# Patient Record
Sex: Female | Born: 1935 | Race: White | Hispanic: No | Smoking: Never smoker
Health system: Southern US, Community
[De-identification: ages and names within clinical notes are randomized; demographics above are authoritative.]

## PROBLEM LIST (undated history)

## (undated) DIAGNOSIS — E785 Hyperlipidemia, unspecified: Secondary | ICD-10-CM

## (undated) DIAGNOSIS — F329 Major depressive disorder, single episode, unspecified: Secondary | ICD-10-CM

## (undated) DIAGNOSIS — I10 Essential (primary) hypertension: Secondary | ICD-10-CM

## (undated) DIAGNOSIS — R42 Dizziness and giddiness: Secondary | ICD-10-CM

## (undated) DIAGNOSIS — I2510 Atherosclerotic heart disease of native coronary artery without angina pectoris: Secondary | ICD-10-CM

## (undated) DIAGNOSIS — F3289 Other specified depressive episodes: Secondary | ICD-10-CM

## (undated) DIAGNOSIS — K5730 Diverticulosis of large intestine without perforation or abscess without bleeding: Secondary | ICD-10-CM

## (undated) DIAGNOSIS — M810 Age-related osteoporosis without current pathological fracture: Secondary | ICD-10-CM

## (undated) DIAGNOSIS — F0390 Unspecified dementia without behavioral disturbance: Secondary | ICD-10-CM

## (undated) DIAGNOSIS — B029 Zoster without complications: Secondary | ICD-10-CM

## (undated) DIAGNOSIS — Z9289 Personal history of other medical treatment: Secondary | ICD-10-CM

## (undated) DIAGNOSIS — F068 Other specified mental disorders due to known physiological condition: Secondary | ICD-10-CM

## (undated) DIAGNOSIS — F411 Generalized anxiety disorder: Secondary | ICD-10-CM

## (undated) DIAGNOSIS — I255 Ischemic cardiomyopathy: Secondary | ICD-10-CM

## (undated) HISTORY — DX: Hyperlipidemia, unspecified: E78.5

## (undated) HISTORY — DX: Dizziness and giddiness: R42

## (undated) HISTORY — DX: Generalized anxiety disorder: F41.1

## (undated) HISTORY — DX: Other specified depressive episodes: F32.89

## (undated) HISTORY — DX: Diverticulosis of large intestine without perforation or abscess without bleeding: K57.30

## (undated) HISTORY — DX: Major depressive disorder, single episode, unspecified: F32.9

## (undated) HISTORY — DX: Age-related osteoporosis without current pathological fracture: M81.0

## (undated) HISTORY — DX: Personal history of other medical treatment: Z92.89

## (undated) HISTORY — DX: Essential (primary) hypertension: I10

## (undated) HISTORY — DX: Zoster without complications: B02.9

## (undated) HISTORY — DX: Other specified mental disorders due to known physiological condition: F06.8

## (undated) HISTORY — DX: Atherosclerotic heart disease of native coronary artery without angina pectoris: I25.10

---
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
## 1999-01-06 ENCOUNTER — Other Ambulatory Visit: Admission: RE | Admit: 1999-01-06 | Discharge: 1999-01-06 | Payer: Self-pay | Admitting: Gynecology

## 1999-04-02 ENCOUNTER — Other Ambulatory Visit: Admission: RE | Admit: 1999-04-02 | Discharge: 1999-04-02 | Payer: Self-pay | Admitting: Gynecology

## 1999-05-12 ENCOUNTER — Other Ambulatory Visit: Admission: RE | Admit: 1999-05-12 | Discharge: 1999-05-12 | Payer: Self-pay | Admitting: Gynecology

## 1999-09-09 ENCOUNTER — Other Ambulatory Visit: Admission: RE | Admit: 1999-09-09 | Discharge: 1999-09-09 | Payer: Self-pay | Admitting: Gynecology

## 2000-01-30 ENCOUNTER — Encounter: Payer: Self-pay | Admitting: Gynecology

## 2000-01-30 ENCOUNTER — Encounter: Admission: RE | Admit: 2000-01-30 | Discharge: 2000-01-30 | Payer: Self-pay | Admitting: Gynecology

## 2000-02-05 ENCOUNTER — Other Ambulatory Visit: Admission: RE | Admit: 2000-02-05 | Discharge: 2000-02-05 | Payer: Self-pay | Admitting: Gynecology

## 2000-02-25 ENCOUNTER — Other Ambulatory Visit: Admission: RE | Admit: 2000-02-25 | Discharge: 2000-02-25 | Payer: Self-pay | Admitting: Gynecology

## 2000-02-25 ENCOUNTER — Encounter (INDEPENDENT_AMBULATORY_CARE_PROVIDER_SITE_OTHER): Payer: Self-pay

## 2000-06-21 ENCOUNTER — Other Ambulatory Visit: Admission: RE | Admit: 2000-06-21 | Discharge: 2000-06-21 | Payer: Self-pay | Admitting: Gynecology

## 2000-12-09 ENCOUNTER — Inpatient Hospital Stay (HOSPITAL_COMMUNITY): Admission: EM | Admit: 2000-12-09 | Discharge: 2000-12-15 | Payer: Self-pay | Admitting: Emergency Medicine

## 2000-12-09 ENCOUNTER — Encounter: Payer: Self-pay | Admitting: Internal Medicine

## 2000-12-09 HISTORY — PX: PTCA: SHX146

## 2000-12-10 ENCOUNTER — Encounter: Payer: Self-pay | Admitting: Internal Medicine

## 2000-12-11 ENCOUNTER — Encounter: Payer: Self-pay | Admitting: Internal Medicine

## 2000-12-12 ENCOUNTER — Encounter: Payer: Self-pay | Admitting: Internal Medicine

## 2000-12-21 ENCOUNTER — Other Ambulatory Visit: Admission: RE | Admit: 2000-12-21 | Discharge: 2000-12-21 | Payer: Self-pay | Admitting: Gynecology

## 2001-01-18 ENCOUNTER — Encounter (HOSPITAL_COMMUNITY): Admission: RE | Admit: 2001-01-18 | Discharge: 2001-04-18 | Payer: Self-pay | Admitting: Internal Medicine

## 2001-02-15 ENCOUNTER — Encounter: Admission: RE | Admit: 2001-02-15 | Discharge: 2001-02-15 | Payer: Self-pay | Admitting: Gynecology

## 2001-02-15 ENCOUNTER — Encounter: Payer: Self-pay | Admitting: Gynecology

## 2001-02-18 ENCOUNTER — Encounter: Admission: RE | Admit: 2001-02-18 | Discharge: 2001-02-18 | Payer: Self-pay | Admitting: Gynecology

## 2001-02-18 ENCOUNTER — Encounter: Payer: Self-pay | Admitting: Gynecology

## 2001-06-23 ENCOUNTER — Other Ambulatory Visit: Admission: RE | Admit: 2001-06-23 | Discharge: 2001-06-23 | Payer: Self-pay | Admitting: Gynecology

## 2002-02-21 ENCOUNTER — Encounter: Payer: Self-pay | Admitting: Gynecology

## 2002-02-21 ENCOUNTER — Encounter: Admission: RE | Admit: 2002-02-21 | Discharge: 2002-02-21 | Payer: Self-pay | Admitting: Gynecology

## 2002-06-28 ENCOUNTER — Other Ambulatory Visit: Admission: RE | Admit: 2002-06-28 | Discharge: 2002-06-28 | Payer: Self-pay | Admitting: Gynecology

## 2002-06-29 ENCOUNTER — Emergency Department (HOSPITAL_COMMUNITY): Admission: EM | Admit: 2002-06-29 | Discharge: 2002-06-29 | Payer: Self-pay | Admitting: *Deleted

## 2003-02-27 ENCOUNTER — Encounter: Admission: RE | Admit: 2003-02-27 | Discharge: 2003-02-27 | Payer: Self-pay | Admitting: Gynecology

## 2003-02-27 ENCOUNTER — Encounter: Payer: Self-pay | Admitting: Gynecology

## 2003-07-03 ENCOUNTER — Other Ambulatory Visit: Admission: RE | Admit: 2003-07-03 | Discharge: 2003-07-03 | Payer: Self-pay | Admitting: Gynecology

## 2004-03-25 ENCOUNTER — Encounter: Admission: RE | Admit: 2004-03-25 | Discharge: 2004-03-25 | Payer: Self-pay | Admitting: Gynecology

## 2004-07-07 ENCOUNTER — Other Ambulatory Visit: Admission: RE | Admit: 2004-07-07 | Discharge: 2004-07-07 | Payer: Self-pay | Admitting: Gynecology

## 2004-11-11 ENCOUNTER — Ambulatory Visit: Payer: Self-pay | Admitting: Cardiology

## 2005-03-05 ENCOUNTER — Ambulatory Visit: Payer: Self-pay | Admitting: Internal Medicine

## 2005-03-19 ENCOUNTER — Ambulatory Visit: Payer: Self-pay

## 2005-04-01 ENCOUNTER — Encounter: Admission: RE | Admit: 2005-04-01 | Discharge: 2005-04-01 | Payer: Self-pay | Admitting: Gynecology

## 2005-06-19 ENCOUNTER — Ambulatory Visit: Payer: Self-pay | Admitting: Internal Medicine

## 2005-07-13 ENCOUNTER — Other Ambulatory Visit: Admission: RE | Admit: 2005-07-13 | Discharge: 2005-07-13 | Payer: Self-pay | Admitting: Gynecology

## 2005-09-10 ENCOUNTER — Ambulatory Visit: Payer: Self-pay | Admitting: Internal Medicine

## 2006-04-02 ENCOUNTER — Encounter: Admission: RE | Admit: 2006-04-02 | Discharge: 2006-04-02 | Payer: Self-pay | Admitting: Gynecology

## 2006-04-09 ENCOUNTER — Ambulatory Visit: Payer: Self-pay | Admitting: Internal Medicine

## 2006-04-21 ENCOUNTER — Ambulatory Visit: Payer: Self-pay | Admitting: Internal Medicine

## 2006-04-22 ENCOUNTER — Ambulatory Visit: Payer: Self-pay | Admitting: Internal Medicine

## 2006-05-07 ENCOUNTER — Ambulatory Visit: Payer: Self-pay | Admitting: Cardiology

## 2006-07-21 ENCOUNTER — Other Ambulatory Visit: Admission: RE | Admit: 2006-07-21 | Discharge: 2006-07-21 | Payer: Self-pay | Admitting: Gynecology

## 2006-09-16 ENCOUNTER — Ambulatory Visit: Payer: Self-pay | Admitting: Internal Medicine

## 2007-04-06 ENCOUNTER — Encounter: Admission: RE | Admit: 2007-04-06 | Discharge: 2007-04-06 | Payer: Self-pay | Admitting: Gynecology

## 2007-04-26 ENCOUNTER — Ambulatory Visit: Payer: Self-pay | Admitting: Internal Medicine

## 2007-04-26 LAB — CONVERTED CEMR LAB
ALT: 21 units/L (ref 0–40)
Albumin: 4 g/dL (ref 3.5–5.2)
Alkaline Phosphatase: 45 units/L (ref 39–117)
BUN: 15 mg/dL (ref 6–23)
Basophils Absolute: 0 10*3/uL (ref 0.0–0.1)
Bilirubin, Direct: 0.1 mg/dL (ref 0.0–0.3)
Eosinophils Absolute: 0.1 10*3/uL (ref 0.0–0.6)
GFR calc Af Amer: 89 mL/min
Glucose, Bld: 101 mg/dL — ABNORMAL HIGH (ref 70–99)
HCT: 43.5 % (ref 36.0–46.0)
LDL Cholesterol: 55 mg/dL (ref 0–99)
Lymphocytes Relative: 24.2 % (ref 12.0–46.0)
MCHC: 33.7 g/dL (ref 30.0–36.0)
Platelets: 236 10*3/uL (ref 150–400)
RDW: 13.6 % (ref 11.5–14.6)
Sodium: 142 meq/L (ref 135–145)
TSH: 1.43 microintl units/mL (ref 0.35–5.50)
Total Bilirubin: 0.8 mg/dL (ref 0.3–1.2)
Total CHOL/HDL Ratio: 2.2
Triglycerides: 86 mg/dL (ref 0–149)
Vit D, 1,25-Dihydroxy: 44 (ref 20–57)
WBC: 7 10*3/uL (ref 4.5–10.5)

## 2007-05-11 ENCOUNTER — Ambulatory Visit: Payer: Self-pay

## 2007-05-23 ENCOUNTER — Ambulatory Visit: Payer: Self-pay | Admitting: Cardiovascular Disease

## 2007-05-30 ENCOUNTER — Ambulatory Visit: Payer: Self-pay | Admitting: Cardiology

## 2007-06-06 ENCOUNTER — Ambulatory Visit: Payer: Self-pay | Admitting: Cardiovascular Disease

## 2007-06-23 ENCOUNTER — Ambulatory Visit: Payer: Self-pay | Admitting: Cardiovascular Disease

## 2007-06-23 LAB — CONVERTED CEMR LAB
BUN: 15 mg/dL (ref 6–23)
Chloride: 105 meq/L (ref 96–112)
Creatinine, Ser: 0.8 mg/dL (ref 0.4–1.2)
GFR calc non Af Amer: 74 mL/min
Glucose, Bld: 110 mg/dL — ABNORMAL HIGH (ref 70–99)
Sodium: 141 meq/L (ref 135–145)

## 2007-07-28 ENCOUNTER — Other Ambulatory Visit: Admission: RE | Admit: 2007-07-28 | Discharge: 2007-07-28 | Payer: Self-pay | Admitting: Gynecology

## 2007-09-24 ENCOUNTER — Ambulatory Visit: Payer: Self-pay | Admitting: Internal Medicine

## 2007-09-26 ENCOUNTER — Encounter: Payer: Self-pay | Admitting: *Deleted

## 2007-09-26 DIAGNOSIS — M810 Age-related osteoporosis without current pathological fracture: Secondary | ICD-10-CM | POA: Insufficient documentation

## 2007-09-26 DIAGNOSIS — I2510 Atherosclerotic heart disease of native coronary artery without angina pectoris: Secondary | ICD-10-CM | POA: Insufficient documentation

## 2007-10-25 ENCOUNTER — Ambulatory Visit: Payer: Self-pay | Admitting: Internal Medicine

## 2007-10-25 DIAGNOSIS — K5730 Diverticulosis of large intestine without perforation or abscess without bleeding: Secondary | ICD-10-CM | POA: Insufficient documentation

## 2007-10-25 DIAGNOSIS — E785 Hyperlipidemia, unspecified: Secondary | ICD-10-CM | POA: Insufficient documentation

## 2007-10-25 DIAGNOSIS — I10 Essential (primary) hypertension: Secondary | ICD-10-CM | POA: Insufficient documentation

## 2008-04-10 ENCOUNTER — Encounter: Admission: RE | Admit: 2008-04-10 | Discharge: 2008-04-10 | Payer: Self-pay | Admitting: Gynecology

## 2008-04-16 ENCOUNTER — Ambulatory Visit: Payer: Self-pay | Admitting: Internal Medicine

## 2008-04-16 DIAGNOSIS — R413 Other amnesia: Secondary | ICD-10-CM

## 2008-04-16 DIAGNOSIS — R42 Dizziness and giddiness: Secondary | ICD-10-CM | POA: Insufficient documentation

## 2008-05-29 ENCOUNTER — Ambulatory Visit: Payer: Self-pay | Admitting: Internal Medicine

## 2008-05-29 LAB — CONVERTED CEMR LAB
ALT: 14 units/L (ref 0–35)
Albumin: 3.6 g/dL (ref 3.5–5.2)
Alkaline Phosphatase: 51 units/L (ref 39–117)
Basophils Absolute: 0 10*3/uL (ref 0.0–0.1)
Basophils Relative: 0.6 % (ref 0.0–1.0)
Calcium: 9.4 mg/dL (ref 8.4–10.5)
Eosinophils Absolute: 0.1 10*3/uL (ref 0.0–0.7)
Eosinophils Relative: 2.1 % (ref 0.0–5.0)
Glucose, Bld: 99 mg/dL (ref 70–99)
HCT: 39.6 % (ref 36.0–46.0)
Hemoglobin: 13.9 g/dL (ref 12.0–15.0)
Ketones, ur: NEGATIVE mg/dL
Lymphocytes Relative: 27.9 % (ref 12.0–46.0)
MCHC: 35 g/dL (ref 30.0–36.0)
Monocytes Absolute: 0.7 10*3/uL (ref 0.1–1.0)
Neutro Abs: 3.2 10*3/uL (ref 1.4–7.7)
Neutrophils Relative %: 57.2 % (ref 43.0–77.0)
Specific Gravity, Urine: 1.02 (ref 1.000–1.03)
TSH: 2.39 microintl units/mL (ref 0.35–5.50)
Total Protein: 6.7 g/dL (ref 6.0–8.3)
Triglycerides: 138 mg/dL (ref 0–149)
Urobilinogen, UA: 0.2 (ref 0.0–1.0)
VLDL: 28 mg/dL (ref 0–40)
pH: 6 (ref 5.0–8.0)

## 2008-06-04 ENCOUNTER — Ambulatory Visit: Payer: Self-pay | Admitting: Internal Medicine

## 2008-06-07 ENCOUNTER — Ambulatory Visit: Payer: Self-pay | Admitting: Cardiology

## 2008-07-09 ENCOUNTER — Ambulatory Visit: Payer: Self-pay | Admitting: Cardiology

## 2008-07-09 LAB — CONVERTED CEMR LAB
AST: 22 units/L (ref 0–37)
Alkaline Phosphatase: 47 units/L (ref 39–117)
BUN: 17 mg/dL (ref 6–23)
Bilirubin, Direct: 0.1 mg/dL (ref 0.0–0.3)
CO2: 29 meq/L (ref 19–32)
Calcium: 8.9 mg/dL (ref 8.4–10.5)
Chloride: 110 meq/L (ref 96–112)
Cholesterol: 133 mg/dL (ref 0–200)
GFR calc Af Amer: 78 mL/min
GFR calc non Af Amer: 64 mL/min
HDL: 49 mg/dL (ref >39.0)
LDL Cholesterol: 66 mg/dL (ref 0–99)
Sodium: 143 meq/L (ref 135–145)
Total Bilirubin: 0.8 mg/dL (ref 0.3–1.2)

## 2008-08-01 ENCOUNTER — Encounter: Payer: Self-pay | Admitting: Internal Medicine

## 2008-08-27 ENCOUNTER — Ambulatory Visit: Payer: Self-pay | Admitting: Internal Medicine

## 2008-09-03 ENCOUNTER — Ambulatory Visit: Payer: Self-pay | Admitting: Cardiology

## 2008-12-03 ENCOUNTER — Ambulatory Visit: Payer: Self-pay | Admitting: Internal Medicine

## 2008-12-03 LAB — CONVERTED CEMR LAB
BUN: 14 mg/dL (ref 6–23)
Bilirubin, Direct: 0.1 mg/dL (ref 0.0–0.3)
CO2: 30 meq/L (ref 19–32)
Chloride: 106 meq/L (ref 96–112)
Cholesterol: 137 mg/dL (ref 0–200)
Creatinine, Ser: 0.7 mg/dL (ref 0.4–1.2)
Potassium: 4.8 meq/L (ref 3.5–5.1)
Triglycerides: 101 mg/dL (ref 0–149)

## 2008-12-05 ENCOUNTER — Ambulatory Visit: Payer: Self-pay | Admitting: Internal Medicine

## 2008-12-05 DIAGNOSIS — F4321 Adjustment disorder with depressed mood: Secondary | ICD-10-CM

## 2009-04-18 ENCOUNTER — Encounter: Admission: RE | Admit: 2009-04-18 | Discharge: 2009-04-18 | Payer: Self-pay | Admitting: Gynecology

## 2009-05-23 ENCOUNTER — Ambulatory Visit: Payer: Self-pay | Admitting: Internal Medicine

## 2009-05-23 DIAGNOSIS — B029 Zoster without complications: Secondary | ICD-10-CM | POA: Insufficient documentation

## 2009-06-18 ENCOUNTER — Telehealth (INDEPENDENT_AMBULATORY_CARE_PROVIDER_SITE_OTHER): Payer: Self-pay | Admitting: *Deleted

## 2009-07-11 ENCOUNTER — Encounter (INDEPENDENT_AMBULATORY_CARE_PROVIDER_SITE_OTHER): Payer: Self-pay | Admitting: *Deleted

## 2009-08-07 ENCOUNTER — Encounter: Payer: Self-pay | Admitting: Internal Medicine

## 2009-08-19 ENCOUNTER — Telehealth: Payer: Self-pay | Admitting: Internal Medicine

## 2009-08-19 ENCOUNTER — Ambulatory Visit: Payer: Self-pay | Admitting: Internal Medicine

## 2009-08-20 LAB — CONVERTED CEMR LAB
ALT: 17 units/L (ref 0–35)
AST: 17 units/L (ref 0–37)
Albumin: 3.8 g/dL (ref 3.5–5.2)
Alkaline Phosphatase: 50 units/L (ref 39–117)
BUN: 16 mg/dL (ref 6–23)
Basophils Absolute: 0 10*3/uL (ref 0.0–0.1)
Basophils Relative: 0.9 % (ref 0.0–3.0)
Bilirubin Urine: NEGATIVE
Bilirubin, Direct: 0.1 mg/dL (ref 0.0–0.3)
CO2: 30 meq/L (ref 19–32)
Calcium: 9.5 mg/dL (ref 8.4–10.5)
Chloride: 105 meq/L (ref 96–112)
Cholesterol: 129 mg/dL (ref 0–200)
Creatinine, Ser: 0.7 mg/dL (ref 0.4–1.2)
Eosinophils Absolute: 0.1 10*3/uL (ref 0.0–0.7)
Eosinophils Relative: 1.8 % (ref 0.0–5.0)
GFR calc non Af Amer: 85.57 mL/min (ref >60)
Glucose, Bld: 92 mg/dL (ref 70–99)
HCT: 41.5 % (ref 36.0–46.0)
HDL: 52.4 mg/dL (ref >39.00)
Hemoglobin: 14.2 g/dL (ref 12.0–15.0)
Ketones, ur: NEGATIVE mg/dL
LDL Cholesterol: 57 mg/dL (ref 0–99)
Lymphocytes Relative: 23.2 % (ref 12.0–46.0)
Lymphs Abs: 1.3 10*3/uL (ref 0.7–4.0)
MCHC: 34.3 g/dL (ref 30.0–36.0)
MCV: 89.8 fL (ref 78.0–100.0)
Monocytes Absolute: 0.7 10*3/uL (ref 0.1–1.0)
Monocytes Relative: 12.9 % — ABNORMAL HIGH (ref 3.0–12.0)
Neutro Abs: 3.4 10*3/uL (ref 1.4–7.7)
Neutrophils Relative %: 61.2 % (ref 43.0–77.0)
Nitrite: NEGATIVE
Platelets: 174 10*3/uL (ref 150.0–400.0)
Potassium: 4.9 meq/L (ref 3.5–5.1)
RBC: 4.63 M/uL (ref 3.87–5.11)
RDW: 13.9 % (ref 11.5–14.6)
Sodium: 141 meq/L (ref 135–145)
Specific Gravity, Urine: 1.02 (ref 1.000–1.030)
TSH: 1.86 microintl units/mL (ref 0.35–5.50)
Total Bilirubin: 0.8 mg/dL (ref 0.3–1.2)
Total CHOL/HDL Ratio: 2
Total Protein, Urine: NEGATIVE mg/dL
Total Protein: 7.2 g/dL (ref 6.0–8.3)
Triglycerides: 96 mg/dL (ref 0.0–149.0)
Urine Glucose: NEGATIVE mg/dL
Urobilinogen, UA: 0.2 (ref 0.0–1.0)
VLDL: 19.2 mg/dL (ref 0.0–40.0)
Vitamin B-12: 783 pg/mL (ref 211–911)
WBC: 5.5 10*3/uL (ref 4.5–10.5)
pH: 6 (ref 5.0–8.0)

## 2009-08-22 ENCOUNTER — Ambulatory Visit: Payer: Self-pay | Admitting: Internal Medicine

## 2009-08-22 DIAGNOSIS — N3090 Cystitis, unspecified without hematuria: Secondary | ICD-10-CM

## 2009-10-11 ENCOUNTER — Ambulatory Visit: Payer: Self-pay | Admitting: Cardiology

## 2010-01-21 ENCOUNTER — Telehealth: Payer: Self-pay | Admitting: Cardiology

## 2010-01-21 ENCOUNTER — Ambulatory Visit: Payer: Self-pay | Admitting: Internal Medicine

## 2010-01-27 ENCOUNTER — Ambulatory Visit: Payer: Self-pay | Admitting: Cardiology

## 2010-02-11 ENCOUNTER — Ambulatory Visit: Payer: Self-pay | Admitting: Internal Medicine

## 2010-02-11 LAB — CONVERTED CEMR LAB
BUN: 16 mg/dL (ref 6–23)
Bilirubin Urine: NEGATIVE
CO2: 32 meq/L (ref 19–32)
Calcium: 9.5 mg/dL (ref 8.4–10.5)
Glucose, Bld: 92 mg/dL (ref 70–99)
Ketones, ur: NEGATIVE mg/dL
Sodium: 141 meq/L (ref 135–145)
Specific Gravity, Urine: 1.025 (ref 1.000–1.030)
Total Protein, Urine: NEGATIVE mg/dL
Urobilinogen, UA: 0.2 (ref 0.0–1.0)
pH: 6 (ref 5.0–8.0)

## 2010-02-18 ENCOUNTER — Ambulatory Visit: Payer: Self-pay | Admitting: Internal Medicine

## 2010-04-18 ENCOUNTER — Telehealth: Payer: Self-pay | Admitting: Internal Medicine

## 2010-04-21 ENCOUNTER — Encounter: Admission: RE | Admit: 2010-04-21 | Discharge: 2010-04-21 | Payer: Self-pay | Admitting: Gynecology

## 2010-04-22 ENCOUNTER — Telehealth: Payer: Self-pay | Admitting: Internal Medicine

## 2010-07-11 ENCOUNTER — Telehealth: Payer: Self-pay | Admitting: Internal Medicine

## 2010-08-08 ENCOUNTER — Telehealth: Payer: Self-pay | Admitting: Internal Medicine

## 2010-08-14 ENCOUNTER — Ambulatory Visit: Payer: Self-pay | Admitting: Internal Medicine

## 2010-08-16 LAB — CONVERTED CEMR LAB: Vitamin B-12: 1234 pg/mL — ABNORMAL HIGH (ref 211–911)

## 2010-08-19 ENCOUNTER — Ambulatory Visit: Payer: Self-pay | Admitting: Internal Medicine

## 2010-09-22 ENCOUNTER — Encounter: Payer: Self-pay | Admitting: Internal Medicine

## 2010-10-02 ENCOUNTER — Ambulatory Visit: Payer: Self-pay | Admitting: Cardiology

## 2010-10-02 ENCOUNTER — Encounter: Payer: Self-pay | Admitting: Cardiology

## 2010-10-14 ENCOUNTER — Ambulatory Visit: Payer: Self-pay | Admitting: Internal Medicine

## 2010-10-28 ENCOUNTER — Ambulatory Visit: Payer: Self-pay | Admitting: Internal Medicine

## 2010-10-28 DIAGNOSIS — F411 Generalized anxiety disorder: Secondary | ICD-10-CM

## 2010-10-28 DIAGNOSIS — L570 Actinic keratosis: Secondary | ICD-10-CM

## 2010-10-30 LAB — CONVERTED CEMR LAB
BUN: 18 mg/dL (ref 6–23)
Bilirubin Urine: NEGATIVE
CO2: 30 meq/L (ref 19–32)
Calcium: 9.6 mg/dL (ref 8.4–10.5)
Creatinine, Ser: 0.8 mg/dL (ref 0.4–1.2)
Eosinophils Absolute: 0.1 10*3/uL (ref 0.0–0.7)
Glucose, Bld: 96 mg/dL (ref 70–99)
Lymphocytes Relative: 32.3 % (ref 12.0–46.0)
Lymphs Abs: 1.8 10*3/uL (ref 0.7–4.0)
MCHC: 33.7 g/dL (ref 30.0–36.0)
MCV: 91.2 fL (ref 78.0–100.0)
Monocytes Absolute: 0.7 10*3/uL (ref 0.1–1.0)
Neutro Abs: 3 10*3/uL (ref 1.4–7.7)
Nitrite: NEGATIVE
Potassium: 4.1 meq/L (ref 3.5–5.1)
RDW: 14.8 % — ABNORMAL HIGH (ref 11.5–14.6)
Specific Gravity, Urine: <=1.005 (ref 1.000–1.030)
TSH: 2.22 microintl units/mL (ref 0.35–5.50)
pH: 7 (ref 5.0–8.0)

## 2010-11-01 DIAGNOSIS — F0280 Dementia in other diseases classified elsewhere without behavioral disturbance: Secondary | ICD-10-CM

## 2010-11-01 DIAGNOSIS — G309 Alzheimer's disease, unspecified: Secondary | ICD-10-CM

## 2010-12-17 ENCOUNTER — Ambulatory Visit
Admission: RE | Admit: 2010-12-17 | Discharge: 2010-12-17 | Payer: Self-pay | Source: Home / Self Care | Attending: Internal Medicine | Admitting: Internal Medicine

## 2010-12-30 NOTE — Progress Notes (Signed)
Summary: Bystolic samples  Phone Note Call from Patient Call back at Home Phone [phone]   Summary of Call: Patient left message on triage that she was given samples of Bystolic and is now out. Patient would like to know if more samples could be picked up.  Initial call taken by: Lucious Groves CMA,  July 11, 2010 9:22 AM  Follow-up for Phone Call        Patient notified samples up front for pick up Follow-up by: Lucious Groves CMA,  July 11, 2010 9:26 AM

## 2010-12-30 NOTE — Progress Notes (Signed)
Summary: BYSTOLIC  Phone Note Call from Patient Call back at [facility] Phone [phone]   Summary of Call: Pt was given samples of Bystolic 10mg , oK to update EMR and send in rx?  Initial call taken by: Lamar Sprinkles, CMA,  Apr 18, 2010 11:44 AM  Follow-up for Phone Call        ok thx Follow-up by: Tresa Garter MD,  Apr 18, 2010 1:05 PM  Additional Follow-up for Phone Call Additional follow up Details #1::        Pt informed  Additional Follow-up by: Lamar Sprinkles, CMA,  Apr 18, 2010 3:36 PM    New/Updated Medications: BYSTOLIC 10 MG TABS (NEBIVOLOL HCL) 1 once daily Prescriptions: BYSTOLIC 10 MG TABS (NEBIVOLOL HCL) 1 once daily  #90 x 1   Entered by:   Lamar Sprinkles, CMA   Authorized by:   Tresa Garter MD   Signed by:   Lamar Sprinkles, CMA on 04/18/2010   Method used:   Electronically to        [facility]  [phone]* (retail)       [address]       [place], [place]  [address]       Ph: [phone] or [phone]       Fax: [phone]   RxID:   [id number]

## 2010-12-30 NOTE — Assessment & Plan Note (Signed)
Summary: elevated bp, fatigue / SD   Vital Signs:  Patient profile:   77 year old female Height:      63 inches (160.02 cm) Weight:      123 pounds (55.91 kg) O2 Sat:      97 % on Room air Temp:     97.9 degrees F (36.61 degrees C) oral Pulse rate:   65 / minute BP sitting:   160 / 78  (left arm) Cuff size:   regular  Vitals Entered By: Orlan Leavens (January 21, 2010 4:16 PM)  O2 Flow:  Room air CC: elevated BP/ Fatigue Is Patient Diabetic? No Pain Assessment Patient in pain? no        Primary Care Provider:  Dr. Posey Rea  CC:  elevated BP/ Fatigue.  History of Present Illness: here today with complaint of elevated blood pressure. onset of symptoms was ?3-4 days ago (just noted for certain today when check home BP cuff). course has been gradual onset and now occurs in waxing/waning pattern. symptom characterized as overwhelming fatigue - denies any cold symptoms , fever, HA or ST -  str says spouse thought pt was "staggering few days ago" but not noted since -- none today denies weakness, trouble speaking -  no dizzy or SOB - no CP or edema - no change in medications symptoms improved by nothing. symptoms worsened with activity. no prior hx of same symptoms.   also dtr worried about progressive memory loss - in past 5-6 mos (no recent change in confusion)  Preventive Screening-Counseling & Management  Alcohol-Tobacco     Smoking Status: never  Clinical Review Panels:  Lipid Management   Cholesterol:  129 (08/19/2009)   LDL (bad choesterol):  57 (08/19/2009)   HDL (good cholesterol):  52.40 (08/19/2009)  CBC   WBC:  5.5 (08/19/2009)   RBC:  4.63 (08/19/2009)   Hgb:  14.2 (08/19/2009)   Hct:  41.5 (08/19/2009)   Platelets:  174.0 (08/19/2009)   MCV  89.8 (08/19/2009)   MCHC  34.3 (08/19/2009)   RDW  13.9 (08/19/2009)   PMN:  61.2 (08/19/2009)   Lymphs:  23.2 (08/19/2009)   Monos:  12.9 (08/19/2009)   Eosinophils:  1.8 (08/19/2009)   Basophil:  0.9  (08/19/2009)  Complete Metabolic Panel   Glucose:  92 (08/19/2009)   Sodium:  141 (08/19/2009)   Potassium:  4.9 (08/19/2009)   Chloride:  105 (08/19/2009)   CO2:  30 (08/19/2009)   BUN:  16 (08/19/2009)   Creatinine:  0.7 (08/19/2009)   Albumin:  3.8 (08/19/2009)   Total Protein:  7.2 (08/19/2009)   Calcium:  9.5 (08/19/2009)   Total Bili:  0.8 (08/19/2009)   Alk Phos:  50 (08/19/2009)   SGPT (ALT):  17 (08/19/2009)   SGOT (AST):  17 (08/19/2009)   Current Medications (verified): 1)  Metoprolol Succinate 25 Mg Xr24h-Tab (Metoprolol Succinate) .[name].. 1 Tab Once Daily 2)  Lisinopril 10 Mg Tabs (Lisinopril) .[name].. 1 Tab Once Daily 3)  Aspirin 81 Mg Tbec (Aspirin) .... Take One Tablet By Mouth Daily 4)  Fluoxetine Hcl 10 Mg  Caps (Fluoxetine Hcl) .... Take 1 Capsule By Mouth Daily 5)  Simvastatin 20 Mg Tabs (Simvastatin) .[name].. 1 By Mouth At Bedtime 6)  Triamcinolone Acetonide 0.5 % Crea (Triamcinolone Acetonide) .... Apply Bid To Affected Area 7)  B Complex  Tabs (B Complex Vitamins) .[name].. 1 Tab Once Daily 8)  Vitamin D3 1000 Unit Caps (Cholecalciferol) .... 2 Caps Once Daily 9)  Multivitamins  Tabs (Multiple Vitamin) .[name].. 1 Tab Once Daily  Allergies (verified): 1)  Simvastatin (Simvastatin)  Past History:  Past medical, surgical, family and social histories (including risk factors) reviewed, and no changes noted (except as noted below).  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 Successful percutaneous transluminal coronary angioplasty of the proximal left anterior descending with reduction of 99% narrowing to 30%  with improvement of TIMI grade 1 to TIMI grade 3 flow..12/09/00 T&A.[name]1943  Family History: Reviewed history from 10/04/2009 and no changes required. Family History Hypertension Family History of Coronary Artery Disease:   Social History: Reviewed history from 10/04/2009 and no changes required. Retired Married Never Smoked Regular exercise-yes Alcohol Use - no Drug Use - no  Review of Systems  The patient denies anorexia, fever, weight loss, vision loss, decreased hearing, hoarseness, chest pain, syncope, dyspnea on exertion, peripheral edema, headaches, hemoptysis, abdominal pain, and depression.         also see HPI above. I have reviewed all other systems and they were negative.   Physical Exam  General:  alert, well-developed, well-nourished, and cooperative to examination.   dtr at side Eyes:  vision grossly intact; pupils equal, round and reactive to light.  conjunctiva and lids normal.   wears glasses Ears:  normal pinnae bilaterally, without erythema, swelling, or tenderness to palpation. TMs clear, without effusion, or cerumen impaction. Hearing grossly normal bilaterally  Mouth:  teeth and gums in good repair; mucous membranes moist, without lesions or ulcers. oropharynx clear without exudate, no erythema.  Lungs:  normal respiratory effort, no intercostal retractions or use of accessory muscles; normal breath sounds bilaterally - no crackles and no wheezes.    Heart:  normal rate, regular rhythm, no murmur, and no rub. BLE without edema.  Msk:  No deformity or scoliosis noted of thoracic or lumbar spine.   Neurologic:  alert & oriented X3 and cranial nerves II-XII symetrically intact.  strength normal in all extremities, sensation intact to light touch, and gait normal. speech fluent without dysarthria or aphasia; follows commands with good comprehension.  Skin:  no rashes, vesicles, ulcers, or erythema. No nodules or irregularity to palpation.  Psych:  Oriented X3, memory  intact for recent and remote, normally interactive, good eye contact, not anxious appearing, not depressed appearing, and not agitated.      Impression & Recommendations:  Problem # 1:  FATIGUE (ICD-780.79) nonsp but ++elev BP (new since last OV) - labs reveiwed from last fall - normal - no localizing findings on exam - EKG due to hx MI/CAD - no change from 09/2009 EKG - tx BP (see next) and check head ct look for ?chroinc ischemic change, old CVA, atropy, etc - to cont f/u and survellince with PCP - d/w dtr/pt who agrees -  Orders: EKG w/ Interpretation (93000) Radiology Referral (Radiology)  Problem # 2:  HYPERTENSION (ICD-401.9) accelerated - will start new med - advised on poss SE v. benefits also advised ER or 911 if recurrent "staggering" or other deficits as may be TIA or CVA -  pt/dtr express understanding of same Her updated medication list for this problem includes:    Metoprolol Succinate 25 Mg Xr24h-tab (Metoprolol succinate) .[name]... 1 tab once daily    Lisinopril 10 Mg Tabs (Lisinopril) .Marland KitchenMarland KitchenMarland KitchenMarland Kitchen  1 tab once daily    Amlodipine Besylate 5 Mg Tabs (Amlodipine besylate) .[name]... 1 by mouth once daily  Orders: EKG w/ Interpretation (93000) Prescription Created Electronically [phone]) Radiology Referral (Radiology)  BP today: 160/78 Prior BP: 120/70 (10/11/2009)  Labs Reviewed: K+: 4.9 (08/19/2009) Creat: : 0.7 (08/19/2009)   Chol: 129 (08/19/2009)   HDL: 52.40 (08/19/2009)   LDL: 57 (08/19/2009)   TG: 96.0 (08/19/2009)  Problem # 3:  CORONARY ARTERY DISEASE (ICD-414.00)  Her updated medication list for this problem includes:    Metoprolol Succinate 25 Mg Xr24h-tab (Metoprolol succinate) .[name]... 1 tab once daily    Lisinopril 10 Mg Tabs (Lisinopril) .[name]... 1 tab once daily    Aspirin 81 Mg Tbec (Aspirin) .[name]... Take one tablet by mouth daily    Amlodipine Besylate 5 Mg Tabs (Amlodipine besylate) .[name]... 1 by mouth once daily  Orders: EKG w/ Interpretation (93000) Radiology  Referral (Radiology)  Problem # 4:  MEMORY LOSS (ICD-780.93) Assessment: Unchanged rec f/u on same with PCP -  head ct as above  Complete Medication List: 1)  Metoprolol Succinate 25 Mg Xr24h-tab (Metoprolol succinate) .[name].. 1 tab once daily 2)  Lisinopril 10 Mg Tabs (Lisinopril) .[name].. 1 tab once daily 3)  Aspirin 81 Mg Tbec (Aspirin) .... Take one tablet by mouth daily 4)  Fluoxetine Hcl 10 Mg Caps (Fluoxetine hcl) .... Take 1 capsule by mouth daily 5)  Simvastatin 20 Mg Tabs (Simvastatin) .[name].. 1 by mouth at bedtime 6)  Triamcinolone Acetonide 0.5 % Crea (Triamcinolone acetonide) .... Apply bid to affected area 7)  B Complex Tabs (B complex vitamins) .[name].. 1 tab once daily 8)  Vitamin D3 1000 Unit Caps (Cholecalciferol) .... 2 caps once daily 9)  Multivitamins Tabs (Multiple vitamin) .[name].. 1 tab once daily 10)  Amlodipine Besylate 5 Mg Tabs (Amlodipine besylate) .[name].. 1 by mouth once daily  Patient Instructions: 1)  it was good to see you today. 2)  will start new medication for high blood pressure - amlodipine - your prescription has been electronically submitted to your pharmacy. Please take as directed. Contact our office if you believe you're having problems with the medication(s).  3)  we'll make referral for head ct scan. Our office will contact you regarding this appointment once made.  4)  Please schedule a follow-up appointment with dr. Posey Rea in next 2-3 weeks to recheck blood pressure and symptoms, sooner if problems.  5)  if your symptoms continue to worsen (any head pain or chest pain, fever, etc), or if you are unable take anything by mouth (pills, fluids, etc), you should call 911 or go to the emergency room for further evaluation and treatment.  Prescriptions: AMLODIPINE BESYLATE 5 MG TABS (AMLODIPINE BESYLATE) 1 by mouth once daily  #30 x 3   Entered and Authorized by:   Newt Lukes MD   Signed by:   Newt Lukes MD on 01/21/2010   Method used:    Electronically to        [facility]  [phone]* (retail)       [address]       [place], [place]  [address]       Ph: [phone] or [phone]       Fax: [phone]   RxID:   [id number]

## 2010-12-30 NOTE — Letter (Signed)
Summary: Gretta Cool MD  Gretta Cool MD   Imported By: Lester Milroy 10/01/2010 09:08:34  _____________________________________________________________________  External Attachment:    Type:   Image     Comment:   External Document

## 2010-12-30 NOTE — Assessment & Plan Note (Signed)
Summary: YEARLY./CY    Visit Type:  Follow-up Primary [name]:  Dr. Posey Rea  CC:  no complaints.  History of Present Illness: [name] comes in today for followup of her coronary disease and previous infarct.  He's unremarkably well except for some recent memory loss. She's been on Aricept but has not noted a tremendous improvement.  She denies any angina or ischemic symptoms. She's had no symptoms of TIAs or mini strokes.  She is very compliant with her medications. Her daughter comes with her today. She is followed by our primary care.  Clinical Reports Reviewed:  Cardiac Cath:  12/09/2000: Cardiac Cath Findings:  LEFT VENTRICULOGRAM:  Mildly dilated end-systolic and end-diastolic dimensions.  Overall left ventricular function is moderately impaired. Ejection fraction approximately 30%.  There is akinesis of the distal anterior and apical walls.  Mitral regurgitation 1+ is noted. LV pressure is 113/20, aortic is 113/72, LVEDP equals 31. FINAL RESULTS:  Successful percutaneous transluminal coronary angioplasty of the proximal left anterior descending with reduction of 99% narrowing to 30% with improvement of TIMI grade 1 to TIMI grade 3 flow.  ASSESSMENT AND PLAN:  [name] is a 77 year old white female, with advanced three-vessel coronary artery disease and left ventricular dysfunction.  The patient has undergone emergent percutaneous revascularization of the left anterior descending.  She will be stabilized medically and further assessment made for possible surgical revascularization.  Nuclear Study:  05/11/2007:  Excerise capacity: Poor exercise capacity  Blood Pressure response: Normal blood pressure response  Clinical symptoms: No chest pain or dyspnea  ECG impression: Baseline ECG with flat ST segments. 1mm ST segment depression in lateral leads.  Overall impression: Small anteroapical scar with mild peri-infarct ischemia.  [name]. Eden Emms,  MD   04/19/2001:  Impression: Stress Cardiolite clinically negative, electrically negative for ischemia. Cardiolite scan with anteroseptal and apical scar with minimal peri-infarct ischemia. Note, cannot exclude some soft tissue attenuation (breast) in this region as well. LVF on gating was calculated at 66%.  Pricilla Riffle, MD, [facility]   Current Medications (verified): 1)  Lisinopril 10 Mg Tabs (Lisinopril) .[name].. 1 Tab Once Daily 2)  Aspirin 81 Mg Tbec (Aspirin) .... Take One Tablet By Mouth Daily 3)  Simvastatin 20 Mg Tabs (Simvastatin) .[name].. 1 By Mouth At Bedtime 4)  Triamcinolone Acetonide 0.5 % Crea (Triamcinolone Acetonide) .... Apply Bid To Affected Area 5)  B Complex  Tabs (B Complex Vitamins) .[name].. 1 Tab Once Daily 6)  Vitamin D3 1000 Unit Caps (Cholecalciferol) .... 2 Caps Once Daily 7)  Amlodipine Besylate 5 Mg Tabs (Amlodipine Besylate) .[name].. 1 By Mouth Once Daily 8)  Bystolic 10 Mg Tabs (Nebivolol Hcl) .[name].. 1 Once Daily 9)  Aricept 10 Mg Tabs (Donepezil Hcl) .[name].. 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) CORONARY ARTERY DISEASE (ICD-414.00) HYPERTENSION (ICD-401.9) HYPERLIPIDEMIA (ICD-272.4) DEPRESSION (ICD-311) MEMORY LOSS (ICD-780.93) HERPES ZOSTER (ICD-053.9) VERTIGO (ICD-780.4) DIVERTICULOSIS, COLON (ICD-562.10) OSTEOPOROSIS (ICD-733.00)    Past Surgical History: Last updated: 10/04/2009 Successful percutaneous transluminal coronary angioplasty of the proximal left anterior descending with reduction of 99% narrowing to 30% with improvement of TIMI grade 1 to TIMI grade 3 flow..12/09/00 T&A.[name]1943  Family History: Last updated: 10/04/2009 Family History Hypertension Family History of Coronary Artery Disease:   Social History: Last updated: 10/04/2009 Retired Married Never Smoked Regular  exercise-yes Alcohol Use - no Drug Use - no  Risk  Factors: Exercise: yes (10/25/2007)  Risk Factors: Smoking Status: never (08/19/2010)  Review of Systems       negative other than history of present illness  Vital Signs:  Patient profile:   77 year old female Height:      63 inches Weight:      124 pounds Pulse rate:   60 / minute Pulse rhythm:   regular BP sitting:   102 / 62  (right arm)  Vitals Entered By: Jacquelin Hawking, CMA (October 02, 2010 11:32 AM)  Physical Exam  General:  elderly, in no acute distress Head:  normocephalic and atraumatic Eyes:  PERRLA/EOM intact; conjunctiva and lids normal. Neck:  Neck supple, no JVD. No masses, thyromegaly or abnormal cervical nodes. Chest Wall:  no deformities or breast masses noted Lungs:  Clear bilaterally to auscultation and percussion. Heart:  PMI nondisplaced, regular rate and rhythm, no obvious murmur or bruit Msk:  decreased ROM.   Pulses:  pulses normal in all 4 extremities Extremities:  No clubbing or cyanosis. Neurologic:  Alert and oriented x 3. Skin:  Intact without lesions or rashes. Psych:  Normal affect.   Impression & Recommendations:  Problem # 1:  CORONARY ARTERY DISEASE (ICD-414.00) Assessment Unchanged continue medical therapy Her updated medication list for this problem includes:    Lisinopril 10 Mg Tabs (Lisinopril) .[name]... 1 tab once daily    Aspirin 81 Mg Tbec (Aspirin) .[name]... Take one tablet by mouth daily    Amlodipine Besylate 5 Mg Tabs (Amlodipine besylate) .[name]... 1 by mouth once daily    Bystolic 10 Mg Tabs (Nebivolol hcl) .[name]... 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 1 tab once daily    Aspirin 81 Mg Tbec (Aspirin) .[name]... Take one tablet by mouth daily    Amlodipine Besylate 5 Mg  Tabs (Amlodipine besylate) .[name]... 1 by mouth once daily    Bystolic 10 Mg Tabs (Nebivolol hcl) .[name]... 1 once daily  Patient Instructions: 1)  Your physician recommends that you schedule a follow-up appointment in: 1 year with Dr. Daleen Squibb 2)  Your physician recommends that you continue on your current medications as directed. Please refer to the Current Medication list given to you today.

## 2010-12-30 NOTE — Assessment & Plan Note (Signed)
Summary: CPX /  SECURE HORIZIONS/NWS  #   Vital Signs:  Patient profile:   77 year old female Height:      63 inches Weight:      124 pounds BMI:     22.05 Temp:     98.2 degrees F oral Pulse rate:   68 / minute Pulse rhythm:   regular Resp:     16 per minute BP sitting:   100 / 40  (left arm) Cuff size:   regular  Vitals Entered By: Lanier Prude, CMA(AAMA) (October 28, 2010 10:42 AM) CC: MWV Comments pt c/o dizziness, fatigue X 2-3 wks and 1 skin lesion on forehead   Primary Care Provider:  Dr. Posey Rea  CC:  [facility].  History of Present Illness: The patient presents for a preventive health examination  Patient past medical history, social history, and family history reviewed in detail no significant changes.  Patient is physically active. Depression is negative and mood is good. Hearing is normal, and able to perform activities of daily living. Risk of falling is negligible and home safety has been reviewed and is appropriate. Patient has normal height, weight, and visual acuity. Patient has been counseled on age-appropriate routine health concerns for screening and prevention. Education, counseling done.  C/o memory loss, anxiety, depressed mood The patient presents for a follow up of hypertension, CAD, hyperlipidemia   Preventive Screening-Counseling & Management  Alcohol-Tobacco     Alcohol drinks/day: 0     Smoking Status: never  Caffeine-Diet-Exercise     Caffeine use/day: 1     Does Patient Exercise: no     Depression Counseling: further diagnostic testing and/or other treatment is indicated  Hep-HIV-STD-Contraception     Hepatitis Risk: no risk noted     Dental Visit-last 6 months no     SBE monthly: no     Sun Exposure-Excessive: no  Safety-Violence-Falls     Seat Belt Use: yes     Helmet Use: n/a     Firearms in the Home: firearms in the home     Smoke Detectors: yes     Violence in the Home: no risk noted     Sexual Abuse: no     Fall Risk:  no  Current Medications (verified): 1)  Lisinopril 10 Mg Tabs (Lisinopril) .[name].. 1 Tab Once Daily 2)  Aspirin 81 Mg Tbec (Aspirin) .... Take One Tablet By Mouth Daily 3)  Simvastatin 20 Mg Tabs (Simvastatin) .[name].. 1 By Mouth At Bedtime 4)  Triamcinolone Acetonide 0.5 % Crea (Triamcinolone Acetonide) .... Apply Bid To Affected Area 5)  B Complex  Tabs (B Complex Vitamins) .[name].. 1 Tab Once Daily 6)  Vitamin D3 1000 Unit Caps (Cholecalciferol) .... 2 Caps Once Daily 7)  Amlodipine Besylate 5 Mg Tabs (Amlodipine Besylate) .[name].. 1 By Mouth Once Daily 8)  Bystolic 10 Mg Tabs (Nebivolol Hcl) .[name].. 1 Once Daily 9)  Aricept 10 Mg Tabs (Donepezil Hcl) .[name].. 1 By Mouth Once Daily For Memory  Allergies (verified): 1)  Simvastatin (Simvastatin)  Past History:  Past Surgical History: Last updated: 10/04/2009 Successful percutaneous transluminal coronary angioplasty of the proximal left anterior descending with reduction of 99% narrowing to 30% with improvement of TIMI grade 1 to TIMI grade 3 flow..12/09/00 T&A.[name]1943  Family History: Last updated: 10/04/2009 Family History Hypertension Family History of Coronary Artery Disease:   Social History: Last updated: 10/04/2009 Retired Married Never Smoked Regular exercise-yes Alcohol Use - no Drug Use - no  Past Medical History: MYOCARDIAL  INFARCTION, 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  The patient denies anorexia, fever,  weight loss, weight gain, vision loss, decreased hearing, hoarseness, chest pain, syncope, dyspnea on exertion, peripheral edema, prolonged cough, headaches, hemoptysis, abdominal pain, melena, hematochezia, severe indigestion/heartburn, hematuria, incontinence, genital sores, muscle weakness, suspicious skin lesions, transient blindness, difficulty walking, unusual weight change, abnormal bleeding, enlarged lymph nodes, angioedema, and breast masses.    Physical Exam  General:  alert, well-developed, well-nourished, and cooperative to examination.   dtr at side Head:  Normocephalic and atraumatic without obvious abnormalities. No apparent alopecia or balding. Eyes:  vision grossly intact; pupils equal, round and reactive to light.  conjunctiva and lids normal.   wears glasses Ears:  normal pinnae bilaterally, without erythema, swelling, or tenderness to palpation. TMs clear, without effusion, or cerumen impaction. Hearing grossly normal bilaterally  Nose:  External nasal examination shows no deformity or inflammation. Nasal mucosa are pink and moist without lesions or exudates. Mouth:  teeth and gums in good repair; mucous membranes moist, without lesions or ulcers. oropharynx clear without exudate, no erythema.  Neck:  No deformities, masses, or tenderness noted. Lungs:  normal respiratory effort, no intercostal retractions or use of accessory muscles; normal breath sounds bilaterally - no crackles and no wheezes.    Heart:  normal rate, regular rhythm, no murmur, and no rub. BLE without edema.  Abdomen:  Bowel sounds positive,abdomen soft and non-tender without masses, organomegaly or hernias noted. Msk:  No deformity or scoliosis noted of thoracic or lumbar spine.   Neurologic:  alert & oriented X3 and cranial nerves II-XII symetrically intact.  strength normal in all extremities, sensation intact to light touch, and gait normal. speech fluent without dysarthria or aphasia; follows commands with  good comprehension.  Skin:  AK on forehead Cervical Nodes:  No lymphadenopathy noted Inguinal Nodes:  No significant adenopathy Psych:  Oriented X3, memory intact for recent and remote, normally interactive, good eye contact, not anxious appearing, not depressed appearing, and not agitated.      Impression & Recommendations:  Problem # 1:  HEALTH MAINTENANCE EXAM (ICD-V70.0) Assessment New Overall doing well, age appropriate education and counseling updated and referral for appropriate preventive services done unless declined, immunizations up to date or declined, diet counseling done if overweight, urged to quit smoking if smokes, most recent labs reviewed and current ordered if appropriate, ecg reviewed or declined (interpretation per ECG scanned in the EMR if done); information regarding Medicare Preventation requirements given if appropriate.  I have personally reviewed the Medicare Annual Wellness questionnaire and have noted 1.   The patient's medical and social history 2.   Their use of alcohol, tobacco or illicit drugs 3.   Their current medications and supplements 4.   The patient's functional ability including ADL's, fall risks, home safety risks and hearing or visual             impairment. 5.   Diet and physical activities 6.   Evidence for depression or mood disorders The patients weight, height, BMI and visual  acuity have been recorded in the chart I have made referrals, counseling and provided education to the patient based review of the above and I have provided the pt with a written personalized care plan for preventive services.   Orders: TLB-BMP (Basic Metabolic Panel-BMET) (80048-METABOL) TLB-CBC Platelet - w/Differential (85025-CBCD) TLB-TSH (Thyroid Stimulating Hormone) (84443-TSH) TLB-Udip ONLY (81003-UDIP) Medicare -1st Annual Wellness Visit [phone])  Problem # 2:  CORONARY ARTERY DISEASE (ICD-414.00) Assessment: Unchanged  The following medications were removed  from the medication list:    Lisinopril 10 Mg Tabs (Lisinopril) .[name]... 1 tab once daily Her updated medication list for this problem includes:    Aspirin 81 Mg Tbec (Aspirin) .[name]... Take one tablet by mouth daily    Amlodipine Besylate 5 Mg Tabs (Amlodipine besylate) .[name]... 1 by mouth once daily    Bystolic 10 Mg Tabs (Nebivolol hcl) .[name]... 1 once daily    Lisinopril 5 Mg Tabs (Lisinopril) .[name]... 1 by mouth qd  Problem # 3:  HYPERLIPIDEMIA (ICD-272.4) Assessment: Unchanged  Her updated medication list for this problem includes:    Simvastatin 20 Mg Tabs (Simvastatin) .[name]... 1 by mouth at bedtime  Problem # 4:  HYPERTENSION (ICD-401.9) Assessment: Unchanged  The following medications were removed from the medication list:    Lisinopril 10 Mg Tabs (Lisinopril) .[name]... 1 tab once daily Her updated medication list for this problem includes:    Amlodipine Besylate 5 Mg Tabs (Amlodipine besylate) .[name]... 1 by mouth once daily    Bystolic 10 Mg Tabs (Nebivolol hcl) .[name]... 1 once daily    Lisinopril 5 Mg Tabs (Lisinopril) .[name]... 1 by mouth qd  Problem # 5:  OSTEOPOROSIS (ICD-733.00) Assessment: Unchanged  Problem # 6:  ACTINIC KERATOSIS (ICD-702.0) forehead Assessment: New Procedure: cryo Indication: AK(s) Risks incl. scar(s), incomplete removal, ect.  and benefits discussed    1  lesion(s) on midforehead was/were treated with liqid N2 in usual fasion.  Tolerated well. Compl. none. Wound care instructions given.  Problem # 7:  ANXIETY (ICD-300.00) Assessment: Deteriorated Namenda added. Consider SSRI in the near future  Problem # 8:  DEPRESSION (ICD-311) Assessment: Deteriorated  Problem # 9:  MEMORY LOSS (ICD-780.93) Assessment: Deteriorated On the regimen of medicine(s) reflected in the chart    Complete Medication List: 1)  Aspirin 81 Mg Tbec (Aspirin) .... Take one tablet by mouth daily 2)  Simvastatin 20 Mg Tabs (Simvastatin) .[name].. 1 by mouth at bedtime 3)  Triamcinolone Acetonide  0.5 % Crea (Triamcinolone acetonide) .... Apply bid to affected area 4)  B Complex Tabs (B complex vitamins) .[name].. 1 tab once daily 5)  Vitamin D3 1000 Unit Caps (Cholecalciferol) .... 2 caps once daily 6)  Amlodipine Besylate 5 Mg Tabs (Amlodipine besylate) .[name].. 1 by mouth once daily 7)  Bystolic 10 Mg Tabs (Nebivolol hcl) .[name].. 1 once daily 8)  Aricept 10 Mg Tabs (Donepezil hcl) .[name].. 1 by mouth once daily for memory 9)  Lisinopril 5 Mg Tabs (Lisinopril) .[name].. 1 by mouth qd 10)  Namenda 10 Mg Tabs (Memantine hcl) .[name].. 1 by mouth bid 11)  Namenda 5 Mg Tabs (Memantine hcl) .[name].. 1 by mouth at bedtime x 1 week and then 1 by mouth bid  Other Orders: Cryotherapy/Destruction benign or premalignant lesion (1st lesion)  (17000)  Patient Instructions: 1)  Please schedule a follow-up appointment in 6 weeks. Prescriptions: NAMENDA 5 MG TABS (MEMANTINE HCL) 1 by mouth at bedtime x 1 week and then 1 by mouth bid  #60 x 0  Entered and Authorized by:   Tresa Garter MD   Signed by:   Tresa Garter MD on 10/28/2010   Method used:   Print then Give to Patient   RxID:   [id number] NAMENDA 10 MG TABS (MEMANTINE HCL) 1 by mouth bid  #60 x 11   Entered and Authorized by:   Tresa Garter MD   Signed by:   Tresa Garter MD on 10/28/2010   Method used:   Print then Give to Patient   RxID:   [id number] LISINOPRIL 5 MG TABS (LISINOPRIL) 1 by mouth qd  #30 x 11   Entered and Authorized by:   Tresa Garter MD   Signed by:   Tresa Garter MD on 10/28/2010   Method used:   Print then Give to Patient   RxID:   [id number]    Orders Added: 1)  TLB-BMP (Basic Metabolic Panel-BMET) [80048-METABOL] 2)  TLB-CBC Platelet - w/Differential [85025-CBCD] 3)  TLB-TSH (Thyroid Stimulating Hormone) [84443-TSH] 4)  TLB-Udip ONLY [81003-UDIP] 5)  Medicare -1st Annual Wellness Visit [G0438] 6)  Est. Patient Level IV [01027] 7)  Cryotherapy/Destruction benign or  premalignant lesion (1st lesion)  [17000]   Immunization History:  Influenza Immunization History:    Influenza:  historical (08/13/2010)  Pneumovax Immunization History:    Pneumovax:  historical (05/13/2005)   Immunization History:  Influenza Immunization History:    Influenza:  Historical (08/13/2010)  Pneumovax Immunization History:    Pneumovax:  Historical (05/13/2005)

## 2010-12-30 NOTE — Miscellaneous (Signed)
Summary: [facility]   Imported By: Lester [place] 02/27/2010 11:04:56  _____________________________________________________________________  External Attachment:    Type:   Image     Comment:   External Document

## 2010-12-30 NOTE — Assessment & Plan Note (Signed)
Summary: 6 MTH FU  #  STC   Vital Signs:  Patient profile:   77 year old female Weight:      123 pounds Temp:     98.1 degrees F oral Pulse rate:   71 / minute BP sitting:   122 / 62  (left arm)  Vitals Entered By: Tora Perches (February 18, 2010 9:02 AM) CC: f/u Is Patient Diabetic? No   Primary Care Provider:  Dr. Posey Rea  CC:  f/u.  History of Present Illness: C/o memory issues, fatigue; can't focus well... The patient presents for a follow up of hypertension, hyperlipidemia   Preventive Screening-Counseling & Management  Alcohol-Tobacco     Smoking Status: never  Current Medications (verified): 1)  Metoprolol Succinate 25 Mg Xr24h-Tab (Metoprolol Succinate) .[name].. 1 Tab Once Daily 2)  Lisinopril 10 Mg Tabs (Lisinopril) .[name].. 1 Tab Once Daily 3)  Aspirin 81 Mg Tbec (Aspirin) .... Take One Tablet By Mouth Daily 4)  Fluoxetine Hcl 10 Mg  Caps (Fluoxetine Hcl) .... Take 1 Capsule By Mouth Daily 5)  Simvastatin 20 Mg Tabs (Simvastatin) .[name].. 1 By Mouth At Bedtime 6)  Triamcinolone Acetonide 0.5 % Crea (Triamcinolone Acetonide) .... Apply Bid To Affected Area 7)  B Complex  Tabs (B Complex Vitamins) .[name].. 1 Tab Once Daily 8)  Vitamin D3 1000 Unit Caps (Cholecalciferol) .... 2 Caps Once Daily 9)  Multivitamins   Tabs (Multiple Vitamin) .[name].. 1 Tab Once Daily 10)  Amlodipine Besylate 5 Mg Tabs (Amlodipine Besylate) .[name].. 1 By Mouth Once Daily  Allergies: 1)  Simvastatin (Simvastatin)  Past History:  Past Medical History: Last updated: 01/21/2010 MYOCARDIAL INFARCTION, ANTERIOR WALL/ 11/2000 (ICD-410.10) CORONARY ARTERY DISEASE (ICD-414.00) HYPERTENSION (ICD-401.9) HYPERLIPIDEMIA (ICD-272.4) DEPRESSION (ICD-311) MEMORY LOSS (ICD-780.93) HERPES ZOSTER (ICD-053.9) VERTIGO (ICD-780.4) DIVERTICULOSIS, COLON (ICD-562.10) OSTEOPOROSIS (ICD-733.00)    Past Surgical History: Last updated: 10/04/2009 Successful percutaneous transluminal coronary angioplasty of the proximal  left anterior descending with reduction of 99% narrowing to 30% with improvement of TIMI grade 1 to TIMI grade 3 flow..12/09/00 T&A.[name]1943  Family History: Last updated: 10/04/2009 Family History Hypertension Family History of Coronary Artery Disease:   Social History: Last updated: 10/04/2009 Retired Married Never Smoked Regular exercise-yes Alcohol Use - no Drug Use - no  Review of Systems       The patient complains of decreased hearing.  The patient denies anorexia, fever, weight loss, weight gain, vision loss, hoarseness, chest pain, syncope, dyspnea on exertion, peripheral edema, prolonged cough, headaches, hemoptysis, abdominal pain, melena, hematochezia, severe indigestion/heartburn, hematuria, incontinence, genital sores, muscle weakness, suspicious skin lesions, transient blindness, difficulty walking, depression, unusual weight change, abnormal bleeding, enlarged lymph nodes, angioedema, and breast masses.    Physical Exam  General:  alert, well-developed, well-nourished, and cooperative to examination.   dtr at side Eyes:  vision grossly intact; pupils equal, round and reactive to light.  conjunctiva and lids normal.   wears glasses Ears:  normal pinnae bilaterally, without erythema, swelling, or tenderness to palpation. TMs clear, without effusion, or cerumen impaction. Hearing grossly normal bilaterally  Nose:  External nasal examination shows no deformity or inflammation. Nasal mucosa are pink and moist without lesions or exudates. Mouth:  teeth and gums in good repair; mucous membranes moist, without lesions or ulcers. oropharynx clear without exudate, no erythema.  Lungs:  normal respiratory effort, no intercostal retractions or use of accessory muscles; normal breath sounds bilaterally - no crackles and no wheezes.    Heart:  normal rate, regular rhythm, no murmur,  and no rub. BLE without edema.  Abdomen:  Bowel sounds positive,abdomen soft and non-tender without masses,  organomegaly or hernias noted. Msk:  No deformity or scoliosis noted of thoracic or lumbar spine.   Neurologic:  alert & oriented X3 and cranial nerves II-XII symetrically intact.  strength normal in all extremities, sensation intact to light touch, and gait normal. speech fluent without dysarthria or aphasia; follows commands with good comprehension.  Skin:  no rashes, vesicles, ulcers, or erythema. No nodules or irregularity to palpation.  Psych:  Oriented X3, memory intact for recent and remote, normally interactive, good eye contact, not anxious appearing, not depressed appearing, and not agitated.      Impression & Recommendations:  Problem # 1:  FATIGUE (ICD-780.79) poss related to Rx Assessment Unchanged See "Patient Instructions".   Problem # 2:  MEMORY LOSS (ICD-780.93) SLUMS score 24 (MNCD). The office visit took longer than 45 min with patient councelling for more than 50% of the 45 min due to SLUMS test administration. Results discussed w/pt and her dtr. Hold Toprol - try Bystolic Head CT was nl  Problem # 3:  HYPERTENSION (ICD-401.9) Assessment: Unchanged  Her updated medication list for this problem includes:    Metoprolol Succinate 25 Mg Xr24h-tab (Metoprolol succinate) .[name]... 1 tab once daily    Lisinopril 10 Mg Tabs (Lisinopril) .[name]... 1 tab once daily    Amlodipine Besylate 5 Mg Tabs (Amlodipine besylate) .[name]... 1 by mouth once daily  Problem # 4:  HYPERLIPIDEMIA (ICD-272.4) Assessment: Unchanged  Her updated medication list for this problem includes:    Simvastatin 20 Mg Tabs (Simvastatin) .[name]... 1 by mouth at bedtime  Complete Medication List: 1)  Metoprolol Succinate 25 Mg Xr24h-tab (Metoprolol succinate) .[name].. 1 tab once daily 2)  Lisinopril 10 Mg Tabs (Lisinopril) .[name].. 1 tab once daily 3)  Aspirin 81 Mg Tbec (Aspirin) .... Take one tablet by mouth daily 4)  Fluoxetine Hcl 10 Mg Caps (Fluoxetine hcl) .... Take 1 capsule by mouth daily 5)  Simvastatin 20 Mg Tabs  (Simvastatin) .[name].. 1 by mouth at bedtime 6)  Triamcinolone Acetonide 0.5 % Crea (Triamcinolone acetonide) .... Apply bid to affected area 7)  B Complex Tabs (B complex vitamins) .[name].. 1 tab once daily 8)  Vitamin D3 1000 Unit Caps (Cholecalciferol) .... 2 caps once daily 9)  Amlodipine Besylate 5 Mg Tabs (Amlodipine besylate) .[name].. 1 by mouth once daily  Patient Instructions: 1)  Try Bystolic  10 mg once daily instead of Toprol 2)  Please schedule a follow-up appointment in 6 months. 3)  Vit B12 4)  ESR 782.0 995.20

## 2010-12-30 NOTE — Progress Notes (Signed)
Summary: SLUMS examination   SLUMS examination   Imported By: Sherian Rein 02/21/2010 09:57:53  _____________________________________________________________________  External Attachment:    Type:   Image     Comment:   External Document

## 2010-12-30 NOTE — Progress Notes (Signed)
Summary: PT HAVBE NO ENERGY AND B/P RUNNING HIGH   Phone Note Call from Patient Call back at Home Phone [phone]   Caller: Daughter/[name] Summary of Call: PT HAS NO ENERGY AND B/P IS RUNNING HIGH 172/90 TOOK AROUND 1:30 TODAY Initial call taken by: Judie Grieve,  January 21, 2010 1:40 PM  Follow-up for Phone Call        SPOKE WITH PT B/P  RUNNING HIGH TODAY RANGING FROM   [id number] B/P OKAY YESTERDAY  WORRY RE  ELEVATED B/P  " DOESN'T  WANT TO HAVE STROKE". ALSO C/O FATIGUE  FOR 3-4 DAYS NO OTHER C/O AT THIS TIME.  PER PT HAS APPT WITH DR PLOTNIKOV 02/18/10 AT 9:00. PLEASE ADVISE. Follow-up by: Scherrie Bateman, LPN,  January 21, 2010 2:24 PM  Additional Follow-up for Phone Call Additional follow up Details #1::        i saw at [facility] today - see note - f/u with [facility] as planned - thanks Additional Follow-up by: Newt Lukes MD,  January 21, 2010 5:00 PM    Additional Follow-up for Phone Call Additional follow up Details #2::    Thanks Valerie Follow-up by: Gaylord Shih, MD, [facility],  January 22, 2010 9:05 AM

## 2010-12-30 NOTE — Progress Notes (Signed)
  Phone Note Refill Request Message from:  Fax from Pharmacy on Apr 22, 2010 1:32 PM  Refills Requested: Medication #1:  BYSTOLIC 10 MG TABS 1 once daily. Initial call taken by: Ami Bullins CMA,  Apr 22, 2010 1:32 PM    Prescriptions: BYSTOLIC 10 MG TABS (NEBIVOLOL HCL) 1 once daily  #2 month supp x 0   Entered by:   Ami Bullins CMA   Authorized by:   Jacques Navy MD   Signed by:   Bill Salinas CMA on 04/22/2010   Method used:   Samples Given   RxID:   [id number]

## 2010-12-30 NOTE — Progress Notes (Signed)
Summary: Samples  Phone Note Call from Patient   Summary of Call: Patient is requesting samples of bystolic.  Initial call taken by: Lamar Sprinkles, CMA,  August 08, 2010 10:48 AM    Prescriptions: BYSTOLIC 10 MG TABS (NEBIVOLOL HCL) 1 once daily  #2 month supp x 0   Entered by:   Lamar Sprinkles, CMA   Authorized by:   Tresa Garter MD   Signed by:   Lamar Sprinkles, CMA on 08/08/2010   Method used:   Samples Given   RxID:   [id number]

## 2010-12-30 NOTE — Assessment & Plan Note (Signed)
Summary: 6 mth fu/flu shot--stc   Vital Signs:  Patient profile:   77 year old female Height:      63 inches Weight:      124 pounds BMI:     22.05 Temp:     97.3 degrees F oral Pulse rate:   76 / minute Pulse rhythm:   regular Resp:     16 per minute BP sitting:   104 / 72  (left arm) Cuff size:   regular  Vitals Entered By: Lanier Prude, CMA(AAMA) (August 19, 2010 10:07 AM) CC: 6 mo f/u  c/o memeory loss Is Patient Diabetic? No   Primary Care Provider:  Dr. Posey Rea  CC:  6 mo f/u  c/o memeory loss.  History of Present Illness: The patient presents for a follow up of memory loss, anxiety, CAD  Preventive Screening-Counseling & Management  Alcohol-Tobacco     Smoking Status: never  Current Medications (verified): 1)  Metoprolol Succinate 25 Mg Xr24h-Tab (Metoprolol Succinate) .[name].. 1 Tab Once Daily 2)  Lisinopril 10 Mg Tabs (Lisinopril) .[name].. 1 Tab Once Daily 3)  Aspirin 81 Mg Tbec (Aspirin) .... Take One Tablet By Mouth Daily 4)  Fluoxetine Hcl 10 Mg  Caps (Fluoxetine Hcl) .... Take 1 Capsule By Mouth Daily 5)  Simvastatin 20 Mg Tabs (Simvastatin) .[name].. 1 By Mouth At Bedtime 6)  Triamcinolone Acetonide 0.5 % Crea (Triamcinolone Acetonide) .... Apply Bid To Affected Area 7)  B Complex  Tabs (B Complex Vitamins) .[name].. 1 Tab Once Daily 8)  Vitamin D3 1000 Unit Caps (Cholecalciferol) .... 2 Caps Once Daily 9)  Amlodipine Besylate 5 Mg Tabs (Amlodipine Besylate) .[name].. 1 By Mouth Once Daily 10)  Bystolic 10 Mg Tabs (Nebivolol Hcl) .[name].. 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) CORONARY ARTERY DISEASE (ICD-414.00) HYPERTENSION (ICD-401.9) HYPERLIPIDEMIA (ICD-272.4) DEPRESSION (ICD-311) MEMORY LOSS (ICD-780.93) HERPES ZOSTER (ICD-053.9) VERTIGO (ICD-780.4) DIVERTICULOSIS, COLON (ICD-562.10) OSTEOPOROSIS (ICD-733.00)    Past Surgical History: Last updated: 10/04/2009 Successful percutaneous transluminal coronary angioplasty of the proximal left anterior descending with reduction of 99% narrowing to 30% with improvement of TIMI grade 1 to TIMI grade 3 flow..12/09/00 T&A.[name]1943  Family History: Last updated: 10/04/2009 Family History Hypertension Family History of Coronary Artery Disease:   Social History: Last updated: 10/04/2009 Retired Married Never Smoked Regular exercise-yes Alcohol Use - no Drug Use - no  Review of Systems       The patient complains of dyspnea on exertion, abdominal pain, muscle weakness, difficulty walking, and depression.  The patient denies fever and chest pain.    Physical Exam  General:  alert, well-developed, well-nourished, and cooperative to examination.   dtr at side Head:  Normocephalic and atraumatic without obvious abnormalities. No apparent alopecia or balding. Ears:  normal pinnae bilaterally, without erythema, swelling, or tenderness to palpation. TMs clear, without effusion, or cerumen impaction. Hearing grossly normal bilaterally  Nose:  External nasal examination shows no deformity or inflammation. Nasal mucosa are pink and moist without lesions or exudates. Mouth:  teeth and gums in good repair; mucous membranes moist, without lesions or ulcers. oropharynx clear without exudate, no erythema.  Lungs:  normal respiratory effort, no intercostal retractions or use of accessory muscles; normal breath sounds bilaterally - no crackles and no wheezes.    Heart:  normal rate, regular rhythm, no murmur, and no rub. BLE without edema.  Abdomen:  Bowel sounds positive,abdomen soft  and non-tender without masses, organomegaly or hernias noted. Msk:  No deformity or scoliosis noted of thoracic or lumbar spine.   Neurologic:  alert & oriented X3 and cranial nerves II-XII symetrically intact.  strength normal in all extremities, sensation intact to light touch, and gait  normal. speech fluent without dysarthria or aphasia; follows commands with good comprehension.  Skin:  no rashes, vesicles, ulcers, or erythema. No nodules or irregularity to palpation.  Psych:  Oriented X3, memory intact for recent and remote, normally interactive, good eye contact, not anxious appearing, not depressed appearing, and not agitated.      Impression & Recommendations:  Problem # 1:  MEMORY LOSS (ICD-780.93) Assessment Unchanged Start Aricept  Problem # 2:  MYOCARDIAL INFARCTION, ANTERIOR WALL/ 11/2000 (ICD-410.10) Assessment: Comment Only  The following medications were removed from the medication list:    Metoprolol Succinate 25 Mg Xr24h-tab (Metoprolol succinate) .[name]... 1 tab once daily Her updated medication list for this problem includes:    Lisinopril 10 Mg Tabs (Lisinopril) .[name]... 1 tab once daily    Aspirin 81 Mg Tbec (Aspirin) .[name]... Take one tablet by mouth daily    Amlodipine Besylate 5 Mg Tabs (Amlodipine besylate) .[name]... 1 by mouth once daily    Bystolic 10 Mg Tabs (Nebivolol hcl) .[name]... 1 once daily  Problem # 3:  DEPRESSION (ICD-311) Assessment: Unchanged  The following medications were removed from the medication list:    Fluoxetine Hcl 10 Mg Caps (Fluoxetine hcl) .[name]... Take 1 capsule by mouth daily  Problem # 4:  CORONARY ARTERY DISEASE (ICD-414.00) Assessment: Unchanged  The following medications were removed from the medication list:    Metoprolol Succinate 25 Mg Xr24h-tab (Metoprolol succinate) .[name]... 1 tab once daily Her updated medication list for this problem includes:    Lisinopril 10 Mg Tabs (Lisinopril) .[name]... 1 tab once daily    Aspirin 81 Mg Tbec (Aspirin) .[name]... Take one tablet by mouth daily    Amlodipine Besylate 5 Mg Tabs (Amlodipine besylate) .[name]... 1 by mouth once daily    Bystolic 10 Mg Tabs (Nebivolol hcl) .[name]... 1 once daily  Complete Medication List: 1)  Lisinopril 10 Mg Tabs (Lisinopril) .[name].. 1 tab once daily 2)  Aspirin 81 Mg Tbec  (Aspirin) .... Take one tablet by mouth daily 3)  Simvastatin 20 Mg Tabs (Simvastatin) .[name].. 1 by mouth at bedtime 4)  Triamcinolone Acetonide 0.5 % Crea (Triamcinolone acetonide) .... Apply bid to affected area 5)  B Complex Tabs (B complex vitamins) .[name].. 1 tab once daily 6)  Vitamin D3 1000 Unit Caps (Cholecalciferol) .... 2 caps once daily 7)  Amlodipine Besylate 5 Mg Tabs (Amlodipine besylate) .[name].. 1 by mouth once daily 8)  Bystolic 10 Mg Tabs (Nebivolol hcl) .[name].. 1 once daily 9)  Aricept 10 Mg Tabs (Donepezil hcl) .[name].. 1 by mouth once daily for memory  Patient Instructions: 1)  Please schedule a follow-up appointment in 2 months well w/labs. Prescriptions: BYSTOLIC 10 MG TABS (NEBIVOLOL HCL) 1 once daily  #60 x 3   Entered and Authorized by:   Tresa Garter MD   Signed by:   Tresa Garter MD on 08/19/2010   Method used:   Print then Give to Patient   RxID:   [id number] ARICEPT 10 MG TABS (DONEPEZIL HCL) 1 by mouth once daily for memory  #30 x 12   Entered and Authorized by:   Tresa Garter MD   Signed by:   Tresa Garter MD on 08/19/2010  Method used:   Print then Give to Patient   RxID:   [id number]

## 2011-01-01 NOTE — Assessment & Plan Note (Signed)
Summary: 6 week follow up-lb   Vital Signs:  Patient profile:   77 year old female Height:      63 inches Weight:      125 pounds BMI:     22.22 Temp:     97.9 degrees F oral Pulse rate:   76 / minute Pulse rhythm:   regular Resp:     16 per minute BP sitting:   130 / 60  (left arm) Cuff size:   regular  Vitals Entered By: Lanier Prude, CMA(AAMA) (December 17, 2010 1:35 PM) CC: 6 wk f/u c/o runny nose Is Patient Diabetic? No   Primary Care Provider:  Dr. Posey Rea  CC:  6 wk f/u c/o runny nose.  History of Present Illness: The patient presents for a follow up of hypertension, diabetes, hyperlipidemia, dementia. She has apathy. Memory is not better. She chose to take Nmenda once daily.   Current Medications (verified): 1)  Aspirin 81 Mg Tbec (Aspirin) .... Take One Tablet By Mouth Daily 2)  Simvastatin 20 Mg Tabs (Simvastatin) .[name].. 1 By Mouth At Bedtime 3)  Triamcinolone Acetonide 0.5 % Crea (Triamcinolone Acetonide) .... Apply Bid To Affected Area 4)  B Complex  Tabs (B Complex Vitamins) .[name].. 1 Tab Once Daily 5)  Vitamin D3 1000 Unit Caps (Cholecalciferol) .... 2 Caps Once Daily 6)  Amlodipine Besylate 5 Mg Tabs (Amlodipine Besylate) .[name].. 1 By Mouth Once Daily 7)  Bystolic 10 Mg Tabs (Nebivolol Hcl) .[name].. 1 Once Daily 8)  Aricept 10 Mg Tabs (Donepezil Hcl) .[name].. 1 By Mouth Once Daily For Memory 9)  Lisinopril 5 Mg Tabs (Lisinopril) .[name].. 1 By Mouth Qd 10)  Namenda 10 Mg Tabs (Memantine Hcl) .[name].. 1 By Mouth Bid 11)  Namenda 5 Mg Tabs (Memantine Hcl) .[name].. 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         Sad  Physical Exam  General:  alert, well-developed, well-nourished, and cooperative to examination.   dtr at side Nose:  External nasal examination shows no deformity or inflammation. Nasal mucosa are pink and moist without lesions or exudates. Mouth:  teeth and gums in good repair; mucous membranes moist, without lesions or ulcers. oropharynx clear without exudate, no erythema.  Lungs:  normal respiratory effort, no intercostal retractions or use of accessory muscles; normal breath sounds bilaterally - no crackles and no wheezes.    Heart:  normal rate, regular rhythm, no murmur, and no rub. BLE without edema.  Abdomen:  Bowel sounds positive,abdomen soft and non-tender without masses, organomegaly or hernias noted. Msk:  No deformity or scoliosis noted of thoracic or lumbar spine.   Neurologic:  alert & oriented X3 and cranial nerves II-XII symetrically intact.  strength normal in all extremities, sensation intact to light touch, and gait normal. speech fluent without dysarthria or aphasia; follows commands with good comprehension.  Skin:  AK on forehead Psych:  Oriented X3, memory intact for recent  and remote, normally interactive, good eye contact, not anxious appearing, not depressed appearing, and not agitated.   Sad   Impression & Recommendations:  Problem # 1:  DEMENTIA (ICD-294.8) Assessment Unchanged Increase Namenda to bid  Problem # 2:  MEMORY LOSS (ICD-780.93) Assessment: Unchanged as above treat #4  Problem # 3:  CORONARY ARTERY DISEASE (ICD-414.00) Assessment: Unchanged  Her updated medication list  for this problem includes:    Aspirin 81 Mg Tbec (Aspirin) .[name]... Take one tablet by mouth daily    Amlodipine Besylate 5 Mg Tabs (Amlodipine besylate) .[name]... 1 by mouth once daily    Bystolic 10 Mg Tabs (Nebivolol hcl) .[name]... 1 once daily    Lisinopril 5 Mg Tabs (Lisinopril) .[name]... 1 by mouth qd  Problem # 4:  DEPRESSION (ICD-311) Assessment: New  Her updated medication list for this problem includes:    Wellbutrin Sr 150 Mg Xr12h-tab (Bupropion hcl) .[name]... 1 by mouth q am  Problem # 5:  FATIGUE (ICD-780.79) Assessment: Unchanged  Complete Medication List: 1)  Aspirin 81 Mg Tbec (Aspirin) .... Take one tablet by mouth daily 2)  Simvastatin 20 Mg Tabs (Simvastatin) .[name].. 1 by mouth at bedtime 3)  Triamcinolone Acetonide 0.5 % Crea (Triamcinolone acetonide) .... Apply bid to affected area 4)  B Complex Tabs (B complex vitamins) .[name].. 1 tab once daily 5)  Vitamin D3 1000 Unit Caps (Cholecalciferol) .... 2 caps once daily 6)  Amlodipine Besylate 5 Mg Tabs (Amlodipine besylate) .[name].. 1 by mouth once daily 7)  Bystolic 10 Mg Tabs (Nebivolol hcl) .[name].. 1 once daily 8)  Aricept 10 Mg Tabs (Donepezil hcl) .[name].. 1 by mouth once daily for memory 9)  Lisinopril 5 Mg Tabs (Lisinopril) .[name].. 1 by mouth qd 10)  Namenda 10 Mg Tabs (Memantine hcl) .[name].. 1 by mouth bid 11)  Wellbutrin Sr 150 Mg Xr12h-tab (Bupropion hcl) .[name].. 1 by mouth q am  Patient Instructions: 1)  Please schedule a follow-up appointment in 3 months. Prescriptions: WELLBUTRIN SR 150 MG XR12H-TAB (BUPROPION HCL) 1 by mouth q am  #90 x 1   Entered and Authorized by:   Tresa Garter MD   Signed by:   Tresa Garter MD on 12/17/2010   Method used:   Print then Give to Patient   RxID:   [id number] WELLBUTRIN SR 150 MG XR12H-TAB (BUPROPION HCL) 1 by mouth q am  #30 x 6   Entered and Authorized by:   Tresa Garter MD   Signed by:   Tresa Garter MD on 12/17/2010   Method used:   Print then Give to Patient   RxID:    [id number]    Orders Added: 1)  Est. Patient Level IV [66063]

## 2011-02-16 ENCOUNTER — Encounter: Payer: Self-pay | Admitting: Internal Medicine

## 2011-02-16 ENCOUNTER — Ambulatory Visit (INDEPENDENT_AMBULATORY_CARE_PROVIDER_SITE_OTHER): Payer: Self-pay | Admitting: Internal Medicine

## 2011-02-16 DIAGNOSIS — J309 Allergic rhinitis, unspecified: Secondary | ICD-10-CM

## 2011-02-26 NOTE — Assessment & Plan Note (Signed)
Summary: SINUS / COUGH / NO ENERGY /AVP PT / AVP IS FULL /NWS   Vital Signs:  Patient profile:   77 year old female Weight:      122.4 pounds (55.64 kg) O2 Sat:      98 % on Room air Temp:     97.8 degrees F (36.56 degrees C) oral Pulse rate:   63 / minute BP sitting:   110 / 72  (left arm) Cuff size:   regular  Vitals Entered By: Orlan Leavens RMA (February 16, 2011 10:40 AM)  O2 Flow:  Room air CC: Sinus drainage/ cough, URI symptoms Is Patient Diabetic? No Pain Assessment Patient in pain? no      Comments Also pt states she has no energy & feel lightheaded   Primary Care Provider:  Dr. Posey Rea  CC:  Sinus drainage/ cough and URI symptoms.  History of Present Illness:  URI Symptoms      This is an 77 year old woman who presents with URI symptoms.  The symptoms began 2 weeks ago.  The severity is described as moderate.  uses otc allg med x 4 days and feels "wornout and tired" each AM - uncertain what med being used.  The patient reports nasal congestion, clear nasal discharge, and dry cough, but denies purulent nasal discharge, sore throat, productive cough, earache, and sick contacts.  The patient denies fever, stiff neck, dyspnea, wheezing, rash, vomiting, diarrhea, and use of an antipyretic.  The patient also reports itchy watery eyes, itchy throat, sneezing, and severe fatigue.  The patient denies response to antihistamine, headache, and muscle aches.  The patient denies the following risk factors for Strep sinusitis: unilateral facial pain, unilateral nasal discharge, tooth pain, and tender adenopathy.    Clinical Review Panels:  CBC   WBC:  5.7 (10/28/2010)   RBC:  4.44 (10/28/2010)   Hgb:  13.6 (10/28/2010)   Hct:  40.5 (10/28/2010)   Platelets:  202.0 (10/28/2010)   MCV  91.2 (10/28/2010)   MCHC  33.7 (10/28/2010)   RDW  14.8 (10/28/2010)   PMN:  52.4 (10/28/2010)   Lymphs:  32.3 (10/28/2010)   Monos:  13.0 (10/28/2010)   Eosinophils:  1.7 (10/28/2010)  Basophil:  0.6 (10/28/2010)  Complete Metabolic Panel   Glucose:  96 (10/28/2010)   Sodium:  139 (10/28/2010)   Potassium:  4.1 (10/28/2010)   Chloride:  102 (10/28/2010)   CO2:  30 (10/28/2010)   BUN:  18 (10/28/2010)   Creatinine:  0.8 (10/28/2010)   Albumin:  3.8 (08/19/2009)   Total Protein:  7.2 (08/19/2009)   Calcium:  9.6 (10/28/2010)   Total Bili:  0.8 (08/19/2009)   Alk Phos:  50 (08/19/2009)   SGPT (ALT):  17 (08/19/2009)   SGOT (AST):  17 (08/19/2009)   Current Medications (verified): 1)  Aspirin 81 Mg Tbec (Aspirin) .... Take One Tablet By Mouth Daily 2)  Simvastatin 20 Mg Tabs (Simvastatin) .[name].. 1 By Mouth At Bedtime 3)  Triamcinolone Acetonide 0.5 % Crea (Triamcinolone Acetonide) .... Apply Bid To Affected Area 4)  B Complex  Tabs (B Complex Vitamins) .[name].. 1 Tab Once Daily 5)  Vitamin D3 1000 Unit Caps (Cholecalciferol) .... 2 Caps Once Daily 6)  Amlodipine Besylate 5 Mg Tabs (Amlodipine Besylate) .[name].. 1 By Mouth Once Daily 7)  Bystolic 10 Mg Tabs (Nebivolol Hcl) .[name].. 1 Once Daily 8)  Aricept 10 Mg Tabs (Donepezil Hcl) .[name].. 1 By Mouth Once Daily For Memory 9)  Lisinopril 5 Mg Tabs (  Lisinopril) .[name].. 1 By Mouth Qd 10)  Namenda 10 Mg Tabs (Memantine Hcl) .[name].. 1 By Mouth Bid 11)  Wellbutrin Sr 150 Mg Xr12h-Tab (Bupropion Hcl) .[name].. 1 By Mouth Q Am  Allergies (verified): 1)  Simvastatin (Simvastatin)  Past History:  Past Medical History: MYOCARDIAL INFARCTION, ANTERIOR WALL/ 11/2000 CORONARY ARTERY DISEASE HYPERTENSION (ICD-401.9) HYPERLIPIDEMIA (ICD-272.4) DEPRESSION HERPES ZOSTER (ICD-053.9) VERTIGO  DIVERTICULOSIS, COLON  OSTEOPOROSIS  Anxiety Depression Dementia  Review of Systems  The patient denies hoarseness, syncope, headaches, and hemoptysis.    Physical Exam  General:  alert, well-developed, well-nourished, and cooperative to examination.   dtr at side Eyes:  vision grossly intact; pupils equal, round and reactive to light.  conjunctiva and  lids normal.   wears glasses Ears:  normal pinnae bilaterally, without erythema, swelling, or tenderness to palpation. TMs clear, without effusion, or cerumen impaction. Hearing grossly normal bilaterally  Mouth:  teeth and gums in good repair; mucous membranes moist, without lesions or ulcers. oropharynx clear without exudate, no erythema.  Lungs:  normal respiratory effort, no intercostal retractions or use of accessory muscles; normal breath sounds bilaterally - no crackles and no wheezes.    Heart:  normal rate, regular rhythm, no murmur, and no rub. BLE without edema.    Impression & Recommendations:  Problem # 1:  ALLERGIC RHINITIS (ICD-477.9)  clear drainage and sinus pressure w/o fever or discolored dc - tx antihist and nasal steroid - call if symptoms unimproved, sooner if worse Her updated medication list for this problem includes:    Flonase 50 Mcg/act Susp (Fluticasone propionate) .[name]... 2 sprays each nostril once daily    Loratadine 10 Mg Tabs (Loratadine) .[name]... 1 by mouth once daily  Discussed use of allergy medications and environmental measures.   Orders: Prescription Created Electronically [phone])  Complete Medication List: 1)  Aspirin 81 Mg Tbec (Aspirin) .... Take one tablet by mouth daily 2)  Simvastatin 20 Mg Tabs (Simvastatin) .[name].. 1 by mouth at bedtime 3)  Triamcinolone Acetonide 0.5 % Crea (Triamcinolone acetonide) .... Apply bid to affected area 4)  B Complex Tabs (B complex vitamins) .[name].. 1 tab once daily 5)  Vitamin D3 1000 Unit Caps (Cholecalciferol) .... 2 caps once daily 6)  Amlodipine Besylate 5 Mg Tabs (Amlodipine besylate) .[name].. 1 by mouth once daily 7)  Bystolic 10 Mg Tabs (Nebivolol hcl) .[name].. 1 once daily 8)  Aricept 10 Mg Tabs (Donepezil hcl) .[name].. 1 by mouth once daily for memory 9)  Lisinopril 5 Mg Tabs (Lisinopril) .[name].. 1 by mouth qd 10)  Namenda 10 Mg Tabs (Memantine hcl) .[name].. 1 by mouth bid 11)  Wellbutrin Sr 150 Mg Xr12h-tab (Bupropion hcl) .[name]..  1 by mouth q am 12)  Flonase 50 Mcg/act Susp (Fluticasone propionate) .... 2 sprays each nostril once daily 13)  Loratadine 10 Mg Tabs (Loratadine) .[name].. 1 by mouth once daily  Patient Instructions: 1)  it was good to see you today. 2)  use medications as listed below for runny nose and sinus symptoms - your nose spray prescription has been electronically submitted to your pharmacy. Please take as directed. Contact our office if you believe you're having problems with the medication(s).  3)  if you develop worsening symptoms or fever, call us and we can reconsider antibiotics but it does not appear necessary to use any anitbiotic at this time  4)  Please schedule a follow-up appointment as needed and keep scheduled appt with dr. Posey Rea as planned. Prescriptions: FLONASE 50 MCG/ACT SUSP (FLUTICASONE  PROPIONATE) 2 sprays each nostril once daily  #1 x 1   Entered and Authorized by:   Newt Lukes MD   Signed by:   Newt Lukes MD on 02/16/2011   Method used:   Electronically to        [facility]  [phone]* (retail)       [address]       [place], [place]  [address]       Ph: [phone] or [phone]       Fax: [phone]   RxID:   [id number]    Orders Added: 1)  Est. Patient Level IV [36644] 2)  Prescription Created Electronically [phone] [phone]

## 2011-03-10 ENCOUNTER — Other Ambulatory Visit: Payer: Self-pay | Admitting: Internal Medicine

## 2011-03-19 ENCOUNTER — Ambulatory Visit: Payer: Self-pay | Admitting: Internal Medicine

## 2011-03-24 ENCOUNTER — Other Ambulatory Visit: Payer: Self-pay | Admitting: Internal Medicine

## 2011-03-24 DIAGNOSIS — Z1231 Encounter for screening mammogram for malignant neoplasm of breast: Secondary | ICD-10-CM

## 2011-04-14 NOTE — Assessment & Plan Note (Signed)
[facility]                            CARDIOLOGY OFFICE NOTE   [name], [name]                        MRN:          [id number]  DATE:05/23/2007                            DOB:          1926/12/25    PRIMARY CARE PHYSICIAN:  Dr. Posey Rea.   CARDIOLOGIST:  She had previously been followed by Dr. Ladona Ridgel.   HISTORY OF PRESENT ILLNESS:  [name] is a 77 year old female patient  with a history of anterior wall myocardial infarction in 2002 treated  with PTCA of the proximal LAD.  At that time she also had distal  plaquing of 20% in the left main, proximal AV circumflex with 70%  stenosis, RCA with sequential 70% narrowings in the mid section of the  vessel.  Her EF at that time was 30%.  Her EF has since recovered and  her Cardiolite study just 5 months after her infarct revealed an EF of  66%.  She had been essentially released by Dr. Ladona Ridgel a little over a  year ago.  He noted that the patient could follow up on a p.r.n. basis  and continue risk factor modification with her primary care physician.  She recently saw Dr. Posey Rea for an annual physical.  He set up for a  stress Myoview study.  The patient exercised for just 3 minutes and  achieved 4.6 METS.  She stopped secondary to fatigue.  Her nuclear  images revealed an EF of 75%, small anterior apical scar with mild  periinfarct ischemia.  She is referred for further evaluation.   The patient denies any recent history of chest pain or shortness of  breath.  She denies any syncope or near syncope.  Denies any orthopnea,  paroxysmal nocturnal dyspnea or pedal edema.  She walks about 5 days a  week without any symptoms whatsoever.   CURRENT MEDICATIONS:  1. Aspirin 325 mg daily.  2. Toprol-XL 50 mg daily.  3. Vitamin B.  4. Actonel.  5. Calcium.  6. Zocor 20 mg q.h.s.  7. Multivitamin.  8. Vitamin D.   ALLERGIES:  No known drug allergies.   SOCIAL HISTORY:  She denies any tobacco  abuse.   PHYSICAL EXAM:  She is a well-nourished, well-developed female in no  distress.  Blood pressure is 144/70, pulse 60, weight 125 pounds.  HEENT:  Normal.  NECK:  Without JVD.  CARDIAC EXAM:  Normal S1, S2.  Regular rate and rhythm without murmurs.  LUNGS:  Clear to auscultation bilaterally without wheeze, rhonchi or  rales.  ABDOMEN:  Soft, nontender with normoactive bowel sounds, no  organomegaly.  EXTREMITIES:  Without edema.  Calves soft, nontender.  Skin is warm and  dry.  NEUROLOGIC:  She is alert and oriented x3.  Cranial nerves II-XII  grossly intact.   IMPRESSION:  1. Coronary artery disease.      a.     Status post anterior wall myocardial infarction January,       2002, treated with percutaneous transluminal coronary angioplasty       to the left anterior descending.  b.     Residual coronary artery disease as noted above.      c.     Recent Myoview scan with small anterior apical scar with       mild periinfarct ischemia.  2. Preserved left ventricular function.  3. Treated dyslipidemia.  4. Treated hypertension.   PLAN:  The patient presents to the office today for followup on an  abnormal Cardiolite study.  As noted above, she is completely  asymptomatic.  When her recent nuclear study is compared to her  Cardiolite study of 2002, there has essentially been no change.  I  discussed this with Dr. Eden Emms.  Since she is asymptomatic, we recommend  no further workup.  Her blood pressure is a little bit elevated today  and she is somewhat concerned by this.  I have explained to her that her  prior blood pressure readings have been completely normal.  We will set  her up for routine blood pressure checks over the next several weeks.  If she remains elevated above 140/90 then she will require an adjustment  in her therapy.  She can either get this done with [place] or with Dr.  Posey Rea.  She can follow up with Dr. Eden Emms in 1 year's time.      Eileen Newcomer,  PA-C  Electronically Signed      [name]. Eden Emms, MD, [facility]  Electronically Signed   SW/MedQ  DD: 05/23/2007  DT: 05/24/2007  Job #: [id number]

## 2011-04-14 NOTE — Assessment & Plan Note (Signed)
[facility]                           PRIMARY CARE OFFICE NOTE   TAMANTHA, SALINE                        MRN:          [id number]  DATE:04/26/2007                            DOB:          June 11, 1927    The patient is a 77 year old female who presents for wellness  examination.   PAST MEDICAL HISTORY, FAMILY HISTORY AND SOCIAL HISTORY:  As per Apr 21, 2006 note.   CURRENT MEDICATIONS:  Reviewed.   ALLERGIES:  None.   REVIEW OF SYSTEMS:  Complains of left thumb pain at base.  Thinks she  might have some memory problems.  The rest of the 10-point Review of  Systems is negative.   PHYSICAL EXAMINATION:  VITAL SIGNS:  Blood pressure 126/89, pulse 64,  temp 97.3, weight 125 pounds.  GENERAL:  She looks well.  She is in no acute distress.  HEENT:  With moist mucosa.  NECK:  Supple.  No thyromegaly.  Bruit over the right carotid.  LUNGS:  Clear to auscultation, percussion.  No wheezing or rales.  HEART:  S1, S2, no murmur, no gallop.  ABDOMEN:  Soft, nontender, no organomegaly, no mass felt.  EXTREMITIES:  Lower extremities without edema.  NEUROLOGICAL:  She is alert, oriented, cooperative.  Denies being  depressed.  Memory exam grossly normal.  clock drawing is excellent.  SKIN:  Clear.   LABORATORY DATA:  EKG with normal sinus rhythm.   ASSESSMENT:  1. Normal wellness examination.  Age/health-related issues discussed.      Healthy lifestyle discussed.  Her EKG today looks good.  Obtain      chest x-ray next year.  Obtain vitamin D level.  Obtain lab work      appropriate for age and her medical condition.  Regular GYN care      with Dr. Nicholas Lose.  Mammogram yearly.  Colonoscopy is up-to-date.  2. Memory problems very mild.  She will let me know if there is a      concern.  3. Coronary artery disease.  Continue current therapy.  Obtain      Cardiolite stress test.  4. Dyslipidemia on therapy.  Monitor lipids.  5. Right carotid bruit.  Obtain  Carotid Doppler ultrasound.  Follow up      with me in six months.     Georgina Quint. Plotnikov, MD  Electronically Signed    AVP/MedQ  DD: 05/02/2007  DT: 05/02/2007  Job #: [id number]   cc:   Gretta Cool, M.D.

## 2011-04-14 NOTE — Assessment & Plan Note (Signed)
[facility]                            CARDIOLOGY OFFICE NOTE   MARALEE, HIGUCHI                        MRN:          [id number]  DATE:09/03/2008                            DOB:          Jul 14, 1927    Ms. Zuckerman returns today for followup.  When I saw her initially on June 07, 2008, she had an elevated blood pressure of 146/81.  She also is off  of her statin and her LDL increased to 125.   We reinitiated her statin.  Her followup lipid panel on July 09, 2008,  showed a total cholesterol of 133, triglycerides 88, HDL 49, and LDL 66.  Her comprehensive metabolic panel was normal on lisinopril 10 mg a day.  We had increased that from 5 mg to 10 mg a day.   PHYSICAL EXAMINATION:  VITAL SIGNS:  Her blood pressure today is 110/66,  her pulse is 70 and regular, and weight is 120.  RESPIRATORY:  Unchanged.  EXTREMITIES:  No edema.  Pulses are intact.   I have reviewed her lab work and her blood pressure findings and goals.  I have made no changes in her program.  We will plan on seeing her back  in a year.     Thomas C. Daleen Squibb, MD, [facility]  Electronically Signed    TCW/MedQ  DD: 09/03/2008  DT: 09/03/2008  Job #: [id number]

## 2011-04-14 NOTE — Assessment & Plan Note (Signed)
[name]                            CARDIOLOGY OFFICE NOTE   [name], [name]                        MRN:          [id number]  DATE:06/07/2008                            DOB:          [birth year]    SUMMARY:  [name] comes today to establish me as her cardiologist.  She has been a former patient of [name].  She has also  followed [name] for primary care.   Her problem list as well outlined by the [name], when he saw her  on May 23, 2007.  1. Briefly, she had a large anterior wall infarct in January 2002,      treated with PTCA of the LAD.  She had residual coronary disease.      A recent stress Myoview in 2008 showed EF of 75% with a small      anterior apical scar with mild peri-infarct ischemia.  She had been      treated medically.  She is having no angina or ischemic symptoms.      She seems to be quite active.  2. Hyperlipidemia.  She is now for what ever reason off of her      simvastatin.  She had excellent numbers on this medication, but      recent numbers by [name], showed total cholesterol of 200,      triglycerides of 138, HDL 47.3, and LDL 125.  Her CBC,      comprehensive metabolic panel, and fasting TSH were normal.  3. Hypertension.  Her blood pressures have been routinely in the 140s      here in the office per my perusal.   She denies orthopnea, PND, presyncope, syncope, or tachy palpitations.   Her current meds are aspirin 325 mg a day, calcium 1200 mg a day,  simvastatin 20 mg at bedtime, multivitamin daily, Toprol-XL 25 mg a day,  lisinopril 5 mg a day, and vitamin D.   Her blood pressure today is 146/81, her pulse is 68 and regular.  Her  EKG shows poor R-wave progression across the anterior precordium with  nonspecific ST-segment changes.  This is stable.   VITALS:  Her weight is 121.  HEENT:  Normocephalic, atraumatic.  PERRLA.  Extraocular movements  intact.  Sclerae are  clear.  Face symmetry is normal.  Carotid upstrokes  are equal bilaterally without bruits, no JVD.  Thyroid is not enlarged.  Trachea is midline.  LUNGS:  Clear.  HEART:  Regular rate and rhythm.  No gallop.  ABDOMEN:  Soft, good bowel sounds.  No midline bruit.  No hepatomegaly.  EXTREMITIES:  No cyanosis, clubbing, or edema.  Pulses are brisk.  NEURO:  Intact.  SKIN:  Shows few ecchymoses, otherwise, unremarkable.  MUSCULOSKELETAL:  Chronic arthritic changes.   ASSESSMENT AND PLAN:  [name] needs to be back on her simvastatin 20  mg p.o. at bedtime.  Goal LDL is less than 70.  She also needs to be  increased on lisinopril, which I have  done to 10 mg a day.  We will follow up with her with blood work in  about 6 weeks and then an office visit in about 6-8 weeks.     Eileen C. Daleen Squibb, MD, [name]  Electronically Signed    TCW/MedQ  DD: 06/07/2008  DT: 06/08/2008  Job #: [id number]   cc:   [name], M.D.

## 2011-04-17 ENCOUNTER — Encounter: Payer: Self-pay | Admitting: Internal Medicine

## 2011-04-17 NOTE — Cardiovascular Report (Signed)
[place]  Patient:    [name], [name]                        MRN: [id number] Proc. Date: 12/09/00 Adm. Date:  [id number] Attending:  Lewayne Bunting CC:         Doylene Canning. Ladona Ridgel, M.D. [facility]   Cardiac Catheterization  PROCEDURES PERFORMED: 1. Left heart catheterization. 2. Left ventriculogram. 3. Selective coronary angiography. 4. Percutaneous transluminal coronary angioplasty of the proximal    left anterior descending.  DIAGNOSES: 1. Three-vessel coronary artery disease. 2. Moderate left ventricular systolic dysfunction. 3. Acute anterior wall myocardial infarction. 4. Mitral regurgitation 1+.  INDICATIONS:  [name] is a 77 year old, white female, who presents with the acute onset of substernal chest discomfort approximately three hours prior to presentation.  The patient was found to have ST elevation in the anterior leads on presentation in the emergency room and is brought to the catheterization lab for emergent cardiac catheterization.  TECHNIQUE:  After informed consent was obtained, the patient was brought to the cardiac catheterization lab where both groins were sterilely prepped and draped.  A 7  French sheath placed using the modified Seldinger technique. The 6 [place] and JR4 catheters were then used to engage the left and right coronary arteries, and selective angiography performed using manual injections of contrast.  A 6 French pigtail catheter was then advanced into the left ventricle and the left ventriculogram performed using power injections of contrast.  FINDINGS:  Initial findings are as follows: 1. Left main trunk:  The left main trunk is a medium caliber vessel, with    a distal eccentric plaque of 20%. 2. Left anterior descending:  This is a large caliber vessel that provides two    major diagonal branches in the mid section.  The LAD has a long segment of    severe disease in the proximal segment encompassing the  first septal    perforator and extending up to the first diagonal branch with a focal    stenosis of 99%.  TIMI-1 flow is noted in the distal LAD. 3. Left circumflex artery:  The left circumflex artery is a large caliber    vessel consisting of the bifurcating distal marginal branch.  The proximal    AV circumflex has a tubular narrowing of 70%. 4. Right coronary artery:  The right coronary artery is dominant.  This is a    large caliber vessel that provides the posterior descending artery and    three posterior ventricular branches in its terminal segment.  There are    sequential narrowings of 70% in the mid section of the vessel.  LEFT VENTRICULOGRAM:  Mildly dilated end-systolic and end-diastolic dimensions.  Overall left ventricular function is moderately impaired. Ejection fraction approximately 30%.  There is akinesis of the distal anterior and apical walls.  Mitral regurgitation 1+ is noted. LV pressure is 113/20, aortic is 113/72, LVEDP equals 31.  DESCRIPTION OF PROCEDURE:  With these findings, we elected to proceed with percutaneous intervention of the LAD.  The patient had received aspirin and heparin in the emergency room and she was given Plavix 300 mg orally as well as ReoPro on a weight-adjusted basis.  ACT was greater than 250 seconds.  A 7 French 3.5 Voda left guide catheter was then used to engage the left coronary artery and a 0.014 inch Hi-Torque Floppy wire advanced in the distal LAD.  A 2.5 x 20  mm CrossSail balloon was then used to predilate the lesion at 8 atmospheres for 30 seconds.  Repeat angiography showed recanalization of the vessel and improvement of flow to TIMI grade 3.  There was still significant residual disease in the proximal LAD.  A 3.0 x 20 mm CrossSail balloon was introduced.  Two inflations performed at 8 atmospheres for 60 seconds within the proximal LAD.  Repeat angiography showed significant improvement in vessel lumen with only mild residual  narrowing of 30%.  However, continued angiography showed significant vessel recoil of 60-70% at the ostium of the LAD.  We then considered stenting of the vessel and a 3.0 x 15 mm CrossSail balloon introduced and used to further dilated the ostium of the LAD at 8 atmospheres for 180 seconds.  Repeat angiography showed improvement of vessel lumen with only mild residual narrowing of 30%.  Due to the location of the lesion at the ostium and concern about compromise of the left circumflex artery, we elected not to proceed with stent placement.  Angiography was then performed using intracoronary nitroglycerin showing no evidence of vessel damage and TIMI-3 flow throughout the left coronary system.  We allowed the artery to stabilize for several minutes and performed repeat angiography, which now showed no vessel recoil.  The guide catheter was then removed and the sheath secured into position.  The patient tolerated the procedure well with resolution of pain at the time of reperfusion.  She was transferred to CCU in stable condition.  FINAL RESULTS:  Successful percutaneous transluminal coronary angioplasty of the proximal left anterior descending with reduction of 99% narrowing to 30% with improvement of TIMI grade 1 to TIMI grade 3 flow.  ASSESSMENT AND PLAN:  [name] is a 77 year old white female, with advanced three-vessel coronary artery disease and left ventricular dysfunction.  The patient has undergone emergent percutaneous revascularization of the left anterior descending.  She will be stabilized medically and further assessment made for possible surgical revascularization. DD:  12/09/00 TD:  12/09/00 Job: [id number]

## 2011-04-17 NOTE — Discharge Summary (Signed)
[place]. [facility]  Patient:    MIRAY, MANCINO                        MRN: [id number] Adm. Date:  [id number] Disc. Date: 12/15/00 Attending:  Lewayne Bunting Dictator:   Chinita Pester, CRNP                           Discharge Summary  PRIMARY DIAGNOSIS:  Anterior wall myocardial infarction.  SECONDARY DIAGNOSIS:  Coronary artery disease.  HISTORY OF PRESENT ILLNESS:  This is a 77 year old female with no significant past medical history admitted with anterior wall MI.  She denies chest pain until December 08, 2000 at 2100 when she had sudden onset of substernal chest pain, nausea, vomiting, diaphoresis, and shortness of breath, 10/10, it was persistent for three hours before presenting for evaluation.  Emergency room EKGs revealed normal sinus rhythm with anterior ST elevation.  She was treated with aspirin, heparin, nitroglycerin, and beta blockers.  ALLERGIES:  States no known drug allergies.  PAST MEDICAL HISTORY:  None.  SOCIAL HISTORY:  She is married.  Denies tobacco or alcohol.  HOSPITAL COURSE:  Patient was admitted and placed on aspirin, heparin, beta blockers, and nitroglycerin drip.  She underwent a cardiac catheterization which showed left main distal 20%, LAD 99% proximal with a TIMI-I flow to diagonal branches, left circumflex proximal 70%, large distal marginal, RCA-dominant PDA, and three PV branches sequential 70% mid, ejection fraction was noted to be 30%.  Patient underwent a PTCA of her left anterior descending with a reduction 99% narrowing to 30% with a TIMI grade I to a TIMI grade III flow.  She was to be treated with medical therapy and further assessment for surgical revascularization if intolerant to medical therapy.  A 2-D echo showed overall left ventricular systolic function moderately decreased with an estimated ejection fraction of 35%.  There was akinesis at apical-periapical wall with akinesis at the mid distal anteroseptal  wall, mild aortic valvular regurgitation, mild mitral valvular regurgitation, estimated peak pulmonary artery systolic pressures were moderately increased and trivial pericardial effusion posterior to heart.  Patient was started on ACE inhibitors as well as beta blockers which she was intolerant to at times because of her low blood pressure.  Patient was eventually discharged to home on the following medications.  DISCHARGE MEDICATIONS: 1. Altace 2.5 q.12. 2. Enteric-coated aspirin 325 daily. 3. Toprol XL 25 daily. 4. Plavix 75 daily for four weeks.  FOLLOWUP:  She is to follow up with Dr. Sharrell Ku on December 29, 2000 at 10:45 _p.m.  ACTIVITY:  As tolerated with no strenuous activity.  DIET:  Low-fat, low-cholesterol, no-added-salt diet.  DISCHARGE INSTRUCTIONS:  She was to call the office if she noticed any drainage or if she developed a lump at her right groin at the cath site.  A lipid profile was also to be performed within six weeks. DD:  12/15/00 TD:  12/15/00 Job: [id number]

## 2011-04-21 ENCOUNTER — Other Ambulatory Visit (INDEPENDENT_AMBULATORY_CARE_PROVIDER_SITE_OTHER): Payer: Medicare Other

## 2011-04-21 ENCOUNTER — Ambulatory Visit (INDEPENDENT_AMBULATORY_CARE_PROVIDER_SITE_OTHER): Payer: Medicare Other | Admitting: Internal Medicine

## 2011-04-21 ENCOUNTER — Other Ambulatory Visit (INDEPENDENT_AMBULATORY_CARE_PROVIDER_SITE_OTHER): Payer: Medicare Other | Admitting: Internal Medicine

## 2011-04-21 ENCOUNTER — Encounter: Payer: Self-pay | Admitting: Internal Medicine

## 2011-04-21 DIAGNOSIS — R202 Paresthesia of skin: Secondary | ICD-10-CM

## 2011-04-21 DIAGNOSIS — R42 Dizziness and giddiness: Secondary | ICD-10-CM

## 2011-04-21 DIAGNOSIS — J329 Chronic sinusitis, unspecified: Secondary | ICD-10-CM

## 2011-04-21 DIAGNOSIS — R209 Unspecified disturbances of skin sensation: Secondary | ICD-10-CM

## 2011-04-21 DIAGNOSIS — R5383 Other fatigue: Secondary | ICD-10-CM

## 2011-04-21 DIAGNOSIS — R5381 Other malaise: Secondary | ICD-10-CM

## 2011-04-21 DIAGNOSIS — Z0000 Encounter for general adult medical examination without abnormal findings: Secondary | ICD-10-CM

## 2011-04-21 LAB — CBC WITH DIFFERENTIAL/PLATELET
Basophils Absolute: 0 10*3/uL (ref 0.0–0.1)
Eosinophils Absolute: 0.1 10*3/uL (ref 0.0–0.7)
Lymphocytes Relative: 18.2 % (ref 12.0–46.0)
MCHC: 33.7 g/dL (ref 30.0–36.0)
Monocytes Relative: 11.8 % (ref 3.0–12.0)
Neutrophils Relative %: 67.6 % (ref 43.0–77.0)
Platelets: 198 10*3/uL (ref 150.0–400.0)
RDW: 14.9 % — ABNORMAL HIGH (ref 11.5–14.6)

## 2011-04-21 LAB — COMPREHENSIVE METABOLIC PANEL
ALT: 16 U/L (ref 0–35)
AST: 19 U/L (ref 0–37)
CO2: 29 mEq/L (ref 19–32)
Calcium: 9.2 mg/dL (ref 8.4–10.5)
Chloride: 106 mEq/L (ref 96–112)
GFR: 51.66 mL/min — ABNORMAL LOW (ref >60.00)
Sodium: 141 mEq/L (ref 135–145)
Total Protein: 7 g/dL (ref 6.0–8.3)

## 2011-04-21 LAB — URINALYSIS, ROUTINE W REFLEX MICROSCOPIC
Specific Gravity, Urine: >=1.03 (ref 1.000–1.030)
Total Protein, Urine: NEGATIVE
Urine Glucose: NEGATIVE
Urobilinogen, UA: 1 (ref 0.0–1.0)

## 2011-04-21 LAB — SEDIMENTATION RATE: Sed Rate: 13 mm/hr (ref 0–22)

## 2011-04-21 LAB — VITAMIN B12: Vitamin B-12: 905 pg/mL (ref 211–911)

## 2011-04-21 LAB — MAGNESIUM: Magnesium: 2.4 mg/dL (ref 1.5–2.5)

## 2011-04-21 LAB — CK: Total CK: 57 U/L (ref 7–177)

## 2011-04-21 MED ORDER — AMOXICILLIN 500 MG PO CAPS: 1000.0000 mg | ORAL_CAPSULE | Freq: Two times a day (BID) | ORAL | Status: AC

## 2011-04-21 NOTE — Assessment & Plan Note (Signed)
H-P is pos on L Ortho BP drop

## 2011-04-21 NOTE — Progress Notes (Signed)
  Subjective:    Patient ID: [name], female    DOB: 05-Sep-1927, 77 y.o.   MRN: [id number]  HPI  C/o fatigue and dizzy spells - swimmy-headed C/o nose burning  Review of Systems  Constitutional: Positive for fatigue. Negative for chills, appetite change and unexpected weight change.  HENT: Negative for congestion.   Respiratory: Negative for choking and shortness of breath.   Cardiovascular: Negative for leg swelling.  Gastrointestinal: Negative for blood in stool.  Genitourinary: Negative for dysuria.  Musculoskeletal: Negative for back pain, arthralgias and gait problem.  Skin: Negative for pallor and rash.  Neurological: Positive for weakness. Negative for syncope.  Psychiatric/Behavioral: Positive for confusion and decreased concentration. Negative for suicidal ideas, behavioral problems, sleep disturbance and agitation. The patient is nervous/anxious.        Objective:   Physical Exam  Constitutional: She appears well-developed and well-nourished. No distress.  HENT:  Head: Normocephalic.  Right Ear: External ear normal.  Left Ear: External ear normal.  Nose: Nose normal.  Mouth/Throat: Oropharynx is clear and moist.  Eyes: Conjunctivae are normal. Pupils are equal, round, and reactive to light. Right eye exhibits no discharge. Left eye exhibits no discharge.  Neck: Normal range of motion. Neck supple. No JVD present. No tracheal deviation present. No thyromegaly present.  Cardiovascular: Normal rate, regular rhythm and normal heart sounds.   Pulmonary/Chest: No stridor. No respiratory distress. She has no wheezes.  Abdominal: Soft. Bowel sounds are normal. She exhibits no distension and no mass. There is no tenderness. There is no rebound and no guarding.  Musculoskeletal: She exhibits no edema and no tenderness.  Lymphadenopathy:    She has no cervical adenopathy.  Neurological: She displays normal reflexes. No cranial nerve deficit. She exhibits normal muscle tone.  Coordination normal.  Skin: No rash noted. No erythema.  Psychiatric: She has a normal mood and affect. Her behavior is normal. Judgment normal.        Lab Results  Component Value Date   WBC 6.6 04/21/2011   HGB 13.4 04/21/2011   HCT 39.9 04/21/2011   PLT 198.0 04/21/2011   CHOL 129 08/19/2009   TRIG 96.0 08/19/2009   HDL 52.40 08/19/2009   ALT 16 04/21/2011   AST 19 04/21/2011   NA 141 04/21/2011   K 4.0 04/21/2011   CL 106 04/21/2011   CREATININE 1.1 04/21/2011   BUN 22 04/21/2011   CO2 29 04/21/2011   TSH 2.28 04/21/2011     Assessment & Plan:  Sinusitis, chronic Empiric po abx  Dizzy spells H-P is pos on L Ortho BP drop    Confusion - chronic  Dementia  - on Rx  Fatigue - unclear etiology

## 2011-04-21 NOTE — Patient Instructions (Signed)
Take Lisinopril 1/2 tab and Amlodipine 1/2 tab Take Amoxicillin

## 2011-04-21 NOTE — Assessment & Plan Note (Signed)
Empiric po abx

## 2011-04-22 ENCOUNTER — Telehealth: Payer: Self-pay | Admitting: Internal Medicine

## 2011-04-22 NOTE — Telephone Encounter (Signed)
Stacey, please, inform patient that all labs are ok Thx 

## 2011-04-23 NOTE — Telephone Encounter (Signed)
Pt informed

## 2011-04-24 ENCOUNTER — Telehealth: Payer: Self-pay

## 2011-04-24 NOTE — Telephone Encounter (Signed)
I doubt any relation, however it is ok to d/c Thx

## 2011-04-24 NOTE — Telephone Encounter (Signed)
Patient daughter lmovm stating that she was seen on 04/21/11 and given amoxicillin. She has taken 10 pills and c/o not being able to sleep at night. Per pt she did not have this problem prior to starting medication and declines to take anymore. She is requesting advisment from MD

## 2011-04-24 NOTE — Telephone Encounter (Signed)
Patient informed. 

## 2011-04-27 ENCOUNTER — Encounter: Payer: Self-pay | Admitting: Internal Medicine

## 2011-04-28 ENCOUNTER — Ambulatory Visit
Admission: RE | Admit: 2011-04-28 | Discharge: 2011-04-28 | Disposition: A | Discharge status: Home / Self Care | Payer: Medicare Other | Source: Ambulatory Visit | Attending: Internal Medicine | Admitting: Internal Medicine

## 2011-04-28 DIAGNOSIS — Z1231 Encounter for screening mammogram for malignant neoplasm of breast: Secondary | ICD-10-CM

## 2011-04-29 ENCOUNTER — Other Ambulatory Visit: Payer: Self-pay | Admitting: Cardiology

## 2011-04-30 ENCOUNTER — Other Ambulatory Visit: Payer: Self-pay | Admitting: *Deleted

## 2011-04-30 MED ORDER — SIMVASTATIN 20 MG PO TABS: 20.0000 mg | ORAL_TABLET | Freq: Every day | ORAL | Status: DC

## 2011-05-04 ENCOUNTER — Encounter: Payer: Self-pay | Admitting: Internal Medicine

## 2011-05-05 ENCOUNTER — Ambulatory Visit (INDEPENDENT_AMBULATORY_CARE_PROVIDER_SITE_OTHER): Payer: Medicare Other | Admitting: Internal Medicine

## 2011-05-05 ENCOUNTER — Encounter: Payer: Self-pay | Admitting: Internal Medicine

## 2011-05-05 VITALS — BP 122/70 | HR 76 | Temp 98.4°F | Resp 16 | Wt 121.0 lb

## 2011-05-05 DIAGNOSIS — F068 Other specified mental disorders due to known physiological condition: Secondary | ICD-10-CM

## 2011-05-05 DIAGNOSIS — R5383 Other fatigue: Secondary | ICD-10-CM

## 2011-05-05 DIAGNOSIS — L570 Actinic keratosis: Secondary | ICD-10-CM

## 2011-05-05 DIAGNOSIS — R42 Dizziness and giddiness: Secondary | ICD-10-CM

## 2011-05-05 DIAGNOSIS — R5381 Other malaise: Secondary | ICD-10-CM

## 2011-05-05 DIAGNOSIS — I10 Essential (primary) hypertension: Secondary | ICD-10-CM

## 2011-05-05 MED ORDER — IPRATROPIUM BROMIDE 0.06 % NA SOLN: 2.0000 | Freq: Four times a day (QID) | NASAL | Status: DC

## 2011-05-05 NOTE — Progress Notes (Signed)
Subjective:    Patient ID: [name], female    DOB: 06-08-1927, 77 y.o.   MRN: [id number]  HPI    Review of Systems     Objective:   Physical Exam       AK - R cheek Assessment & Plan:   Procedure Note :     Procedure : Cryosurgery  Subjective:    Patient ID: [name], female    DOB: 1927-03-24, 77 y.o.   MRN: [id number]  HPI F/u on dementia C/o fatigue and dizzy spells - swimmy-headed C/o nose burning - better  Review of Systems  Constitutional: Positive for fatigue. Negative for chills, appetite change and unexpected weight change.  HENT: Negative for congestion.   Respiratory: Negative for choking and shortness of breath.   Cardiovascular: Negative for leg swelling.  Gastrointestinal: Negative for blood in stool.  Genitourinary: Negative for dysuria.  Musculoskeletal: Negative for back pain, arthralgias and gait problem.  Skin: Negative for pallor and rash.  Neurological: Positive for weakness. Negative for syncope.  Psychiatric/Behavioral: Positive for confusion and decreased concentration. Negative for suicidal ideas, behavioral problems, sleep disturbance and agitation. The patient is nervous/anxious.        Objective:   Physical Exam  Constitutional: She appears well-developed and well-nourished. No distress.  HENT:  Head: Normocephalic.  Right Ear: External ear normal.  Left Ear: External ear normal.  Nose: Nose normal.  Mouth/Throat: Oropharynx is clear and moist.  Eyes: Conjunctivae are normal. Pupils are equal, round, and reactive to light. Right eye exhibits no discharge. Left eye exhibits no discharge.  Neck: Normal range of motion. Neck supple. No JVD present. No tracheal deviation present. No thyromegaly present.  Cardiovascular: Normal rate, regular rhythm and normal heart sounds.   Pulmonary/Chest: No stridor. No respiratory distress. She has no wheezes.  Abdominal: Soft. Bowel sounds are normal. She exhibits no distension and no  mass. There is no tenderness. There is no rebound and no guarding.  Musculoskeletal: She exhibits no edema and no tenderness.  Lymphadenopathy:    She has no cervical adenopathy.  Neurological: She displays normal reflexes. No cranial nerve deficit. She exhibits normal muscle tone. Coordination normal.  Skin: No rash noted. No erythema.  Psychiatric: She has a normal mood and affect. Her behavior is normal. Judgment normal.   AK on R cheek     Lab Results  Component Value Date   WBC 6.6 04/21/2011   HGB 13.4 04/21/2011   HCT 39.9 04/21/2011   PLT 198.0 04/21/2011   CHOL 129 08/19/2009   TRIG 96.0 08/19/2009   HDL 52.40 08/19/2009   ALT 16 04/21/2011   AST 19 04/21/2011   NA 141 04/21/2011   K 4.0 04/21/2011   CL 106 04/21/2011   CREATININE 1.1 04/21/2011   BUN 22 04/21/2011   CO2 29 04/21/2011   TSH 2.28 04/21/2011     Assessment & Plan:  Sinusitis, chronic Empiric po abx  Dizzy spells  Some better - multifactorial. Cut back on BP meds if needed H-P is pos on L Ortho BP drop    Confusion - chronic  Dementia  - on Rx  Fatigue - unclear etiology. As above - see dizziness  AK on R cheek  Procedure note:   Indication:  Actinic keratosis(es)   Risks including unsuccessful procedure , bleeding, infection, bruising, scar, a need for a repeat  procedure and others were explained to the patient in detail as well as the benefits. Informed consent was  obtained verbally.   1  lesion(s)  on R cheek   was/were treated with liquid nitrogen on a Q-tip in a usual fasion . Band-Aid was applied and antibiotic ointment was given for a later use.   Tolerated well. Complications none.   Postprocedure instructions :     Keep the wounds clean. You can wash them with liquid soap and water. Pat dry with gauze or a Kleenex tissue  Before applying antibiotic ointment and a Band-Aid.   You need to report immediately  if  any signs of infection develop.

## 2011-05-05 NOTE — Patient Instructions (Signed)
Use Ipratropium nasal spray for runny nose Stop Fluticasone nasal spray Use saline spray as needed

## 2011-05-10 ENCOUNTER — Encounter: Payer: Self-pay | Admitting: Internal Medicine

## 2011-07-11 ENCOUNTER — Emergency Department (HOSPITAL_COMMUNITY)
Admission: EM | Admit: 2011-07-11 | Discharge: 2011-07-11 | Disposition: A | Discharge status: Home / Self Care | Payer: Medicare Other | Attending: Emergency Medicine | Admitting: Emergency Medicine

## 2011-07-11 DIAGNOSIS — I252 Old myocardial infarction: Secondary | ICD-10-CM | POA: Insufficient documentation

## 2011-07-11 DIAGNOSIS — Z79899 Other long term (current) drug therapy: Secondary | ICD-10-CM | POA: Insufficient documentation

## 2011-07-11 DIAGNOSIS — R110 Nausea: Secondary | ICD-10-CM | POA: Insufficient documentation

## 2011-07-11 DIAGNOSIS — I10 Essential (primary) hypertension: Secondary | ICD-10-CM | POA: Insufficient documentation

## 2011-07-11 DIAGNOSIS — R5383 Other fatigue: Secondary | ICD-10-CM | POA: Insufficient documentation

## 2011-07-11 DIAGNOSIS — R42 Dizziness and giddiness: Secondary | ICD-10-CM | POA: Insufficient documentation

## 2011-07-11 DIAGNOSIS — E860 Dehydration: Secondary | ICD-10-CM | POA: Insufficient documentation

## 2011-07-11 DIAGNOSIS — R5381 Other malaise: Secondary | ICD-10-CM | POA: Insufficient documentation

## 2011-07-11 DIAGNOSIS — R112 Nausea with vomiting, unspecified: Secondary | ICD-10-CM | POA: Insufficient documentation

## 2011-07-11 LAB — URINALYSIS, ROUTINE W REFLEX MICROSCOPIC
Glucose, UA: NEGATIVE mg/dL
Ketones, ur: NEGATIVE mg/dL
Leukocytes, UA: NEGATIVE
Nitrite: NEGATIVE
Specific Gravity, Urine: 1.014 (ref 1.005–1.030)
pH: 8 (ref 5.0–8.0)

## 2011-07-11 LAB — DIFFERENTIAL
Basophils Absolute: 0 10*3/uL (ref 0.0–0.1)
Basophils Relative: 0 % (ref 0–1)
Eosinophils Absolute: 0 10*3/uL (ref 0.0–0.7)
Eosinophils Relative: 0 % (ref 0–5)
Lymphs Abs: 0.9 10*3/uL (ref 0.7–4.0)
Neutrophils Relative %: 75 % (ref 43–77)

## 2011-07-11 LAB — CBC
MCV: 87.7 fL (ref 78.0–100.0)
Platelets: 208 10*3/uL (ref 150–400)
RBC: 4.65 MIL/uL (ref 3.87–5.11)
RDW: 13.2 % (ref 11.5–15.5)
WBC: 6.4 10*3/uL (ref 4.0–10.5)

## 2011-07-11 LAB — BASIC METABOLIC PANEL
CO2: 24 mEq/L (ref 19–32)
Chloride: 103 mEq/L (ref 96–112)
Creatinine, Ser: 0.67 mg/dL (ref 0.50–1.10)
GFR calc Af Amer: >60 mL/min (ref >60)
Potassium: 3.4 mEq/L — ABNORMAL LOW (ref 3.5–5.1)
Sodium: 138 mEq/L (ref 135–145)

## 2011-07-13 ENCOUNTER — Telehealth: Payer: Self-pay | Admitting: *Deleted

## 2011-07-13 NOTE — Telephone Encounter (Signed)
Noted. OV w/any MD sooner if needed Thx

## 2011-07-13 NOTE — Telephone Encounter (Signed)
Pt went to the ER this sat am for "shaking all over", nausea & vomiting. She was dx fatigue but does not feel better - c/o "swimmy head" and "does not feel any better". I scheduled pt for 30 min OV on Wednesday for ER f/u. She will call back with any change in symptoms.

## 2011-07-15 ENCOUNTER — Ambulatory Visit (INDEPENDENT_AMBULATORY_CARE_PROVIDER_SITE_OTHER): Payer: Medicare Other | Admitting: Internal Medicine

## 2011-07-15 ENCOUNTER — Encounter: Payer: Self-pay | Admitting: Internal Medicine

## 2011-07-15 DIAGNOSIS — R5383 Other fatigue: Secondary | ICD-10-CM

## 2011-07-15 DIAGNOSIS — R42 Dizziness and giddiness: Secondary | ICD-10-CM

## 2011-07-15 DIAGNOSIS — F4321 Adjustment disorder with depressed mood: Secondary | ICD-10-CM | POA: Insufficient documentation

## 2011-07-15 DIAGNOSIS — R5381 Other malaise: Secondary | ICD-10-CM

## 2011-07-15 DIAGNOSIS — F4320 Adjustment disorder, unspecified: Secondary | ICD-10-CM | POA: Insufficient documentation

## 2011-07-15 DIAGNOSIS — R413 Other amnesia: Secondary | ICD-10-CM

## 2011-07-15 DIAGNOSIS — I10 Essential (primary) hypertension: Secondary | ICD-10-CM

## 2011-07-15 NOTE — Assessment & Plan Note (Signed)
Discussed.

## 2011-07-15 NOTE — Assessment & Plan Note (Signed)
1/2 dose each of her BP meds

## 2011-07-15 NOTE — Assessment & Plan Note (Signed)
Reduce BP Rx dose

## 2011-07-15 NOTE — Progress Notes (Signed)
  Subjective:    Patient ID: [name], female    DOB: 1927-07-14, 77 y.o.   MRN: [id number]  HPI  The patient presents for a follow-up of  chronic hypertension, CAD, dementia, chronic dyslipidemia. She stopped all her meds x 4-6 wks while her husband was sick and dying with ALL as I understand. C/o weakness and dizziness, no appetite, nauseated.  Wt Readings from Last 3 Encounters:  07/15/11 120 lb (54.432 kg)  05/05/11 121 lb (54.885 kg)  04/21/11 124 lb (56.246 kg)       Review of Systems  Constitutional: Positive for fatigue. Negative for chills, activity change, appetite change and unexpected weight change.  HENT: Negative for congestion, mouth sores and sinus pressure.   Eyes: Negative for visual disturbance.  Respiratory: Negative for cough and chest tightness.   Gastrointestinal: Negative for nausea and abdominal pain.  Genitourinary: Negative for frequency, difficulty urinating and vaginal pain.  Musculoskeletal: Negative for back pain and gait problem.  Skin: Negative for pallor and rash.  Neurological: Negative for dizziness, tremors, weakness, numbness and headaches.  Psychiatric/Behavioral: Positive for confusion, sleep disturbance and dysphoric mood. The patient is nervous/anxious.        Objective:   Physical Exam  Constitutional: She appears well-developed. No distress.  HENT:  Head: Normocephalic.  Right Ear: External ear normal.  Left Ear: External ear normal.  Nose: Nose normal.  Mouth/Throat: Oropharynx is clear and moist.  Eyes: Conjunctivae are normal. Pupils are equal, round, and reactive to light. Right eye exhibits no discharge. Left eye exhibits no discharge.  Neck: Normal range of motion. Neck supple. No JVD present. No tracheal deviation present. No thyromegaly present.  Cardiovascular: Normal rate, regular rhythm and normal heart sounds.   Pulmonary/Chest: No stridor. No respiratory distress. She has no wheezes.  Abdominal: Soft. Bowel sounds  are normal. She exhibits no distension and no mass. There is no tenderness. There is no rebound and no guarding.  Musculoskeletal: She exhibits no edema and no tenderness.  Lymphadenopathy:    She has no cervical adenopathy.  Neurological: She displays normal reflexes. No cranial nerve deficit. She exhibits normal muscle tone. Coordination normal.  Skin: No rash noted. No erythema.  Psychiatric: She has a normal mood and affect. Her behavior is normal. Judgment and thought content normal.       sad     Chart, labs, tests were reviewed     Assessment & Plan:    A complex case. We had a long discussion w/pt and her dtr re: prognosis and disposition options. Grieving stages were discussed. We may need to ref to Home Care agency of their choice.

## 2011-07-15 NOTE — Patient Instructions (Signed)
Take Lisinopril at hs Take 1/2 tab of each of your blood pressed med if you can Hold Simvastatin

## 2011-07-15 NOTE — Assessment & Plan Note (Signed)
Restarted her Rx.

## 2011-08-11 ENCOUNTER — Ambulatory Visit (INDEPENDENT_AMBULATORY_CARE_PROVIDER_SITE_OTHER): Payer: Medicare Other | Admitting: Internal Medicine

## 2011-08-11 ENCOUNTER — Encounter: Payer: Self-pay | Admitting: Internal Medicine

## 2011-08-11 DIAGNOSIS — R42 Dizziness and giddiness: Secondary | ICD-10-CM

## 2011-08-11 DIAGNOSIS — R5383 Other fatigue: Secondary | ICD-10-CM

## 2011-08-11 DIAGNOSIS — R413 Other amnesia: Secondary | ICD-10-CM

## 2011-08-11 DIAGNOSIS — F4321 Adjustment disorder with depressed mood: Secondary | ICD-10-CM

## 2011-08-11 NOTE — Assessment & Plan Note (Signed)
Better 9/12

## 2011-08-11 NOTE — Assessment & Plan Note (Signed)
Grieving less

## 2011-08-11 NOTE — Assessment & Plan Note (Signed)
Continue with current prescription therapy as reflected on the Med list. Back on meds short of Simvastatin

## 2011-08-11 NOTE — Assessment & Plan Note (Signed)
Continue with current prescription therapy as reflected on the Med list.  

## 2011-08-11 NOTE — Patient Instructions (Signed)
OK to restart Simvastatin if tolerated

## 2011-08-11 NOTE — Progress Notes (Signed)
  Subjective:    Patient ID: [name], female    DOB: March 11, 1927, 77 y.o.   MRN: [id number]  HPI    The patient is here to follow up on chronic dementia, grief, depression, anxiety, headaches and chronic moderate fibromyalgia symptoms controlled with medicines  Review of Systems  Constitutional: Positive for fatigue. Negative for chills, activity change, appetite change and unexpected weight change.  HENT: Negative for congestion, mouth sores and sinus pressure.   Eyes: Negative for visual disturbance.  Respiratory: Negative for cough and chest tightness.   Gastrointestinal: Negative for nausea and abdominal pain.  Genitourinary: Negative for frequency, difficulty urinating and vaginal pain.  Musculoskeletal: Positive for back pain and gait problem.  Skin: Negative for pallor and rash.  Neurological: Negative for dizziness, tremors, weakness, numbness and headaches.  Psychiatric/Behavioral: Negative for confusion and sleep disturbance.   Wt Readings from Last 3 Encounters:  08/11/11 118 lb (53.524 kg)  07/15/11 120 lb (54.432 kg)  05/05/11 121 lb (54.885 kg)       Objective:   Physical Exam  Constitutional: She appears well-developed and well-nourished. No distress.  HENT:  Head: Normocephalic.  Right Ear: External ear normal.  Left Ear: External ear normal.  Nose: Nose normal.  Mouth/Throat: Oropharynx is clear and moist.  Eyes: Conjunctivae are normal. Pupils are equal, round, and reactive to light. Right eye exhibits no discharge. Left eye exhibits no discharge.  Neck: Normal range of motion. Neck supple. No JVD present. No tracheal deviation present. No thyromegaly present.  Cardiovascular: Normal rate, regular rhythm and normal heart sounds.   Pulmonary/Chest: No stridor. No respiratory distress. She has no wheezes.  Abdominal: Soft. Bowel sounds are normal. She exhibits no distension and no mass. There is no tenderness. There is no rebound and no guarding.    Musculoskeletal: She exhibits no edema and no tenderness.  Lymphadenopathy:    She has no cervical adenopathy.  Neurological: She displays normal reflexes. No cranial nerve deficit. She exhibits normal muscle tone. Coordination normal.  Skin: No rash noted. No erythema.  Psychiatric: She has a normal mood and affect. Her behavior is normal. Thought content normal.          Assessment & Plan:

## 2011-08-28 ENCOUNTER — Ambulatory Visit (INDEPENDENT_AMBULATORY_CARE_PROVIDER_SITE_OTHER): Payer: Medicare Other | Admitting: Internal Medicine

## 2011-08-28 ENCOUNTER — Encounter: Payer: Self-pay | Admitting: Internal Medicine

## 2011-08-28 VITALS — BP 120/52 | HR 69 | Temp 98.1°F | Ht 62.0 in | Wt 118.8 lb

## 2011-08-28 DIAGNOSIS — M5416 Radiculopathy, lumbar region: Secondary | ICD-10-CM

## 2011-08-28 DIAGNOSIS — IMO0002 Reserved for concepts with insufficient information to code with codable children: Secondary | ICD-10-CM

## 2011-08-28 DIAGNOSIS — M79669 Pain in unspecified lower leg: Secondary | ICD-10-CM

## 2011-08-28 DIAGNOSIS — M79609 Pain in unspecified limb: Secondary | ICD-10-CM

## 2011-08-28 MED ORDER — PREDNISONE (PAK) 10 MG PO TABS: 10.0000 mg | ORAL_TABLET | ORAL | Status: DC

## 2011-08-28 MED ORDER — METHOCARBAMOL 500 MG PO TABS: 500.0000 mg | ORAL_TABLET | Freq: Every evening | ORAL | Status: AC | PRN

## 2011-08-28 NOTE — Patient Instructions (Signed)
It was good to see you today. Your leg pain is coming from arthritis flare in your back - will treat with prednisone taper and muscle relaxers as dsicussed Your prescription(s) have been submitted to your pharmacy. Please take as directed and contact our office if you believe you are having problem(s) with the medication(s). Heating pad ok -  Please call if symptoms worse or unimproved as discussed

## 2011-08-28 NOTE — Progress Notes (Signed)
  Subjective:    Patient ID: [name], female    DOB: 1927-11-02, 77 y.o.   MRN: [id number]  HPI  complains of LLE pain Onset >1 week ago Located over anterior, distal shin Not radiation into foot or up thigh, buttock Precipitated by nothing: denies overuse, trauma, fall Not associated with swelling; no brusing, rash or skin changes over affected skin Mild chronic low back pain - ?slightly worse than usual - uses heating pad prn for same  Past Medical History  Diagnosis Date  . CORONARY ARTERY DISEASE   . HYPERTENSION   . HYPERLIPIDEMIA   . DEPRESSION   . HERPES ZOSTER   . VERTIGO   . DIVERTICULOSIS, COLON   . OSTEOPOROSIS   . ANXIETY   . DEMENTIA   . Myocardial infarction, anterior wall     Review of Systems  Constitutional: Negative for fever and fatigue.  Respiratory: Negative for cough and shortness of breath.   Musculoskeletal: Negative for joint swelling and gait problem.  Neurological: Negative for light-headedness and headaches.  Hematological: Does not bruise/bleed easily.       Objective:   Physical Exam  BP 120/52  Pulse 69  Temp(Src) 98.1 F (36.7 C) (Oral)  Ht 5\' 2"  (1.575 m)  Wt 118 lb 12.8 oz (53.887 kg)  BMI 21.73 kg/m2  SpO2 97% Constitutional: She is well-developed and well-nourished. No distress. dtr at side -  Neck: Normal range of motion. Neck supple. No JVD present. No thyromegaly present.  Cardiovascular: Normal rate, regular rhythm and normal heart sounds.  No murmur heard. No BLE edema. Pulmonary/Chest: Effort normal and breath sounds normal. No respiratory distress. She has no wheezes.  Musculoskeletal: Back: full range of motion of thoracic and lumbar spine. Non tender to palpation over low back or L anterior shin. Negative straight leg raise. DTR's are symmetrically intact. Sensation intact in all dermatomes of the lower extremities. Full strength to manual muscle testing; patient is able to heel toe walk without difficulty and  ambulates with antalgic gait. Skin: Skin is warm and dry. No rash or bruising noted over area of pain on left shin. No erythema.  Psychiatric: She has a normal mood and affect. Her behavior is normal. Judgment and thought content normal.       Assessment & Plan:  L shin pain, intermittent - suspect radiculopathy given nature of symptoms and lack of other findings on leg/skin - pred pak and robaxin for muscle relaxer - to follow up if unimproved

## 2011-09-02 ENCOUNTER — Telehealth: Payer: Self-pay | Admitting: *Deleted

## 2011-09-02 DIAGNOSIS — M79605 Pain in left leg: Secondary | ICD-10-CM

## 2011-09-02 NOTE — Telephone Encounter (Signed)
Daughter called - Prednisone pack did not help pt's pain. She is req to know what the next step is?

## 2011-09-02 NOTE — Telephone Encounter (Signed)
LS spine Xray ROV w/Dr Felicity Coyer or me Thx

## 2011-09-03 ENCOUNTER — Ambulatory Visit (INDEPENDENT_AMBULATORY_CARE_PROVIDER_SITE_OTHER)
Admission: RE | Admit: 2011-09-03 | Discharge: 2011-09-03 | Disposition: A | Discharge status: Home / Self Care | Payer: Medicare Other | Source: Ambulatory Visit | Attending: Internal Medicine | Admitting: Internal Medicine

## 2011-09-03 DIAGNOSIS — M79609 Pain in unspecified limb: Secondary | ICD-10-CM

## 2011-09-03 NOTE — Telephone Encounter (Signed)
Ov scheduled for tomorrow. Daughter informed. Xray order entered.

## 2011-09-04 ENCOUNTER — Ambulatory Visit (INDEPENDENT_AMBULATORY_CARE_PROVIDER_SITE_OTHER): Payer: Medicare Other | Admitting: Internal Medicine

## 2011-09-04 ENCOUNTER — Ambulatory Visit (INDEPENDENT_AMBULATORY_CARE_PROVIDER_SITE_OTHER)
Admission: RE | Admit: 2011-09-04 | Discharge: 2011-09-04 | Disposition: A | Discharge status: Home / Self Care | Payer: Medicare Other | Source: Ambulatory Visit | Attending: Internal Medicine | Admitting: Internal Medicine

## 2011-09-04 ENCOUNTER — Telehealth: Payer: Self-pay | Admitting: Internal Medicine

## 2011-09-04 ENCOUNTER — Encounter: Payer: Self-pay | Admitting: Internal Medicine

## 2011-09-04 VITALS — BP 128/70 | HR 88 | Temp 98.3°F | Resp 16 | Wt 115.0 lb

## 2011-09-04 DIAGNOSIS — M79609 Pain in unspecified limb: Secondary | ICD-10-CM

## 2011-09-04 DIAGNOSIS — IMO0002 Reserved for concepts with insufficient information to code with codable children: Secondary | ICD-10-CM

## 2011-09-04 DIAGNOSIS — M5416 Radiculopathy, lumbar region: Secondary | ICD-10-CM

## 2011-09-04 DIAGNOSIS — M79606 Pain in leg, unspecified: Secondary | ICD-10-CM | POA: Insufficient documentation

## 2011-09-04 MED ORDER — TRAMADOL HCL 50 MG PO TABS: 50.0000 mg | ORAL_TABLET | Freq: Two times a day (BID) | ORAL | Status: AC | PRN

## 2011-09-04 MED ORDER — METHYLPREDNISOLONE ACETATE 80 MG/ML IJ SUSP
120.0000 mg | Freq: Once | INTRAMUSCULAR | Status: AC
  Administered 2011-09-04: 120 mg via INTRAMUSCULAR

## 2011-09-04 MED ORDER — ACYCLOVIR 800 MG PO TABS: 800.0000 mg | ORAL_TABLET | Freq: Every day | ORAL | Status: AC

## 2011-09-04 MED ORDER — PREDNISONE (PAK) 10 MG PO TABS: 10.0000 mg | ORAL_TABLET | ORAL | Status: AC

## 2011-09-04 NOTE — Assessment & Plan Note (Signed)
L anter shin pain - severe in L4 distribution Prednisone did not help 09/03/11 LUMBAR SPINE - 2-3 VIEW  Comparison: None.  Findings: There are five lumbar type vertebral bodies. There is  mild convex right scoliosis. The lateral alignment is near  anatomic. There is moderate disc space loss at L5-S1. The  additional disc spaces appear relatively preserved for age. There  are mild facet degenerative changes inferiorly. No fracture or  pars defect is identified.  IMPRESSION:  Lower lumbar spondylosis as described, most advanced at L5-S1. No  acute findings or significant malalignment.  Original Report Authenticated By: Gerrianne Scale, M.D. R/o shingles - empiric Acyclovir Depo 120 mg im X-ray L tibia

## 2011-09-04 NOTE — Progress Notes (Signed)
  Subjective:    Patient ID: [name], female    DOB: 17-Jan-1927, 77 y.o.   MRN: [id number]  HPI C/o severe L shin pain worse  Lately 6-8/10. C/o insomnia due to pain. It has been burning or throbbing   Review of Systems  Constitutional: Positive for activity change and fatigue. Negative for chills, appetite change and unexpected weight change.  HENT: Negative for congestion, mouth sores and sinus pressure.   Eyes: Negative for visual disturbance.  Respiratory: Negative for cough and chest tightness.   Gastrointestinal: Negative for nausea and abdominal pain.  Genitourinary: Negative for frequency, difficulty urinating and vaginal pain.  Musculoskeletal: Negative for back pain and gait problem.  Skin: Negative for pallor and rash.  Neurological: Negative for dizziness, tremors, weakness, numbness and headaches.  Psychiatric/Behavioral: Positive for dysphoric mood (depressed) and decreased concentration. Negative for suicidal ideas, confusion and sleep disturbance. The patient is nervous/anxious.        Objective:   Physical Exam  Constitutional: She appears well-developed and well-nourished. No distress.  HENT:  Head: Normocephalic.  Right Ear: External ear normal.  Left Ear: External ear normal.  Nose: Nose normal.  Mouth/Throat: Oropharynx is clear and moist.  Eyes: Conjunctivae are normal. Pupils are equal, round, and reactive to light. Right eye exhibits no discharge. Left eye exhibits no discharge.  Neck: Normal range of motion. Neck supple. No JVD present. No tracheal deviation present. No thyromegaly present.  Cardiovascular: Normal rate, regular rhythm and normal heart sounds.   Pulmonary/Chest: No stridor. No respiratory distress. She has no wheezes.  Abdominal: Soft. Bowel sounds are normal. She exhibits no distension and no mass. There is no tenderness. There is no rebound and no guarding.  Musculoskeletal: She exhibits no edema and no tenderness.       LS NT  B Strait leg elev neg B Thighs, knees,calves, shins NT No edema No rash  Lymphadenopathy:    She has no cervical adenopathy.  Neurological: She displays normal reflexes. No cranial nerve deficit. She exhibits normal muscle tone. Coordination normal.  Skin: No rash noted. No erythema.  Psychiatric: She has a normal mood and affect. Her behavior is normal. Judgment and thought content normal.     Wt Readings from Last 3 Encounters:  09/04/11 115 lb (52.164 kg)  08/28/11 118 lb 12.8 oz (53.887 kg)  08/11/11 118 lb (53.524 kg)        Assessment & Plan:

## 2011-09-04 NOTE — Telephone Encounter (Signed)
[name], please, inform patient's dtr that the pt's shin xray was ok Thx

## 2011-09-04 NOTE — Patient Instructions (Signed)
Hold Simvastatin 

## 2011-09-08 NOTE — Telephone Encounter (Signed)
Left detailed mess informing pt of below.  

## 2011-09-11 ENCOUNTER — Other Ambulatory Visit: Payer: Self-pay | Admitting: Internal Medicine

## 2011-09-21 ENCOUNTER — Ambulatory Visit (INDEPENDENT_AMBULATORY_CARE_PROVIDER_SITE_OTHER): Payer: Medicare Other | Admitting: Internal Medicine

## 2011-09-21 ENCOUNTER — Encounter: Payer: Self-pay | Admitting: Internal Medicine

## 2011-09-21 DIAGNOSIS — I2510 Atherosclerotic heart disease of native coronary artery without angina pectoris: Secondary | ICD-10-CM

## 2011-09-21 DIAGNOSIS — F329 Major depressive disorder, single episode, unspecified: Secondary | ICD-10-CM

## 2011-09-21 DIAGNOSIS — R413 Other amnesia: Secondary | ICD-10-CM

## 2011-09-21 MED ORDER — LISINOPRIL 5 MG PO TABS: 2.5000 mg | ORAL_TABLET | Freq: Every day | ORAL | Status: DC

## 2011-09-21 MED ORDER — MIRTAZAPINE 15 MG PO TBDP: 15.0000 mg | ORAL_TABLET | Freq: Every day | ORAL | Status: DC

## 2011-09-21 MED ORDER — AMLODIPINE BESYLATE 5 MG PO TABS: 2.5000 mg | ORAL_TABLET | Freq: Every day | ORAL | Status: DC

## 2011-09-21 NOTE — Patient Instructions (Signed)
No driving °

## 2011-09-21 NOTE — Assessment & Plan Note (Signed)
Continue with current prescription therapy as reflected on the Med list.  
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
## 2011-10-13 ENCOUNTER — Ambulatory Visit: Payer: Medicare Other | Admitting: Cardiology

## 2011-10-16 ENCOUNTER — Other Ambulatory Visit: Payer: Self-pay | Admitting: Internal Medicine

## 2011-10-27 ENCOUNTER — Ambulatory Visit: Payer: Medicare Other | Admitting: Cardiology

## 2011-11-18 ENCOUNTER — Encounter: Payer: Self-pay | Admitting: Internal Medicine

## 2011-11-18 ENCOUNTER — Ambulatory Visit (INDEPENDENT_AMBULATORY_CARE_PROVIDER_SITE_OTHER): Payer: Medicare Other | Admitting: Cardiology

## 2011-11-18 ENCOUNTER — Ambulatory Visit (INDEPENDENT_AMBULATORY_CARE_PROVIDER_SITE_OTHER): Payer: Medicare Other | Admitting: Internal Medicine

## 2011-11-18 ENCOUNTER — Encounter: Payer: Self-pay | Admitting: Cardiology

## 2011-11-18 VITALS — BP 120/60 | HR 66 | Resp 18 | Ht 63.0 in | Wt 115.8 lb

## 2011-11-18 VITALS — BP 140/80 | HR 76 | Temp 98.0°F | Resp 16 | Wt 116.0 lb

## 2011-11-18 DIAGNOSIS — F068 Other specified mental disorders due to known physiological condition: Secondary | ICD-10-CM

## 2011-11-18 DIAGNOSIS — I2510 Atherosclerotic heart disease of native coronary artery without angina pectoris: Secondary | ICD-10-CM

## 2011-11-18 DIAGNOSIS — F4321 Adjustment disorder with depressed mood: Secondary | ICD-10-CM

## 2011-11-18 DIAGNOSIS — M79609 Pain in unspecified limb: Secondary | ICD-10-CM

## 2011-11-18 DIAGNOSIS — F329 Major depressive disorder, single episode, unspecified: Secondary | ICD-10-CM

## 2011-11-18 DIAGNOSIS — M79606 Pain in leg, unspecified: Secondary | ICD-10-CM

## 2011-11-18 DIAGNOSIS — F411 Generalized anxiety disorder: Secondary | ICD-10-CM

## 2011-11-18 NOTE — Assessment & Plan Note (Signed)
Stable. Symptoms of unstable angina or acute MI to respond. No change in treatment.

## 2011-11-18 NOTE — Assessment & Plan Note (Signed)
Continue with current prescription therapy as reflected on the Med list.  

## 2011-11-18 NOTE — Progress Notes (Signed)
  Subjective:    Patient ID: [name], female    DOB: 1927-08-17, 77 y.o.   MRN: [id number]  HPI   The patient is here to follow up on chronic dementia, grief/depression, anxiety,CAD symptoms controlled with medicines, diet and exercise.   Review of Systems  Constitutional: Negative for chills, activity change, appetite change, fatigue and unexpected weight change.  HENT: Negative for congestion, mouth sores and sinus pressure.   Eyes: Negative for visual disturbance.  Respiratory: Negative for cough and chest tightness.   Gastrointestinal: Negative for nausea and abdominal pain.  Genitourinary: Negative for frequency, difficulty urinating and vaginal pain.  Musculoskeletal: Negative for back pain and gait problem.  Skin: Negative for pallor and rash.  Neurological: Negative for dizziness, tremors, weakness, numbness and headaches.  Psychiatric/Behavioral: Positive for decreased concentration. Negative for suicidal ideas, confusion and sleep disturbance. The patient is not nervous/anxious.        Objective:   Physical Exam  Constitutional: She appears well-developed. No distress.  HENT:  Head: Normocephalic.  Right Ear: External ear normal.  Left Ear: External ear normal.  Nose: Nose normal.  Mouth/Throat: Oropharynx is clear and moist.  Eyes: Conjunctivae are normal. Pupils are equal, round, and reactive to light. Right eye exhibits no discharge. Left eye exhibits no discharge.  Neck: Normal range of motion. Neck supple. No JVD present. No tracheal deviation present. No thyromegaly present.  Cardiovascular: Normal rate, regular rhythm and normal heart sounds.   Pulmonary/Chest: No stridor. No respiratory distress. She has no wheezes.  Abdominal: Soft. Bowel sounds are normal. She exhibits no distension and no mass. There is no tenderness. There is no rebound and no guarding.  Musculoskeletal: She exhibits no edema and no tenderness.  Lymphadenopathy:    She has no cervical  adenopathy.  Neurological: She displays normal reflexes. No cranial nerve deficit. She exhibits normal muscle tone. Coordination normal.  Skin: No rash noted. No erythema.  Psychiatric: Her behavior is normal. Judgment and thought content normal.       Better           Assessment & Plan:

## 2011-11-18 NOTE — Assessment & Plan Note (Signed)
Better  
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
## 2011-11-18 NOTE — Assessment & Plan Note (Signed)
Resolved

## 2011-11-18 NOTE — Patient Instructions (Signed)
Your physician wants you to follow-up in: 1 year. You will receive a reminder letter in the mail two months in advance. If you don't receive a letter, please call our office to schedule the follow-up appointment.  

## 2011-12-19 ENCOUNTER — Other Ambulatory Visit: Payer: Self-pay | Admitting: Internal Medicine

## 2012-01-05 ENCOUNTER — Other Ambulatory Visit: Payer: Self-pay | Admitting: Internal Medicine

## 2012-02-16 ENCOUNTER — Other Ambulatory Visit (INDEPENDENT_AMBULATORY_CARE_PROVIDER_SITE_OTHER): Payer: Medicare Other

## 2012-02-16 DIAGNOSIS — F411 Generalized anxiety disorder: Secondary | ICD-10-CM

## 2012-02-16 DIAGNOSIS — M79606 Pain in leg, unspecified: Secondary | ICD-10-CM

## 2012-02-16 DIAGNOSIS — F4321 Adjustment disorder with depressed mood: Secondary | ICD-10-CM

## 2012-02-16 DIAGNOSIS — M79609 Pain in unspecified limb: Secondary | ICD-10-CM

## 2012-02-16 DIAGNOSIS — F068 Other specified mental disorders due to known physiological condition: Secondary | ICD-10-CM

## 2012-02-16 DIAGNOSIS — F329 Major depressive disorder, single episode, unspecified: Secondary | ICD-10-CM

## 2012-02-16 DIAGNOSIS — I2510 Atherosclerotic heart disease of native coronary artery without angina pectoris: Secondary | ICD-10-CM

## 2012-02-16 LAB — HEPATIC FUNCTION PANEL
ALT: 17 U/L (ref 0–35)
AST: 18 U/L (ref 0–37)
Albumin: 3.7 g/dL (ref 3.5–5.2)

## 2012-02-16 LAB — LIPID PANEL
Cholesterol: 181 mg/dL (ref 0–200)
HDL: 57.4 mg/dL (ref >39.00)
Triglycerides: 113 mg/dL (ref 0.0–149.0)

## 2012-02-16 LAB — BASIC METABOLIC PANEL
BUN: 19 mg/dL (ref 6–23)
Calcium: 9.3 mg/dL (ref 8.4–10.5)
Creatinine, Ser: 0.8 mg/dL (ref 0.4–1.2)

## 2012-02-16 LAB — CBC WITH DIFFERENTIAL/PLATELET
Basophils Absolute: 0 10*3/uL (ref 0.0–0.1)
Eosinophils Absolute: 0.1 10*3/uL (ref 0.0–0.7)
Lymphocytes Relative: 26.7 % (ref 12.0–46.0)
MCHC: 32.9 g/dL (ref 30.0–36.0)
Neutrophils Relative %: 57.8 % (ref 43.0–77.0)
Platelets: 199 10*3/uL (ref 150.0–400.0)
RBC: 4.7 Mil/uL (ref 3.87–5.11)
RDW: 14.6 % (ref 11.5–14.6)

## 2012-02-16 LAB — URINALYSIS
Bilirubin Urine: NEGATIVE
Hgb urine dipstick: NEGATIVE
Nitrite: NEGATIVE
Urine Glucose: NEGATIVE
Urobilinogen, UA: 0.2 (ref 0.0–1.0)

## 2012-02-16 LAB — TSH: TSH: 3.21 u[IU]/mL (ref 0.35–5.50)

## 2012-02-23 ENCOUNTER — Ambulatory Visit (INDEPENDENT_AMBULATORY_CARE_PROVIDER_SITE_OTHER): Payer: Medicare Other | Admitting: Internal Medicine

## 2012-02-23 ENCOUNTER — Encounter: Payer: Self-pay | Admitting: Internal Medicine

## 2012-02-23 VITALS — BP 120/72 | HR 76 | Temp 97.3°F | Resp 16 | Wt 121.0 lb

## 2012-02-23 DIAGNOSIS — F411 Generalized anxiety disorder: Secondary | ICD-10-CM

## 2012-02-23 DIAGNOSIS — F068 Other specified mental disorders due to known physiological condition: Secondary | ICD-10-CM

## 2012-02-23 DIAGNOSIS — R413 Other amnesia: Secondary | ICD-10-CM

## 2012-02-23 DIAGNOSIS — E785 Hyperlipidemia, unspecified: Secondary | ICD-10-CM

## 2012-02-23 DIAGNOSIS — I10 Essential (primary) hypertension: Secondary | ICD-10-CM

## 2012-02-23 NOTE — Assessment & Plan Note (Signed)
Continue with current prescription therapy as reflected on the Med list.  

## 2012-02-23 NOTE — Assessment & Plan Note (Signed)
  On diet  

## 2012-02-23 NOTE — Assessment & Plan Note (Signed)
No worse; may be better Continue with current prescription therapy as reflected on the Med list.

## 2012-02-23 NOTE — Assessment & Plan Note (Signed)
Better now 3/13

## 2012-02-23 NOTE — Progress Notes (Signed)
Patient ID: [name], female   DOB: August 31, 1927, 78 y.o.   MRN: [id number]  Subjective:    Patient ID: [name], female    DOB: October 01, 1927, 78 y.o.   MRN: [id number]  HPI   The patient is here to follow up on chronic dementia, grief/depression, anxiety,CAD symptoms controlled with medicines  Wt Readings from Last 3 Encounters:  02/23/12 121 lb (54.885 kg)  11/18/11 116 lb (52.617 kg)  11/18/11 115 lb 12.8 oz (52.527 kg)   BP Readings from Last 3 Encounters:  02/23/12 120/72  11/18/11 140/80  11/18/11 120/60       Review of Systems  Constitutional: Negative for chills, activity change, appetite change, fatigue and unexpected weight change.  HENT: Negative for congestion, mouth sores and sinus pressure.   Eyes: Negative for visual disturbance.  Respiratory: Negative for cough and chest tightness.   Gastrointestinal: Negative for nausea and abdominal pain.  Genitourinary: Negative for frequency, difficulty urinating and vaginal pain.  Musculoskeletal: Negative for back pain and gait problem.  Skin: Negative for pallor and rash.  Neurological: Negative for dizziness, tremors, weakness, numbness and headaches.  Psychiatric/Behavioral: Positive for decreased concentration. Negative for suicidal ideas, confusion and sleep disturbance. The patient is not nervous/anxious.        Objective:   Physical Exam  Constitutional: She appears well-developed. No distress.  HENT:  Head: Normocephalic.  Right Ear: External ear normal.  Left Ear: External ear normal.  Nose: Nose normal.  Mouth/Throat: Oropharynx is clear and moist.  Eyes: Conjunctivae are normal. Pupils are equal, round, and reactive to light. Right eye exhibits no discharge. Left eye exhibits no discharge.  Neck: Normal range of motion. Neck supple. No JVD present. No tracheal deviation present. No thyromegaly present.  Cardiovascular: Normal rate, regular rhythm and normal heart sounds.   Pulmonary/Chest: No  stridor. No respiratory distress. She has no wheezes.  Abdominal: Soft. Bowel sounds are normal. She exhibits no distension and no mass. There is no tenderness. There is no rebound and no guarding.  Musculoskeletal: She exhibits no edema and no tenderness.  Lymphadenopathy:    She has no cervical adenopathy.  Neurological: She displays normal reflexes. No cranial nerve deficit. She exhibits normal muscle tone. Coordination normal.  Skin: No rash noted. No erythema.  Psychiatric: Her behavior is normal. Judgment and thought content normal.       Better      Lab Results  Component Value Date   WBC 5.6 02/16/2012   HGB 13.9 02/16/2012   HCT 42.1 02/16/2012   PLT 199.0 02/16/2012   GLUCOSE 95 02/16/2012   CHOL 181 02/16/2012   TRIG 113.0 02/16/2012   HDL 57.40 02/16/2012   LDLCALC 101* 02/16/2012   ALT 17 02/16/2012   AST 18 02/16/2012   NA 145 02/16/2012   K 4.6 02/16/2012   CL 109 02/16/2012   CREATININE 0.8 02/16/2012   BUN 19 02/16/2012   CO2 30 02/16/2012   TSH 3.21 02/16/2012        Assessment & Plan:

## 2012-03-30 ENCOUNTER — Other Ambulatory Visit: Payer: Self-pay | Admitting: Internal Medicine

## 2012-04-05 ENCOUNTER — Other Ambulatory Visit: Payer: Self-pay | Admitting: Internal Medicine

## 2012-04-05 DIAGNOSIS — Z1231 Encounter for screening mammogram for malignant neoplasm of breast: Secondary | ICD-10-CM

## 2012-04-07 ENCOUNTER — Other Ambulatory Visit: Payer: Self-pay | Admitting: Internal Medicine

## 2012-05-03 ENCOUNTER — Ambulatory Visit
Admission: RE | Admit: 2012-05-03 | Discharge: 2012-05-03 | Disposition: A | Discharge status: Home / Self Care | Payer: Medicare Other | Source: Ambulatory Visit | Attending: Internal Medicine | Admitting: Internal Medicine

## 2012-05-03 DIAGNOSIS — Z1231 Encounter for screening mammogram for malignant neoplasm of breast: Secondary | ICD-10-CM

## 2012-05-30 ENCOUNTER — Ambulatory Visit (INDEPENDENT_AMBULATORY_CARE_PROVIDER_SITE_OTHER): Payer: Medicare Other | Admitting: Internal Medicine

## 2012-05-30 ENCOUNTER — Encounter: Payer: Self-pay | Admitting: Internal Medicine

## 2012-05-30 VITALS — BP 118/70 | HR 84 | Temp 98.1°F | Resp 16 | Wt 127.0 lb

## 2012-05-30 DIAGNOSIS — M545 Low back pain, unspecified: Secondary | ICD-10-CM

## 2012-05-30 DIAGNOSIS — F329 Major depressive disorder, single episode, unspecified: Secondary | ICD-10-CM

## 2012-05-30 DIAGNOSIS — F3289 Other specified depressive episodes: Secondary | ICD-10-CM

## 2012-05-30 DIAGNOSIS — F068 Other specified mental disorders due to known physiological condition: Secondary | ICD-10-CM

## 2012-05-30 DIAGNOSIS — I10 Essential (primary) hypertension: Secondary | ICD-10-CM

## 2012-05-30 DIAGNOSIS — G8929 Other chronic pain: Secondary | ICD-10-CM

## 2012-05-30 DIAGNOSIS — F411 Generalized anxiety disorder: Secondary | ICD-10-CM

## 2012-05-30 NOTE — Assessment & Plan Note (Signed)
Continue with current prescription therapy as reflected on the Med list.  

## 2012-05-30 NOTE — Assessment & Plan Note (Signed)
[facility] 12/12 xray: IMPRESSION:  Lower lumbar spondylosis as described, most advanced at L5-S1. No  acute findings or significant malalignment.  Tylenol prn

## 2012-05-30 NOTE — Progress Notes (Signed)
Patient ID: [name], female   DOB: [birth year], 78 y.o.   MRN: [id number] Patient ID: [name], female   DOB: 02/25/1927, 78 y.o.   MRN: [id number]  Subjective:    Patient ID: [name], female    DOB: 08-Mar-1927, 78 y.o.   MRN: [id number]  Back Pain This is a chronic problem. The current episode started more than 1 year ago. The problem occurs daily. The problem has been gradually worsening since onset. The pain is present in the lumbar spine. The quality of the pain is described as aching. The pain is at a severity of 4/10. The pain is moderate. The symptoms are aggravated by bending and twisting. Pertinent negatives include no abdominal pain, headaches, numbness or weakness.     The patient is here to follow up on chronic dementia, grief/depression, anxiety,CAD symptoms controlled with medicines  Wt Readings from Last 3 Encounters:  05/30/12 127 lb (57.607 kg)  02/23/12 121 lb (54.885 kg)  11/18/11 116 lb (52.617 kg)   BP Readings from Last 3 Encounters:  05/30/12 118/70  02/23/12 120/72  11/18/11 140/80       Review of Systems  Constitutional: Negative for chills, activity change, appetite change, fatigue and unexpected weight change.  HENT: Negative for congestion, mouth sores and sinus pressure.   Eyes: Negative for visual disturbance.  Respiratory: Negative for cough and chest tightness.   Gastrointestinal: Negative for nausea and abdominal pain.  Genitourinary: Negative for frequency, difficulty urinating and vaginal pain.  Musculoskeletal: Positive for back pain. Negative for gait problem.  Skin: Negative for pallor and rash.  Neurological: Negative for dizziness, tremors, weakness, numbness and headaches.  Psychiatric/Behavioral: Positive for decreased concentration. Negative for suicidal ideas, confusion and disturbed wake/sleep cycle. The patient is not nervous/anxious.        Objective:   Physical Exam  Constitutional: She appears well-developed. No  distress.  HENT:  Head: Normocephalic.  Right Ear: External ear normal.  Left Ear: External ear normal.  Nose: Nose normal.  Mouth/Throat: Oropharynx is clear and moist.  Eyes: Conjunctivae are normal. Pupils are equal, round, and reactive to light. Right eye exhibits no discharge. Left eye exhibits no discharge.  Neck: Normal range of motion. Neck supple. No JVD present. No tracheal deviation present. No thyromegaly present.  Cardiovascular: Normal rate, regular rhythm and normal heart sounds.   Pulmonary/Chest: No stridor. No respiratory distress. She has no wheezes.  Abdominal: Soft. Bowel sounds are normal. She exhibits no distension and no mass. There is no tenderness. There is no rebound and no guarding.  Musculoskeletal: She exhibits no edema and no tenderness.  Lymphadenopathy:    She has no cervical adenopathy.  Neurological: She displays normal reflexes. No cranial nerve deficit. She exhibits normal muscle tone. Coordination normal.  Skin: No rash noted. No erythema.  Psychiatric: Her behavior is normal. Judgment and thought content normal.       Better      Lab Results  Component Value Date   WBC 5.6 02/16/2012   HGB 13.9 02/16/2012   HCT 42.1 02/16/2012   PLT 199.0 02/16/2012   GLUCOSE 95 02/16/2012   CHOL 181 02/16/2012   TRIG 113.0 02/16/2012   HDL 57.40 02/16/2012   LDLCALC 101* 02/16/2012   ALT 17 02/16/2012   AST 18 02/16/2012   NA 145 02/16/2012   K 4.6 02/16/2012   CL 109 02/16/2012   CREATININE 0.8 02/16/2012   BUN 19 02/16/2012   CO2 30 02/16/2012  TSH 3.21 02/16/2012        Assessment & Plan:

## 2012-05-30 NOTE — Assessment & Plan Note (Signed)
Continue with current prescription therapy as reflected on the Med list. Doing well 

## 2012-07-12 ENCOUNTER — Other Ambulatory Visit: Payer: Self-pay | Admitting: Internal Medicine

## 2012-07-19 ENCOUNTER — Other Ambulatory Visit: Payer: Self-pay | Admitting: Internal Medicine

## 2012-08-25 ENCOUNTER — Other Ambulatory Visit: Payer: Self-pay | Admitting: Internal Medicine

## 2012-08-25 DIAGNOSIS — Z1231 Encounter for screening mammogram for malignant neoplasm of breast: Secondary | ICD-10-CM

## 2012-08-30 ENCOUNTER — Encounter: Payer: Self-pay | Admitting: Internal Medicine

## 2012-08-30 ENCOUNTER — Ambulatory Visit (INDEPENDENT_AMBULATORY_CARE_PROVIDER_SITE_OTHER): Payer: Medicare Other | Admitting: Internal Medicine

## 2012-08-30 VITALS — BP 130/76 | HR 80 | Temp 97.7°F | Resp 16 | Wt 128.0 lb

## 2012-08-30 DIAGNOSIS — I10 Essential (primary) hypertension: Secondary | ICD-10-CM

## 2012-08-30 DIAGNOSIS — F068 Other specified mental disorders due to known physiological condition: Secondary | ICD-10-CM

## 2012-08-30 DIAGNOSIS — Z23 Encounter for immunization: Secondary | ICD-10-CM

## 2012-08-30 DIAGNOSIS — I2510 Atherosclerotic heart disease of native coronary artery without angina pectoris: Secondary | ICD-10-CM

## 2012-08-30 DIAGNOSIS — F329 Major depressive disorder, single episode, unspecified: Secondary | ICD-10-CM

## 2012-08-30 NOTE — Assessment & Plan Note (Signed)
Continue with current prescription therapy as reflected on the Med list.  

## 2012-08-30 NOTE — Progress Notes (Signed)
   Subjective:    Patient ID: [name], female    DOB: 1927/02/28, 78 y.o.   MRN: [id number]  Back Pain This is a chronic problem. The current episode started more than 1 year ago. The problem occurs daily. The problem has been gradually worsening since onset. The pain is present in the lumbar spine. The quality of the pain is described as aching. The pain is at a severity of 4/10. The pain is moderate. The symptoms are aggravated by bending and twisting. Pertinent negatives include no abdominal pain, headaches, numbness or weakness.  Off and on - not taking any meds for meds   The patient is here to follow up on chronic dementia, grief/depression, anxiety,CAD symptoms controlled with medicines  Wt Readings from Last 3 Encounters:  08/30/12 128 lb (58.06 kg)  05/30/12 127 lb (57.607 kg)  02/23/12 121 lb (54.885 kg)   BP Readings from Last 3 Encounters:  08/30/12 130/76  05/30/12 118/70  02/23/12 120/72       Review of Systems  Constitutional: Negative for chills, activity change, appetite change, fatigue and unexpected weight change.  HENT: Negative for congestion, mouth sores and sinus pressure.   Eyes: Negative for visual disturbance.  Respiratory: Negative for cough and chest tightness.   Gastrointestinal: Negative for nausea and abdominal pain.  Genitourinary: Negative for frequency, difficulty urinating and vaginal pain.  Musculoskeletal: Positive for back pain. Negative for gait problem.  Skin: Negative for pallor and rash.  Neurological: Negative for dizziness, tremors, weakness, numbness and headaches.  Psychiatric/Behavioral: Positive for decreased concentration. Negative for suicidal ideas, confusion and disturbed wake/sleep cycle. The patient is not nervous/anxious.        Objective:   Physical Exam  Constitutional: She appears well-developed. No distress.  HENT:  Head: Normocephalic.  Right Ear: External ear normal.  Left Ear: External ear normal.  Nose:  Nose normal.  Mouth/Throat: Oropharynx is clear and moist.  Eyes: Conjunctivae normal are normal. Pupils are equal, round, and reactive to light. Right eye exhibits no discharge. Left eye exhibits no discharge.  Neck: Normal range of motion. Neck supple. No JVD present. No tracheal deviation present. No thyromegaly present.  Cardiovascular: Normal rate, regular rhythm and normal heart sounds.   Pulmonary/Chest: No stridor. No respiratory distress. She has no wheezes.  Abdominal: Soft. Bowel sounds are normal. She exhibits no distension and no mass. There is no tenderness. There is no rebound and no guarding.  Musculoskeletal: She exhibits no edema and no tenderness.  Lymphadenopathy:    She has no cervical adenopathy.  Neurological: She displays normal reflexes. No cranial nerve deficit. She exhibits normal muscle tone. Coordination normal.  Skin: No rash noted. No erythema.  Psychiatric: Her behavior is normal. Judgment and thought content normal.       Better      Lab Results  Component Value Date   WBC 5.6 02/16/2012   HGB 13.9 02/16/2012   HCT 42.1 02/16/2012   PLT 199.0 02/16/2012   GLUCOSE 95 02/16/2012   CHOL 181 02/16/2012   TRIG 113.0 02/16/2012   HDL 57.40 02/16/2012   LDLCALC 101* 02/16/2012   ALT 17 02/16/2012   AST 18 02/16/2012   NA 145 02/16/2012   K 4.6 02/16/2012   CL 109 02/16/2012   CREATININE 0.8 02/16/2012   BUN 19 02/16/2012   CO2 30 02/16/2012   TSH 3.21 02/16/2012        Assessment & Plan:

## 2012-09-09 ENCOUNTER — Other Ambulatory Visit: Payer: Self-pay | Admitting: Internal Medicine

## 2012-09-30 ENCOUNTER — Other Ambulatory Visit: Payer: Self-pay | Admitting: Internal Medicine

## 2012-11-10 ENCOUNTER — Ambulatory Visit: Payer: Medicare Other | Admitting: Cardiology

## 2012-12-21 ENCOUNTER — Ambulatory Visit: Payer: Medicare Other | Admitting: Cardiology

## 2012-12-21 ENCOUNTER — Encounter: Payer: Self-pay | Admitting: *Deleted

## 2012-12-28 ENCOUNTER — Ambulatory Visit: Payer: Medicare Other | Admitting: Cardiology

## 2013-01-03 ENCOUNTER — Encounter: Payer: Self-pay | Admitting: Internal Medicine

## 2013-01-03 ENCOUNTER — Ambulatory Visit (INDEPENDENT_AMBULATORY_CARE_PROVIDER_SITE_OTHER): Payer: Medicare Other | Admitting: Internal Medicine

## 2013-01-03 ENCOUNTER — Other Ambulatory Visit (INDEPENDENT_AMBULATORY_CARE_PROVIDER_SITE_OTHER): Payer: Medicare Other

## 2013-01-03 VITALS — BP 148/78 | HR 80 | Temp 97.1°F | Resp 16 | Wt 130.0 lb

## 2013-01-03 DIAGNOSIS — I10 Essential (primary) hypertension: Secondary | ICD-10-CM

## 2013-01-03 DIAGNOSIS — R413 Other amnesia: Secondary | ICD-10-CM

## 2013-01-03 DIAGNOSIS — F068 Other specified mental disorders due to known physiological condition: Secondary | ICD-10-CM

## 2013-01-03 DIAGNOSIS — R202 Paresthesia of skin: Secondary | ICD-10-CM

## 2013-01-03 DIAGNOSIS — R209 Unspecified disturbances of skin sensation: Secondary | ICD-10-CM

## 2013-01-03 DIAGNOSIS — G8929 Other chronic pain: Secondary | ICD-10-CM

## 2013-01-03 DIAGNOSIS — R42 Dizziness and giddiness: Secondary | ICD-10-CM

## 2013-01-03 DIAGNOSIS — M545 Low back pain: Secondary | ICD-10-CM

## 2013-01-03 LAB — VITAMIN B12: Vitamin B-12: 445 pg/mL (ref 211–911)

## 2013-01-03 LAB — HEPATIC FUNCTION PANEL
Albumin: 4.3 g/dL (ref 3.5–5.2)
Alkaline Phosphatase: 68 U/L (ref 39–117)
Bilirubin, Direct: 0.1 mg/dL (ref 0.0–0.3)
Total Protein: 7.5 g/dL (ref 6.0–8.3)

## 2013-01-03 LAB — BASIC METABOLIC PANEL
Chloride: 107 mEq/L (ref 96–112)
GFR: 62.69 mL/min (ref >60.00)
Potassium: 5.1 mEq/L (ref 3.5–5.1)

## 2013-01-03 LAB — TSH: TSH: 2.94 u[IU]/mL (ref 0.35–5.50)

## 2013-01-03 NOTE — Assessment & Plan Note (Signed)
Better  

## 2013-01-03 NOTE — Assessment & Plan Note (Signed)
Chronic - on Aricept and Namenda Not driving She has a CO and smoke detectors 

## 2013-01-03 NOTE — Progress Notes (Signed)
   Subjective:     Back Pain This is a chronic problem. The current episode started more than 1 year ago. The problem occurs daily. The problem has been gradually worsening since onset. The pain is present in the lumbar spine. The quality of the pain is described as aching. The pain is at a severity of 4/10. The pain is moderate. The symptoms are aggravated by bending and twisting. Pertinent negatives include no abdominal pain, headaches, numbness or weakness.  Off and on - not taking any meds for meds   The patient is here to follow up on chronic dementia, grief/depression, anxiety,CAD symptoms controlled with medicines   Wt Readings from Last 3 Encounters:  01/03/13 130 lb (58.968 kg)  08/30/12 128 lb (58.06 kg)  05/30/12 127 lb (57.607 kg)   BP Readings from Last 3 Encounters:  01/03/13 148/78  08/30/12 130/76  05/30/12 118/70       Review of Systems  Constitutional: Negative for chills, activity change, appetite change, fatigue and unexpected weight change.  HENT: Negative for congestion, mouth sores and sinus pressure.   Eyes: Negative for visual disturbance.  Respiratory: Negative for cough and chest tightness.   Gastrointestinal: Negative for nausea and abdominal pain.  Genitourinary: Negative for frequency, difficulty urinating and vaginal pain.  Musculoskeletal: Positive for back pain. Negative for gait problem.  Skin: Negative for pallor and rash.  Neurological: Negative for dizziness, tremors, weakness, numbness and headaches.  Psychiatric/Behavioral: Positive for decreased concentration. Negative for suicidal ideas, confusion and sleep disturbance. The patient is not nervous/anxious.        Objective:   Physical Exam  Constitutional: She appears well-developed. No distress.  HENT:  Head: Normocephalic.  Right Ear: External ear normal.  Left Ear: External ear normal.  Nose: Nose normal.  Mouth/Throat: Oropharynx is clear and moist.  Eyes: Conjunctivae  normal are normal. Pupils are equal, round, and reactive to light. Right eye exhibits no discharge. Left eye exhibits no discharge.  Neck: Normal range of motion. Neck supple. No JVD present. No tracheal deviation present. No thyromegaly present.  Cardiovascular: Normal rate, regular rhythm and normal heart sounds.   Pulmonary/Chest: No stridor. No respiratory distress. She has no wheezes.  Abdominal: Soft. Bowel sounds are normal. She exhibits no distension and no mass. There is no tenderness. There is no rebound and no guarding.  Musculoskeletal: She exhibits no edema and no tenderness.  Lymphadenopathy:    She has no cervical adenopathy.  Neurological: She displays normal reflexes. No cranial nerve deficit. She exhibits normal muscle tone. Coordination normal.  Skin: No rash noted. No erythema.  Psychiatric: Her behavior is normal. Judgment and thought content normal.       Better      Lab Results  Component Value Date   WBC 5.6 02/16/2012   HGB 13.9 02/16/2012   HCT 42.1 02/16/2012   PLT 199.0 02/16/2012   GLUCOSE 95 02/16/2012   CHOL 181 02/16/2012   TRIG 113.0 02/16/2012   HDL 57.40 02/16/2012   LDLCALC 101* 02/16/2012   ALT 17 02/16/2012   AST 18 02/16/2012   NA 145 02/16/2012   K 4.6 02/16/2012   CL 109 02/16/2012   CREATININE 0.8 02/16/2012   BUN 19 02/16/2012   CO2 30 02/16/2012   TSH 3.21 02/16/2012        Assessment & Plan:

## 2013-01-03 NOTE — Assessment & Plan Note (Signed)
Continue with current prescription therapy as reflected on the Med list. Discussed  

## 2013-01-03 NOTE — Addendum Note (Signed)
Addended by: Tresa Garter on: 01/03/2013 02:34 PM   Modules accepted: Orders

## 2013-01-03 NOTE — Assessment & Plan Note (Signed)
Continue with current prn OTC therapy as reflected on the Med list.   

## 2013-01-03 NOTE — Assessment & Plan Note (Signed)
Continue with current prescription therapy as reflected on the Med list.  

## 2013-01-13 ENCOUNTER — Other Ambulatory Visit: Payer: Self-pay | Admitting: Internal Medicine

## 2013-03-07 ENCOUNTER — Ambulatory Visit: Payer: Medicare Other | Admitting: Cardiology

## 2013-03-21 ENCOUNTER — Encounter: Payer: Self-pay | Admitting: Cardiology

## 2013-03-21 ENCOUNTER — Ambulatory Visit (INDEPENDENT_AMBULATORY_CARE_PROVIDER_SITE_OTHER): Payer: Medicare Other | Admitting: Cardiology

## 2013-03-21 VITALS — BP 126/72 | HR 69 | Ht 63.0 in | Wt 132.0 lb

## 2013-03-21 DIAGNOSIS — I2510 Atherosclerotic heart disease of native coronary artery without angina pectoris: Secondary | ICD-10-CM

## 2013-03-21 DIAGNOSIS — I10 Essential (primary) hypertension: Secondary | ICD-10-CM

## 2013-03-21 DIAGNOSIS — E785 Hyperlipidemia, unspecified: Secondary | ICD-10-CM

## 2013-03-21 NOTE — Progress Notes (Signed)
HPI [name] returns today for evaluation and management her history of coronary artery disease. She's having no angina or chest discomfort. Her medications are stable and she is very compliant. Her biggest complaint is that of losing some memory. She recently started Namenda.  Past Medical History  Diagnosis Date  . CORONARY ARTERY DISEASE   . HYPERTENSION   . HYPERLIPIDEMIA   . DEPRESSION   . HERPES ZOSTER   . VERTIGO   . DIVERTICULOSIS, COLON   . OSTEOPOROSIS   . ANXIETY   . DEMENTIA   . Myocardial infarction, anterior [name]     Current Outpatient Prescriptions  Medication Sig Dispense Refill  . amLODipine (NORVASC) 5 MG tablet TAKE ONE-HALF TABLET BY MOUTH EVERY DAY  30 tablet  10  . aspirin 81 MG tablet Take 81 mg by mouth daily.        [name] b complex vitamins tablet Take 1 tablet by mouth daily.        [name] buPROPion (WELLBUTRIN SR) 150 MG 12 hr tablet TAKE ONE TABLET BY MOUTH IN THE MORNING  90 tablet  3  . BYSTOLIC 10 MG tablet TAKE ONE TABLET BY MOUTH EVERY DAY.  60 tablet  0  . Cholecalciferol (VITAMIN D) 1000 UNITS capsule 2 capsules once daily       . donepezil (ARICEPT) 10 MG tablet TAKE ONE TABLET BY MOUTH EVERY DAY FOR MEMORY  90 tablet  3  . fluticasone (FLONASE) 50 MCG/ACT nasal spray 2 sprays by Nasal route daily.        [name] ipratropium (ATROVENT) 0.06 % nasal spray Place 2 sprays into the nose 4 (four) times daily.      [name] lisinopril (PRINIVIL,ZESTRIL) 5 MG tablet TAKE ONE TABLET BY MOUTH EVERY DAY  30 tablet  11  . loratadine (CLARITIN) 10 MG tablet Take 10 mg by mouth daily.        . mirtazapine (REMERON SOL-TAB) 15 MG disintegrating tablet Take 15 mg by mouth at bedtime.        . mirtazapine (REMERON) 15 MG tablet TAKE ONE TABLET BY MOUTH AT BEDTIME AROUND 6-7 PM  30 tablet  4  . NAMENDA 10 MG tablet TAKE ONE TABLET BY MOUTH TWICE DAILY  60 each  5  . triamcinolone (KENALOG) 0.5 % cream Apply topically 2 (two) times daily.         No current facility-administered  medications for this visit.    Allergies  Allergen Reactions  . Simvastatin     REACTION: memory problem, myalgia    Family History  Problem Relation Age of Onset  . Hypertension Other   . Heart disease Other   . Coronary artery disease Other     History   Social History  . Marital Status: Widowed    Spouse Name: N/A    Number of Children: N/A  . Years of Education: N/A   Occupational History  .      Retired   Social History Main Topics  . Smoking status: Never Smoker   . Smokeless tobacco: Not on file  . Alcohol Use: No  . Drug Use: No  . Sexually Active: No   Other Topics Concern  . Not on file   Social History Narrative   Regular exercise-yes    ROS ALL NEGATIVE EXCEPT THOSE NOTED IN HPI  PE  General Appearance: well developed, well nourished in no acute distress, looks younger than stated age. HEENT: symmetrical face, PERRLA, good dentition  Neck:  no JVD, thyromegaly, or adenopathy, trachea midline Chest: symmetric without deformity Cardiac: PMI non-displaced, RRR, normal S1, S2, no gallop or murmur Lung: clear to ausculation and percussion Vascular: all pulses full without bruits  Abdominal: nondistended, nontender, good bowel sounds, no HSM, no bruits Extremities: no cyanosis, clubbing or edema, no sign of DVT, no varicosities  Skin: normal color, no rashes Neuro: alert and oriented x 3, non-focal Pysch: normal affect  EKG Normal sinus rhythm, poor R wave progression, no acute changes BMET    Component Value Date/Time   NA 140 01/03/2013 1440   K 5.1 01/03/2013 1440   CL 107 01/03/2013 1440   CO2 26 01/03/2013 1440   GLUCOSE 95 01/03/2013 1440   BUN 19 01/03/2013 1440   CREATININE 0.9 01/03/2013 1440   CALCIUM 9.6 01/03/2013 1440   GFRNONAA >60 07/11/2011 0951   GFRAA >60 07/11/2011 0951    Lipid Panel     Component Value Date/Time   CHOL 181 02/16/2012 0919   TRIG 113.0 02/16/2012 0919   HDL 57.40 02/16/2012 0919   CHOLHDL 3 02/16/2012 0919   VLDL  22.6 02/16/2012 0919   LDLCALC 101* 02/16/2012 0919    CBC    Component Value Date/Time   WBC 5.6 02/16/2012 0919   RBC 4.70 02/16/2012 0919   HGB 13.9 02/16/2012 0919   HCT 42.1 02/16/2012 0919   PLT 199.0 02/16/2012 0919   MCV 89.6 02/16/2012 0919   MCH 29.2 07/11/2011 0951   MCHC 32.9 02/16/2012 0919   RDW 14.6 02/16/2012 0919   LYMPHSABS 1.5 02/16/2012 0919   MONOABS 0.7 02/16/2012 0919   EOSABS 0.1 02/16/2012 0919   BASOSABS 0.0 02/16/2012 0919

## 2013-03-21 NOTE — Patient Instructions (Addendum)
Your physician wants you to follow-up in: 1 year. You will receive a reminder letter in the mail two months in advance. If you don't receive a letter, please call our office to schedule the follow-up appointment.  

## 2013-03-21 NOTE — Assessment & Plan Note (Signed)
Stable. Continue secondary preventative therapy. Return the office in one year with Dr. Delton See.

## 2013-03-22 ENCOUNTER — Other Ambulatory Visit: Payer: Self-pay | Admitting: Internal Medicine

## 2013-04-04 ENCOUNTER — Encounter: Payer: Self-pay | Admitting: Internal Medicine

## 2013-04-04 ENCOUNTER — Ambulatory Visit (INDEPENDENT_AMBULATORY_CARE_PROVIDER_SITE_OTHER): Payer: Medicare Other | Admitting: Internal Medicine

## 2013-04-04 ENCOUNTER — Ambulatory Visit (INDEPENDENT_AMBULATORY_CARE_PROVIDER_SITE_OTHER): Payer: Medicare Other

## 2013-04-04 ENCOUNTER — Ambulatory Visit (INDEPENDENT_AMBULATORY_CARE_PROVIDER_SITE_OTHER)
Admission: RE | Admit: 2013-04-04 | Discharge: 2013-04-04 | Disposition: A | Discharge status: Home / Self Care | Payer: Medicare Other | Source: Ambulatory Visit | Attending: Internal Medicine | Admitting: Internal Medicine

## 2013-04-04 VITALS — BP 150/80 | HR 76 | Temp 98.0°F | Resp 16 | Wt 132.0 lb

## 2013-04-04 DIAGNOSIS — G8929 Other chronic pain: Secondary | ICD-10-CM

## 2013-04-04 DIAGNOSIS — R6883 Chills (without fever): Secondary | ICD-10-CM

## 2013-04-04 DIAGNOSIS — R413 Other amnesia: Secondary | ICD-10-CM

## 2013-04-04 DIAGNOSIS — R209 Unspecified disturbances of skin sensation: Secondary | ICD-10-CM

## 2013-04-04 DIAGNOSIS — R202 Paresthesia of skin: Secondary | ICD-10-CM

## 2013-04-04 DIAGNOSIS — R5381 Other malaise: Secondary | ICD-10-CM

## 2013-04-04 DIAGNOSIS — M545 Low back pain: Secondary | ICD-10-CM

## 2013-04-04 DIAGNOSIS — R5383 Other fatigue: Secondary | ICD-10-CM

## 2013-04-04 LAB — CBC WITH DIFFERENTIAL/PLATELET
Basophils Absolute: 0.1 10*3/uL (ref 0.0–0.1)
Eosinophils Relative: 2.5 % (ref 0.0–5.0)
Hemoglobin: 14.2 g/dL (ref 12.0–15.0)
Lymphocytes Relative: 26.4 % (ref 12.0–46.0)
Monocytes Relative: 12.3 % — ABNORMAL HIGH (ref 3.0–12.0)
Platelets: 191 10*3/uL (ref 150.0–400.0)
RDW: 14.7 % — ABNORMAL HIGH (ref 11.5–14.6)
WBC: 6.6 10*3/uL (ref 4.5–10.5)

## 2013-04-04 LAB — URINALYSIS, ROUTINE W REFLEX MICROSCOPIC
Bilirubin Urine: NEGATIVE
Ketones, ur: NEGATIVE
Specific Gravity, Urine: 1.01 (ref 1.000–1.030)
Urine Glucose: NEGATIVE
pH: 6.5 (ref 5.0–8.0)

## 2013-04-04 MED ORDER — MEMANTINE HCL ER 28 MG PO CP24: 1.0000 | ORAL_CAPSULE | Freq: Every day | ORAL | Status: DC

## 2013-04-04 NOTE — Progress Notes (Signed)
   Subjective:     Back Pain This is a chronic problem. The current episode started more than 1 year ago. The problem occurs daily. The problem has been gradually worsening since onset. The pain is present in the lumbar spine. The quality of the pain is described as aching. The pain is at a severity of 4/10. The pain is moderate. The symptoms are aggravated by bending and twisting. Pertinent negatives include no abdominal pain, headaches, numbness or weakness.  LBP is worse C/o being cold all the time, tired, worse confusion x 1-2 wks   The patient is here to follow up on chronic dementia, grief/depression, anxiety,CAD symptoms controlled with medicines   Wt Readings from Last 3 Encounters:  04/04/13 132 lb (59.875 kg)  03/21/13 132 lb (59.875 kg)  01/03/13 130 lb (58.968 kg)   BP Readings from Last 3 Encounters:  04/04/13 150/80  03/21/13 126/72  01/03/13 148/78       Review of Systems  Constitutional: Negative for chills, activity change, appetite change, fatigue and unexpected weight change.  HENT: Negative for congestion, mouth sores and sinus pressure.   Eyes: Negative for visual disturbance.  Respiratory: Negative for cough and chest tightness.   Gastrointestinal: Negative for nausea and abdominal pain.  Genitourinary: Negative for frequency, difficulty urinating and vaginal pain.  Musculoskeletal: Positive for back pain. Negative for gait problem.  Skin: Negative for pallor and rash.  Neurological: Negative for dizziness, tremors, weakness, numbness and headaches.  Psychiatric/Behavioral: Positive for decreased concentration. Negative for suicidal ideas, confusion and sleep disturbance. The patient is not nervous/anxious.        Objective:   Physical Exam  Constitutional: She appears well-developed. No distress.  HENT:  Head: Normocephalic.  Right Ear: External ear normal.  Left Ear: External ear normal.  Nose: Nose normal.  Mouth/Throat: Oropharynx is clear  and moist.  Eyes: Conjunctivae are normal. Pupils are equal, round, and reactive to light. Right eye exhibits no discharge. Left eye exhibits no discharge.  Neck: Normal range of motion. Neck supple. No JVD present. No tracheal deviation present. No thyromegaly present.  Cardiovascular: Normal rate, regular rhythm and normal heart sounds.   Pulmonary/Chest: No stridor. No respiratory distress. She has no wheezes.  Abdominal: Soft. Bowel sounds are normal. She exhibits no distension and no mass. There is no tenderness. There is no rebound and no guarding.  Musculoskeletal: She exhibits no edema and no tenderness.  Lymphadenopathy:    She has no cervical adenopathy.  Neurological: She displays normal reflexes. No cranial nerve deficit. She exhibits normal muscle tone. Coordination normal.  Skin: No rash noted. No erythema.  Psychiatric: Her behavior is normal. Judgment and thought content normal.  Better      Lab Results  Component Value Date   WBC 5.6 02/16/2012   HGB 13.9 02/16/2012   HCT 42.1 02/16/2012   PLT 199.0 02/16/2012   GLUCOSE 95 01/03/2013   CHOL 181 02/16/2012   TRIG 113.0 02/16/2012   HDL 57.40 02/16/2012   LDLCALC 101* 02/16/2012   ALT 15 01/03/2013   AST 22 01/03/2013   NA 140 01/03/2013   K 5.1 01/03/2013   CL 107 01/03/2013   CREATININE 0.9 01/03/2013   BUN 19 01/03/2013   CO2 26 01/03/2013   TSH 2.94 01/03/2013        Assessment & Plan:

## 2013-04-04 NOTE — Assessment & Plan Note (Signed)
5/14 worse Xray

## 2013-04-04 NOTE — Assessment & Plan Note (Signed)
UA and labs

## 2013-04-04 NOTE — Assessment & Plan Note (Signed)
labs

## 2013-04-04 NOTE — Assessment & Plan Note (Signed)
Worse - r/o UTI

## 2013-04-05 LAB — BASIC METABOLIC PANEL
BUN: 15 mg/dL (ref 6–23)
CO2: 30 mEq/L (ref 19–32)
GFR: 61.1 mL/min (ref >60.00)
Glucose, Bld: 89 mg/dL (ref 70–99)
Potassium: 4.4 mEq/L (ref 3.5–5.1)
Sodium: 138 mEq/L (ref 135–145)

## 2013-04-05 LAB — TSH: TSH: 2.47 u[IU]/mL (ref 0.35–5.50)

## 2013-04-05 LAB — VITAMIN B12: Vitamin B-12: 481 pg/mL (ref 211–911)

## 2013-04-15 ENCOUNTER — Other Ambulatory Visit: Payer: Self-pay | Admitting: Internal Medicine

## 2013-05-11 ENCOUNTER — Encounter: Payer: Self-pay | Admitting: Internal Medicine

## 2013-05-11 ENCOUNTER — Ambulatory Visit (INDEPENDENT_AMBULATORY_CARE_PROVIDER_SITE_OTHER): Payer: Medicare Other | Admitting: Internal Medicine

## 2013-05-11 VITALS — BP 138/70 | HR 72 | Temp 98.2°F | Resp 16 | Ht 63.0 in | Wt 131.0 lb

## 2013-05-11 DIAGNOSIS — F329 Major depressive disorder, single episode, unspecified: Secondary | ICD-10-CM

## 2013-05-11 DIAGNOSIS — Z23 Encounter for immunization: Secondary | ICD-10-CM

## 2013-05-11 DIAGNOSIS — G8929 Other chronic pain: Secondary | ICD-10-CM

## 2013-05-11 DIAGNOSIS — M810 Age-related osteoporosis without current pathological fracture: Secondary | ICD-10-CM

## 2013-05-11 DIAGNOSIS — I2510 Atherosclerotic heart disease of native coronary artery without angina pectoris: Secondary | ICD-10-CM

## 2013-05-11 DIAGNOSIS — E785 Hyperlipidemia, unspecified: Secondary | ICD-10-CM

## 2013-05-11 DIAGNOSIS — Z0000 Encounter for general adult medical examination without abnormal findings: Secondary | ICD-10-CM

## 2013-05-11 DIAGNOSIS — I10 Essential (primary) hypertension: Secondary | ICD-10-CM

## 2013-05-11 DIAGNOSIS — F3289 Other specified depressive episodes: Secondary | ICD-10-CM

## 2013-05-11 DIAGNOSIS — R413 Other amnesia: Secondary | ICD-10-CM

## 2013-05-11 DIAGNOSIS — M545 Low back pain, unspecified: Secondary | ICD-10-CM

## 2013-05-11 DIAGNOSIS — F068 Other specified mental disorders due to known physiological condition: Secondary | ICD-10-CM

## 2013-05-11 NOTE — Assessment & Plan Note (Signed)
Continue with current prescription therapy as reflected on the Med list.  

## 2013-05-11 NOTE — Assessment & Plan Note (Signed)
Prn meds 

## 2013-05-11 NOTE — Assessment & Plan Note (Addendum)
Here for medicare wellness/physical  Diet: heart healthy  Physical activity: sedentary  Depression/mood screen: negative  Hearing: intact to whispered voice  Visual acuity: grossly normal, performs annual eye exam  ADLs: capable  Fall risk: none  Home safety: good  Cognitive evaluation: intact to orientation, naming, recall and repetition  EOL planning: adv directives, full code/ I agree  I have personally reviewed and have noted  1. The patient's medical and social history  2. Their use of alcohol, tobacco or illicit drugs  3. Their current medications and supplements  4. The patient's functional ability including ADL's, fall risks, home safety risks and hearing or visual impairment.  5. Diet and physical activities  6. Evidence for depression or mood disorders    Today patient counseled on age appropriate routine health concerns for screening and prevention, each reviewed and up to date or declined. Immunizations reviewed and up to date or declined. Labs ordered and reviewed. Risk factors for depression reviewed and negative. Hearing function and visual acuity are intact. ADLs screened and addressed as needed. Functional ability and level of safety reviewed and appropriate. Education, counseling and referrals performed based on assessed risks today. Patient provided with a copy of personalized plan for preventive services. No need for colonocopy GYN, mammo is scheduled dT

## 2013-05-11 NOTE — Progress Notes (Signed)
Subjective:   The patient is here for a wellness exam. The patient has been doing well overall without major physical or psychological issues going on lately.    Back Pain This is a chronic problem. The current episode started more than 1 year ago. The problem occurs daily. The problem has been waxing and waning since onset. The pain is present in the lumbar spine. The quality of the pain is described as aching. The pain is at a severity of 4/10. The pain is moderate. The symptoms are aggravated by bending and twisting. Pertinent negatives include no abdominal pain, headaches, numbness or weakness. The treatment provided mild relief.  Off and on - not taking any meds for meds   The patient is here to follow up on chronic dementia, grief/depression, anxiety,CAD symptoms controlled with medicines   Wt Readings from Last 3 Encounters:  05/11/13 131 lb (59.421 kg)  04/04/13 132 lb (59.875 kg)  03/21/13 132 lb (59.875 kg)   BP Readings from Last 3 Encounters:  05/11/13 138/70  04/04/13 150/80  03/21/13 126/72       Review of Systems  Constitutional: Negative for chills, activity change, appetite change, fatigue and unexpected weight change.  HENT: Negative for congestion, mouth sores and sinus pressure.   Eyes: Negative for visual disturbance.  Respiratory: Negative for cough and chest tightness.   Gastrointestinal: Negative for nausea and abdominal pain.  Genitourinary: Negative for frequency, difficulty urinating and vaginal pain.  Musculoskeletal: Positive for back pain. Negative for gait problem.  Skin: Negative for pallor and rash.  Neurological: Negative for dizziness, tremors, weakness, numbness and headaches.  Psychiatric/Behavioral: Positive for decreased concentration. Negative for suicidal ideas, confusion and sleep disturbance. The patient is not nervous/anxious.        Objective:   Physical Exam  Constitutional: She appears well-developed. No distress.  HENT:   Head: Normocephalic.  Right Ear: External ear normal.  Left Ear: External ear normal.  Nose: Nose normal.  Mouth/Throat: Oropharynx is clear and moist.  Eyes: Conjunctivae are normal. Pupils are equal, round, and reactive to light. Right eye exhibits no discharge. Left eye exhibits no discharge.  Neck: Normal range of motion. Neck supple. No JVD present. No tracheal deviation present. No thyromegaly present.  Cardiovascular: Normal rate, regular rhythm and normal heart sounds.   Pulmonary/Chest: No stridor. No respiratory distress. She has no wheezes.  Abdominal: Soft. Bowel sounds are normal. She exhibits no distension and no mass. There is no tenderness. There is no rebound and no guarding.  Musculoskeletal: She exhibits no edema and no tenderness.  Lymphadenopathy:    She has no cervical adenopathy.  Neurological: She displays normal reflexes. No cranial nerve deficit. She exhibits normal muscle tone. Coordination normal.  Skin: No rash noted. No erythema.  Psychiatric: Her behavior is normal. Judgment and thought content normal.  Better      Lab Results  Component Value Date   WBC 6.6 04/04/2013   HGB 14.2 04/04/2013   HCT 42.3 04/04/2013   PLT 191.0 04/04/2013   GLUCOSE 89 04/04/2013   CHOL 181 02/16/2012   TRIG 113.0 02/16/2012   HDL 57.40 02/16/2012   LDLCALC 101* 02/16/2012   ALT 15 01/03/2013   AST 22 01/03/2013   NA 138 04/04/2013   K 4.4 04/04/2013   CL 105 04/04/2013   CREATININE 0.9 04/04/2013   BUN 15 04/04/2013   CO2 30 04/04/2013   TSH 2.47 04/04/2013        Assessment & Plan:

## 2013-05-11 NOTE — Assessment & Plan Note (Signed)
Chronic -- statin intolerant 

## 2013-05-11 NOTE — Assessment & Plan Note (Signed)
Vit d - re-start 

## 2013-05-23 ENCOUNTER — Other Ambulatory Visit: Payer: Self-pay | Admitting: Internal Medicine

## 2013-06-07 ENCOUNTER — Ambulatory Visit
Admission: RE | Admit: 2013-06-07 | Discharge: 2013-06-07 | Disposition: A | Discharge status: Home / Self Care | Payer: Medicare Other | Source: Ambulatory Visit | Attending: Internal Medicine | Admitting: Internal Medicine

## 2013-06-07 DIAGNOSIS — Z1231 Encounter for screening mammogram for malignant neoplasm of breast: Secondary | ICD-10-CM

## 2013-08-09 ENCOUNTER — Other Ambulatory Visit: Payer: Self-pay | Admitting: Internal Medicine

## 2013-08-30 ENCOUNTER — Other Ambulatory Visit: Payer: Self-pay | Admitting: Internal Medicine

## 2013-09-12 ENCOUNTER — Other Ambulatory Visit (INDEPENDENT_AMBULATORY_CARE_PROVIDER_SITE_OTHER): Payer: Medicare Other

## 2013-09-12 ENCOUNTER — Encounter: Payer: Self-pay | Admitting: Internal Medicine

## 2013-09-12 ENCOUNTER — Ambulatory Visit (INDEPENDENT_AMBULATORY_CARE_PROVIDER_SITE_OTHER): Payer: Medicare Other | Admitting: Internal Medicine

## 2013-09-12 VITALS — BP 158/92 | HR 80 | Temp 98.4°F | Resp 16 | Wt 134.0 lb

## 2013-09-12 DIAGNOSIS — G8929 Other chronic pain: Secondary | ICD-10-CM

## 2013-09-12 DIAGNOSIS — F068 Other specified mental disorders due to known physiological condition: Secondary | ICD-10-CM

## 2013-09-12 DIAGNOSIS — Z23 Encounter for immunization: Secondary | ICD-10-CM

## 2013-09-12 DIAGNOSIS — I10 Essential (primary) hypertension: Secondary | ICD-10-CM

## 2013-09-12 DIAGNOSIS — F329 Major depressive disorder, single episode, unspecified: Secondary | ICD-10-CM

## 2013-09-12 DIAGNOSIS — M545 Low back pain: Secondary | ICD-10-CM

## 2013-09-12 LAB — URINALYSIS, ROUTINE W REFLEX MICROSCOPIC
Bilirubin Urine: NEGATIVE
Ketones, ur: NEGATIVE
Nitrite: NEGATIVE
Specific Gravity, Urine: 1.02 (ref 1.000–1.030)
Urine Glucose: NEGATIVE
Urobilinogen, UA: 0.2 (ref 0.0–1.0)

## 2013-09-12 LAB — BASIC METABOLIC PANEL
BUN: 18 mg/dL (ref 6–23)
CO2: 27 mEq/L (ref 19–32)
Calcium: 9.5 mg/dL (ref 8.4–10.5)
Creatinine, Ser: 0.8 mg/dL (ref 0.4–1.2)
GFR: 70.58 mL/min (ref >60.00)
Glucose, Bld: 97 mg/dL (ref 70–99)
Potassium: 4.1 mEq/L (ref 3.5–5.1)
Sodium: 141 mEq/L (ref 135–145)

## 2013-09-12 NOTE — Progress Notes (Signed)
Subjective:   The patient is here for a wellness exam. The patient has been doing well overall without major physical or psychological issues going on lately.    Back Pain This is a chronic problem. The current episode started more than 1 year ago. The problem occurs daily. The problem has been waxing and waning since onset. The pain is present in the lumbar spine. The quality of the pain is described as aching. The pain is at a severity of 4/10. The pain is moderate. The symptoms are aggravated by bending and twisting. Pertinent negatives include no abdominal pain, headaches, numbness or weakness. The treatment provided mild relief.  Off and on - not taking any meds for LBP   The patient is here to follow up on chronic dementia, grief/depression, anxiety,CAD symptoms controlled with medicines   Wt Readings from Last 3 Encounters:  09/12/13 134 lb (60.782 kg)  05/11/13 131 lb (59.421 kg)  04/04/13 132 lb (59.875 kg)   BP Readings from Last 3 Encounters:  09/12/13 158/92  05/11/13 138/70  04/04/13 150/80       Review of Systems  Constitutional: Negative for chills, activity change, appetite change, fatigue and unexpected weight change.  HENT: Negative for congestion, mouth sores and sinus pressure.   Eyes: Negative for visual disturbance.  Respiratory: Negative for cough and chest tightness.   Gastrointestinal: Negative for nausea and abdominal pain.  Genitourinary: Negative for frequency, difficulty urinating and vaginal pain.  Musculoskeletal: Positive for back pain. Negative for gait problem.  Skin: Negative for pallor and rash.  Neurological: Negative for dizziness, tremors, weakness, numbness and headaches.  Psychiatric/Behavioral: Positive for decreased concentration. Negative for suicidal ideas, confusion and sleep disturbance. The patient is not nervous/anxious.        Objective:   Physical Exam  Constitutional: She appears well-developed. No distress.  HENT:   Head: Normocephalic.  Right Ear: External ear normal.  Left Ear: External ear normal.  Nose: Nose normal.  Mouth/Throat: Oropharynx is clear and moist.  Eyes: Conjunctivae are normal. Pupils are equal, round, and reactive to light. Right eye exhibits no discharge. Left eye exhibits no discharge.  Neck: Normal range of motion. Neck supple. No JVD present. No tracheal deviation present. No thyromegaly present.  Cardiovascular: Normal rate, regular rhythm and normal heart sounds.   Pulmonary/Chest: No stridor. No respiratory distress. She has no wheezes.  Abdominal: Soft. Bowel sounds are normal. She exhibits no distension and no mass. There is no tenderness. There is no rebound and no guarding.  Musculoskeletal: She exhibits no edema and no tenderness.  Lymphadenopathy:    She has no cervical adenopathy.  Neurological: She displays normal reflexes. No cranial nerve deficit. She exhibits normal muscle tone. Coordination normal.  Skin: No rash noted. No erythema.  Psychiatric: Her behavior is normal. Judgment and thought content normal.  Better      Lab Results  Component Value Date   WBC 6.6 04/04/2013   HGB 14.2 04/04/2013   HCT 42.3 04/04/2013   PLT 191.0 04/04/2013   GLUCOSE 89 04/04/2013   CHOL 181 02/16/2012   TRIG 113.0 02/16/2012   HDL 57.40 02/16/2012   LDLCALC 101* 02/16/2012   ALT 15 01/03/2013   AST 22 01/03/2013   NA 138 04/04/2013   K 4.4 04/04/2013   CL 105 04/04/2013   CREATININE 0.9 04/04/2013   BUN 15 04/04/2013   CO2 30 04/04/2013   TSH 2.47 04/04/2013        Assessment & Plan:

## 2013-09-12 NOTE — Assessment & Plan Note (Signed)
Stable Continue with current prescription therapy as reflected on the Med list. Risks associated with treatment noncompliance were discussed. Compliance was encouraged.

## 2013-09-12 NOTE — Assessment & Plan Note (Signed)
Off rx 

## 2013-09-12 NOTE — Assessment & Plan Note (Signed)
Better - off Wellbutrin 10.14

## 2013-09-12 NOTE — Assessment & Plan Note (Signed)
Risks associated with treatment noncompliance were discussed. Compliance was encouraged. Continue with current prescription therapy as reflected on the Med list.  

## 2013-09-13 ENCOUNTER — Ambulatory Visit: Payer: Medicare Other | Admitting: Internal Medicine

## 2013-10-02 ENCOUNTER — Encounter: Payer: Self-pay | Admitting: Gynecology

## 2013-10-02 ENCOUNTER — Ambulatory Visit (INDEPENDENT_AMBULATORY_CARE_PROVIDER_SITE_OTHER): Payer: Medicare Other | Admitting: Gynecology

## 2013-10-02 VITALS — BP 136/84 | Ht 62.0 in | Wt 133.0 lb

## 2013-10-02 DIAGNOSIS — N8111 Cystocele, midline: Secondary | ICD-10-CM

## 2013-10-02 DIAGNOSIS — M899 Disorder of bone, unspecified: Secondary | ICD-10-CM

## 2013-10-02 DIAGNOSIS — M8580 Other specified disorders of bone density and structure, unspecified site: Secondary | ICD-10-CM

## 2013-10-02 DIAGNOSIS — N9089 Other specified noninflammatory disorders of vulva and perineum: Secondary | ICD-10-CM

## 2013-10-02 DIAGNOSIS — N952 Postmenopausal atrophic vaginitis: Secondary | ICD-10-CM

## 2013-10-02 MED ORDER — CLOBETASOL PROPIONATE 0.05 % EX CREA: TOPICAL_CREAM | Freq: Two times a day (BID) | CUTANEOUS | Status: DC

## 2013-10-02 NOTE — Patient Instructions (Signed)
Lichen Sclerosus please look on line or Web Med

## 2013-10-02 NOTE — Progress Notes (Signed)
[name] [name] 09/22/1927 [id number]   History:    79 y.o.  Who presented to the office with her mother for GYN followup. Review of patient's records indicated she has osteopenia with her lowest T. Score at the right femoral neck with a value of -2.1. She was previously been followed by Dr. Nicholas Lose who has retired. Dr. Posey Rea is her PCP who has been treating her for hypertension and hyperlipidemia. Patient is currently on Aricept for memory issues. Patient has never been on hormone replacement therapy although in the past she had used vaginal estrogen currently not using. Patient denies any prior history of abnormal Pap smear. Patient's last mammogram was in 2011. Her last bone density study was in 2012. Patient declined colonoscopy.  Past medical history,surgical history, family history and social history were all reviewed and documented in the EPIC chart.  Gynecologic History No LMP recorded. Patient is postmenopausal. Contraception: post menopausal status Last Pap: 2012. Results were: normal Last mammogram: 2011. Results were: normal  Obstetric History OB History  Gravida Para Term Preterm AB SAB TAB Ectopic Multiple Living  2 2        2     # Outcome Date GA Lbr Len/2nd Weight Sex Delivery Anes PTL Lv  2 PAR           1 PAR                ROS: A ROS was performed and pertinent positives and negatives are included in the history.  GENERAL: No fevers or chills. HEENT: No change in vision, no earache, sore throat or sinus congestion. NECK: No pain or stiffness. CARDIOVASCULAR: No chest pain or pressure. No palpitations. PULMONARY: No shortness of breath, cough or wheeze. GASTROINTESTINAL: No abdominal pain, nausea, vomiting or diarrhea, melena or bright red blood per rectum. GENITOURINARY: No urinary frequency, urgency, hesitancy or dysuria. MUSCULOSKELETAL: No joint or muscle pain, no back pain, no recent trauma. DERMATOLOGIC: No rash, no itching, no lesions. ENDOCRINE: No polyuria,  polydipsia, no heat or cold intolerance. No recent change in weight. HEMATOLOGICAL: No anemia or easy bruising or bleeding. NEUROLOGIC: No headache, seizures, numbness, tingling or weakness. PSYCHIATRIC: No depression, no loss of interest in normal activity or change in sleep pattern.     Exam: chaperone present  BP 136/84  Ht 5\' 2"  (1.575 m)  Wt 133 lb (60.328 kg)  BMI 24.32 kg/m2  Body mass index is 24.32 kg/(m^2).  General appearance : Well developed well nourished female. No acute distress HEENT: Neck supple, trachea midline, no carotid bruits, no thyroidmegaly Lungs: Clear to auscultation, no rhonchi or wheezes, or rib retractions  Heart: Regular rate and rhythm, no murmurs or gallops Breast:Examined in sitting and supine position were symmetrical in appearance, no palpable masses or tenderness,  no skin retraction, no nipple inversion, no nipple discharge, no skin discoloration, no axillary or supraclavicular lymphadenopathy Abdomen: no palpable masses or tenderness, no rebound or guarding Extremities: no edema or skin discoloration or tenderness  Pelvic:  Bartholin, Urethra, Skene Glands: Within normal limits             Vagina: Physical Exam  Genitourinary:     first degree cystocele was noted  Cervix: No gross lesions or discharge  Uterus  axial, normal size, shape and consistency, non-tender and mobile  Adnexa  Without masses or tenderness  Anus and perineum  normal   Rectovaginal  normal sphincter tone without palpated masses or tenderness  Hemoccult PCP     Assessment/Plan:  79 y.o. female with apparent lichen sclerosis at the area of the fourchette. Patient will be given a trial of clobetasol 0.05% to apply twice a day for 2 weeks and to return to the office for followup. If his hearing has not cleared up we will biopsy it. Pap smear not indicated based on the Lungs. PCP drawn her lab work. All immunizations are up to date. Patient will need to schedule  a bone density study here in the office as well.  Note: This dictation was prepared with  Dragon/digital dictation along withSmart phrase technology. Any transcriptional errors that result from this process are unintentional.   Ok Edwards MD, 11:23 AM 10/02/2013

## 2013-10-04 ENCOUNTER — Telehealth: Payer: Self-pay | Admitting: *Deleted

## 2013-10-04 NOTE — Telephone Encounter (Signed)
Pt daughter called asking the location where the cream clobetasol 0.05% should be apply, I informed [name](daughter) area of the fourchette.

## 2013-10-17 ENCOUNTER — Ambulatory Visit: Payer: Medicare Other | Admitting: Gynecology

## 2013-10-19 ENCOUNTER — Other Ambulatory Visit: Payer: Self-pay | Admitting: Gynecology

## 2013-10-19 ENCOUNTER — Encounter: Payer: Self-pay | Admitting: Gynecology

## 2013-10-19 ENCOUNTER — Ambulatory Visit (INDEPENDENT_AMBULATORY_CARE_PROVIDER_SITE_OTHER): Payer: Medicare Other

## 2013-10-19 ENCOUNTER — Ambulatory Visit (INDEPENDENT_AMBULATORY_CARE_PROVIDER_SITE_OTHER): Payer: Medicare Other | Admitting: Gynecology

## 2013-10-19 VITALS — BP 136/84

## 2013-10-19 DIAGNOSIS — M8580 Other specified disorders of bone density and structure, unspecified site: Secondary | ICD-10-CM

## 2013-10-19 DIAGNOSIS — Z780 Asymptomatic menopausal state: Secondary | ICD-10-CM

## 2013-10-19 DIAGNOSIS — M899 Disorder of bone, unspecified: Secondary | ICD-10-CM

## 2013-10-19 DIAGNOSIS — N9089 Other specified noninflammatory disorders of vulva and perineum: Secondary | ICD-10-CM

## 2013-10-19 NOTE — Addendum Note (Signed)
Addended by: Bertram Savin A on: 10/19/2013 12:27 PM   Modules accepted: Orders

## 2013-10-19 NOTE — Progress Notes (Signed)
Patient is an 79 year old who was seen as a new patient to the practice 10/02/2013. She was previously been followed by another physician the community and is retired. 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 Patient had been on Actonel for 10 years several years ago. Her last bone density study was over 2 years ago and the lowest T score was at the right femoral neck with a value -2.1 at another facility. Patient returned today to discuss the results of the bone density study done here in our office as well as for followup on the vulvar leukoplakic area of the fourchette that was highly suspicious for lichen sclerosis and she was started on clobetasol 0.05% twice a day for 2 weeks and was asked to return to reexamine the area. The patient had not felt any prior discomfort.  #1 Bone density study done here in our office on 10/19/2013 demonstrated her lowest T. Score was at the right femoral neck with a value of -1.7 and normal FRAX Analysis. Her previous bone density study had been done at another facility with a different manufacture but the lowest T. Score there was -2.1 and a heart T. Score was -1.7 at the same region of interest so we can say that her bone density study is stable. Patient had lab work done in her primary physician's office and had a normal calcium level recently.  #2. The area fourchette demonstrated leukoplakic area still present and a small elevated white area that was not seen before was seen today. Patient was counseled and this area was biopsied with a keypunch biopsy instrument after the area was cleansed with Betadine solution and subdermally 1% lidocaine was infiltrated for local anesthesia. The tissue was submitted for histological evaluation. We will ask the patient to continue to apply the clobetasol twice a week until further notice. Her daughter was present and understood the instructions. Will notify the  patient as soon as the report is available from pathology.

## 2013-11-01 ENCOUNTER — Ambulatory Visit (INDEPENDENT_AMBULATORY_CARE_PROVIDER_SITE_OTHER): Payer: Medicare Other | Admitting: Gynecology

## 2013-11-01 ENCOUNTER — Encounter: Payer: Self-pay | Admitting: Gynecology

## 2013-11-01 ENCOUNTER — Other Ambulatory Visit: Payer: Self-pay | Admitting: Internal Medicine

## 2013-11-01 VITALS — BP 134/80

## 2013-11-01 DIAGNOSIS — L940 Localized scleroderma [morphea]: Secondary | ICD-10-CM

## 2013-11-01 DIAGNOSIS — L900 Lichen sclerosus et atrophicus: Secondary | ICD-10-CM | POA: Insufficient documentation

## 2013-11-01 MED ORDER — LIDOCAINE 5 % EX OINT: 1.0000 "application " | TOPICAL_OINTMENT | CUTANEOUS | Status: DC | PRN

## 2013-11-01 NOTE — Progress Notes (Signed)
79 year old patient was seen in the office on November 20 where a biopsy of a leukoplakic area at the area near the fourchette demonstrated lichen sclerosus and she was started on clobetasol 0.05% twice a week for 2 weeks and then followup and this is the reason she is here today.  Exam: Area of the fourchette not completely healed slightly tender but not erythematous no drainage. The area was debrided with hydrogen peroxide.  Assessment/plan: Lichen sclerosus. Patient will be instructed to irrigate the area with the shower head for at least 10 minutes once a day. I've called in a prescription for lidocaine gel to apply 3 times a day when necessary for the next 2-3 weeks since she is very sore and irritated. After the 3 weeks and she'll then begin applying the clobetasol to the external genitalia twice a week. She will return back in 6 months for followup.

## 2013-11-17 ENCOUNTER — Other Ambulatory Visit: Payer: Self-pay | Admitting: Internal Medicine

## 2013-12-12 ENCOUNTER — Other Ambulatory Visit: Payer: Self-pay | Admitting: Internal Medicine

## 2013-12-12 NOTE — Telephone Encounter (Signed)
Refill done.  

## 2014-02-01 ENCOUNTER — Other Ambulatory Visit: Payer: Self-pay | Admitting: Internal Medicine

## 2014-02-01 NOTE — Telephone Encounter (Signed)
Refill request for mirtazapine Last filled by MD on - 04/17/2013 #30 x5 Last Appt: 09/12/2013 Next Appt: 03/13/2014 Please advise refill?

## 2014-03-06 ENCOUNTER — Ambulatory Visit (INDEPENDENT_AMBULATORY_CARE_PROVIDER_SITE_OTHER): Payer: Medicare Other | Admitting: Internal Medicine

## 2014-03-06 ENCOUNTER — Ambulatory Visit (INDEPENDENT_AMBULATORY_CARE_PROVIDER_SITE_OTHER)
Admission: RE | Admit: 2014-03-06 | Discharge: 2014-03-06 | Disposition: A | Discharge status: Home / Self Care | Payer: Medicare Other | Source: Ambulatory Visit | Attending: Internal Medicine | Admitting: Internal Medicine

## 2014-03-06 ENCOUNTER — Encounter: Payer: Self-pay | Admitting: Internal Medicine

## 2014-03-06 VITALS — BP 126/80 | HR 75 | Temp 97.4°F | Wt 129.5 lb

## 2014-03-06 DIAGNOSIS — J069 Acute upper respiratory infection, unspecified: Secondary | ICD-10-CM

## 2014-03-06 DIAGNOSIS — I10 Essential (primary) hypertension: Secondary | ICD-10-CM

## 2014-03-06 DIAGNOSIS — J189 Pneumonia, unspecified organism: Secondary | ICD-10-CM | POA: Insufficient documentation

## 2014-03-06 MED ORDER — LEVOFLOXACIN 500 MG PO TABS: 500.0000 mg | ORAL_TABLET | Freq: Every day | ORAL | Status: DC

## 2014-03-06 MED ORDER — PROMETHAZINE-CODEINE 6.25-10 MG/5ML PO SYRP: 5.0000 mL | ORAL_SOLUTION | ORAL | Status: DC | PRN

## 2014-03-06 NOTE — Assessment & Plan Note (Signed)
Will repeat a CXR in 1 mo Levaquin  500 mg/d x 10 d RTC 3-4 wks

## 2014-03-06 NOTE — Progress Notes (Signed)
Patient ID: [name], female   DOB: 1927-05-29, 80 y.o.   MRN: [id number]   Subjective:   The patient is here for a wellness exam. The patient has been doing well overall without major physical or psychological issues going on lately.    Cough Associated symptoms include rhinorrhea and wheezing. Pertinent negatives include no chest pain, chills, headaches or rash.  Back Pain This is a chronic problem. The current episode started more than 1 year ago. The problem occurs daily. The problem has been waxing and waning since onset. The pain is present in the lumbar spine. The quality of the pain is described as aching. The pain is at a severity of 4/10. The pain is moderate. The symptoms are aggravated by bending and twisting. Pertinent negatives include no abdominal pain, chest pain, headaches, numbness or weakness. The treatment provided mild relief.  URI  This is a new problem. The current episode started 1 to 4 weeks ago. The problem has been gradually worsening. There has been no fever. Associated symptoms include coughing, rhinorrhea, sinus pain and wheezing. Pertinent negatives include no abdominal pain, chest pain, congestion, headaches, nausea or rash.  Off and on - not taking any meds for LBP   The patient is here to follow up on chronic dementia, grief/depression, anxiety,CAD symptoms controlled with medicines   Wt Readings from Last 3 Encounters:  03/06/14 129 lb 8 oz (58.741 kg)  10/02/13 133 lb (60.328 kg)  09/12/13 134 lb (60.782 kg)   BP Readings from Last 3 Encounters:  03/06/14 126/80  11/01/13 134/80  10/19/13 136/84       Review of Systems  Constitutional: Negative for chills, activity change, appetite change, fatigue and unexpected weight change.  HENT: Positive for rhinorrhea. Negative for congestion, mouth sores and sinus pressure.   Eyes: Negative for visual disturbance.  Respiratory: Positive for cough and wheezing. Negative for chest tightness.    Cardiovascular: Negative for chest pain.  Gastrointestinal: Negative for nausea and abdominal pain.  Genitourinary: Negative for frequency, difficulty urinating and vaginal pain.  Musculoskeletal: Positive for back pain. Negative for gait problem.  Skin: Negative for pallor and rash.  Neurological: Negative for dizziness, tremors, weakness, numbness and headaches.  Psychiatric/Behavioral: Positive for decreased concentration. Negative for suicidal ideas, confusion and sleep disturbance. The patient is not nervous/anxious.        Objective:   Physical Exam  Constitutional: She appears well-developed. No distress.  HENT:  Head: Normocephalic.  Right Ear: External ear normal.  Left Ear: External ear normal.  Nose: Nose normal.  Mouth/Throat: Oropharynx is clear and moist.  Eyes: Conjunctivae are normal. Pupils are equal, round, and reactive to light. Right eye exhibits no discharge. Left eye exhibits no discharge.  Neck: Normal range of motion. Neck supple. No JVD present. No tracheal deviation present. No thyromegaly present.  Cardiovascular: Normal rate, regular rhythm and normal heart sounds.   Pulmonary/Chest: No stridor. No respiratory distress. She has no wheezes.  R lung rhonchi  Abdominal: Soft. Bowel sounds are normal. She exhibits no distension and no mass. There is no tenderness. There is no rebound and no guarding.  Musculoskeletal: She exhibits no edema and no tenderness.  Lymphadenopathy:    She has no cervical adenopathy.  Neurological: She displays normal reflexes. No cranial nerve deficit. She exhibits normal muscle tone. Coordination normal.  Skin: No rash noted. No erythema.  Psychiatric: Her behavior is normal. Thought content normal.        Lab Results  Component Value Date   WBC 6.6 04/04/2013   HGB 14.2 04/04/2013   HCT 42.3 04/04/2013   PLT 191.0 04/04/2013   GLUCOSE 97 09/12/2013   CHOL 181 02/16/2012   TRIG 113.0 02/16/2012   HDL 57.40 02/16/2012   LDLCALC  101* 02/16/2012   ALT 15 01/03/2013   AST 22 01/03/2013   NA 141 09/12/2013   K 4.1 09/12/2013   CL 105 09/12/2013   CREATININE 0.8 09/12/2013   BUN 18 09/12/2013   CO2 27 09/12/2013   TSH 2.80 09/12/2013        Assessment & Plan:

## 2014-03-06 NOTE — Assessment & Plan Note (Signed)
CXR Levaquin 500 mg qd x 10 d Prom-cod syr

## 2014-03-06 NOTE — Assessment & Plan Note (Signed)
Continue with current prescription therapy as reflected on the Med list.  

## 2014-03-06 NOTE — Progress Notes (Signed)
Pre visit review using our clinic review tool, if applicable. No additional management support is needed unless otherwise documented below in the visit note. 

## 2014-03-13 ENCOUNTER — Ambulatory Visit: Payer: Medicare Other | Admitting: Internal Medicine

## 2014-04-04 ENCOUNTER — Encounter: Payer: Self-pay | Admitting: Internal Medicine

## 2014-04-04 ENCOUNTER — Ambulatory Visit (INDEPENDENT_AMBULATORY_CARE_PROVIDER_SITE_OTHER)
Admission: RE | Admit: 2014-04-04 | Discharge: 2014-04-04 | Disposition: A | Discharge status: Home / Self Care | Payer: Medicare Other | Source: Ambulatory Visit | Attending: Internal Medicine | Admitting: Internal Medicine

## 2014-04-04 ENCOUNTER — Ambulatory Visit (INDEPENDENT_AMBULATORY_CARE_PROVIDER_SITE_OTHER): Payer: Medicare Other | Admitting: Internal Medicine

## 2014-04-04 VITALS — BP 160/76 | HR 76 | Temp 97.5°F | Resp 16 | Wt 131.0 lb

## 2014-04-04 DIAGNOSIS — J069 Acute upper respiratory infection, unspecified: Secondary | ICD-10-CM

## 2014-04-04 DIAGNOSIS — I2510 Atherosclerotic heart disease of native coronary artery without angina pectoris: Secondary | ICD-10-CM

## 2014-04-04 DIAGNOSIS — J189 Pneumonia, unspecified organism: Secondary | ICD-10-CM

## 2014-04-04 DIAGNOSIS — F3289 Other specified depressive episodes: Secondary | ICD-10-CM

## 2014-04-04 DIAGNOSIS — F329 Major depressive disorder, single episode, unspecified: Secondary | ICD-10-CM

## 2014-04-04 DIAGNOSIS — F068 Other specified mental disorders due to known physiological condition: Secondary | ICD-10-CM

## 2014-04-04 DIAGNOSIS — I10 Essential (primary) hypertension: Secondary | ICD-10-CM

## 2014-04-04 NOTE — Assessment & Plan Note (Signed)
Repeat CXR

## 2014-04-04 NOTE — Progress Notes (Deleted)
Pre visit review using our clinic review tool, if applicable. No additional management support is needed unless otherwise documented below in the visit note. 

## 2014-04-04 NOTE — Progress Notes (Addendum)
   Subjective:    Patient ID: Eileen KnackRuby M Harrower, female    DOB: 10-Oct-1927, 80 y.o.   MRN: [id number]  HPI  F/u PNA, dementia   Review of Systems  Constitutional: Negative for chills, activity change, appetite change, fatigue and unexpected weight change.  HENT: Negative for congestion, mouth sores and sinus pressure.   Eyes: Negative for visual disturbance.  Respiratory: Negative for cough and chest tightness.   Gastrointestinal: Negative for nausea and abdominal pain.  Genitourinary: Negative for frequency, difficulty urinating and vaginal pain.  Musculoskeletal: Negative for back pain and gait problem.  Skin: Negative for pallor and rash.  Neurological: Negative for dizziness, tremors, weakness, numbness and headaches.  Psychiatric/Behavioral: Positive for decreased concentration. Negative for confusion and sleep disturbance. The patient is not nervous/anxious.        Objective:   Physical Exam  Constitutional: She appears well-developed. No distress.  HENT:  Head: Normocephalic.  Right Ear: External ear normal.  Left Ear: External ear normal.  Nose: Nose normal.  Mouth/Throat: Oropharynx is clear and moist.  Eyes: Conjunctivae are normal. Pupils are equal, round, and reactive to light. Right eye exhibits no discharge. Left eye exhibits no discharge.  Neck: Normal range of motion. Neck supple. No JVD present. No tracheal deviation present. No thyromegaly present.  Cardiovascular: Normal rate, regular rhythm and normal heart sounds.   Pulmonary/Chest: No stridor. No respiratory distress. She has no wheezes.  Abdominal: Soft. Bowel sounds are normal. She exhibits no distension and no mass. There is no tenderness. There is no rebound and no guarding.  Musculoskeletal: She exhibits no edema and no tenderness.  Lymphadenopathy:    She has no cervical adenopathy.  Neurological: She displays normal reflexes. No cranial nerve deficit. She exhibits normal muscle tone. Coordination  normal.  Skin: No rash noted. No erythema.  Psychiatric: She has a normal mood and affect. Her behavior is normal.          Assessment & Plan:

## 2014-04-04 NOTE — Assessment & Plan Note (Signed)
Recovered  

## 2014-04-08 ENCOUNTER — Encounter: Payer: Self-pay | Admitting: Internal Medicine

## 2014-04-08 NOTE — Assessment & Plan Note (Signed)
Continue with current prescription therapy as reflected on the Med list.  

## 2014-05-09 ENCOUNTER — Encounter: Payer: Self-pay | Admitting: Gynecology

## 2014-05-09 ENCOUNTER — Ambulatory Visit (INDEPENDENT_AMBULATORY_CARE_PROVIDER_SITE_OTHER): Payer: Medicare Other | Admitting: Gynecology

## 2014-05-09 VITALS — BP 138/80

## 2014-05-09 DIAGNOSIS — L940 Localized scleroderma [morphea]: Secondary | ICD-10-CM

## 2014-05-09 DIAGNOSIS — N904 Leukoplakia of vulva: Secondary | ICD-10-CM

## 2014-05-09 NOTE — Progress Notes (Signed)
   The patient presented to the office today for her six-month followup after having been prescribed clobetasol 0.05% which she has been applied twice a week as a result of her lichen sclerosis. The patient has some mild form of dementia her daughter was present and daughter states that she is not sure she's been applying the medication or not. Patient was otherwise asymptomatic.  Exam: Bartholin's urethra Skene's is within normal limits Fourchette completely healed External genitalia with no evidence of lichen sclerosus mild form still present the perineum perirectal region no lesions seen  Assessment/plan: Patient responding well to clobetasol 0.05% twice a week as a result of that are lichen sclerosus. She will continue to do so indefinitely twice a week. She scheduled to see me again in a year for annual exam or when necessary.

## 2014-05-11 ENCOUNTER — Ambulatory Visit (INDEPENDENT_AMBULATORY_CARE_PROVIDER_SITE_OTHER): Payer: Medicare Other | Admitting: Internal Medicine

## 2014-05-11 ENCOUNTER — Ambulatory Visit (INDEPENDENT_AMBULATORY_CARE_PROVIDER_SITE_OTHER): Payer: Medicare Other

## 2014-05-11 ENCOUNTER — Encounter: Payer: Self-pay | Admitting: Internal Medicine

## 2014-05-11 VITALS — BP 140/82 | HR 72 | Temp 98.0°F | Resp 16 | Wt 128.0 lb

## 2014-05-11 DIAGNOSIS — R209 Unspecified disturbances of skin sensation: Secondary | ICD-10-CM

## 2014-05-11 DIAGNOSIS — G8929 Other chronic pain: Secondary | ICD-10-CM

## 2014-05-11 DIAGNOSIS — M545 Low back pain, unspecified: Secondary | ICD-10-CM

## 2014-05-11 DIAGNOSIS — R202 Paresthesia of skin: Secondary | ICD-10-CM

## 2014-05-11 DIAGNOSIS — I10 Essential (primary) hypertension: Secondary | ICD-10-CM

## 2014-05-11 DIAGNOSIS — F068 Other specified mental disorders due to known physiological condition: Secondary | ICD-10-CM

## 2014-05-11 LAB — URINALYSIS, ROUTINE W REFLEX MICROSCOPIC
NITRITE: NEGATIVE
PH: 6.5 (ref 5.0–8.0)
Specific Gravity, Urine: 1.025 (ref 1.000–1.030)
Urine Glucose: NEGATIVE
Urobilinogen, UA: 0.2 (ref 0.0–1.0)

## 2014-05-11 LAB — CBC WITH DIFFERENTIAL/PLATELET
BASOS PCT: 0.6 % (ref 0.0–3.0)
Basophils Absolute: 0 10*3/uL (ref 0.0–0.1)
EOS PCT: 2.4 % (ref 0.0–5.0)
Eosinophils Absolute: 0.2 10*3/uL (ref 0.0–0.7)
HEMATOCRIT: 44.1 % (ref 36.0–46.0)
Hemoglobin: 14.7 g/dL (ref 12.0–15.0)
LYMPHS ABS: 2.1 10*3/uL (ref 0.7–4.0)
Lymphocytes Relative: 27.8 % (ref 12.0–46.0)
MCHC: 33.3 g/dL (ref 30.0–36.0)
MCV: 87.7 fl (ref 78.0–100.0)
MONOS PCT: 11.1 % (ref 3.0–12.0)
Monocytes Absolute: 0.8 10*3/uL (ref 0.1–1.0)
NEUTROS ABS: 4.4 10*3/uL (ref 1.4–7.7)
Neutrophils Relative %: 58.1 % (ref 43.0–77.0)
Platelets: 239 10*3/uL (ref 150.0–400.0)
RBC: 5.03 Mil/uL (ref 3.87–5.11)
RDW: 15.6 % — ABNORMAL HIGH (ref 11.5–15.5)
WBC: 7.6 10*3/uL (ref 4.0–10.5)

## 2014-05-11 MED ORDER — IBUPROFEN 400 MG PO TABS: 400.0000 mg | ORAL_TABLET | Freq: Three times a day (TID) | ORAL | Status: DC | PRN

## 2014-05-11 NOTE — Assessment & Plan Note (Signed)
Labs

## 2014-05-11 NOTE — Assessment & Plan Note (Signed)
Continue with current prescription therapy as reflected on the Med list.  

## 2014-05-11 NOTE — Assessment & Plan Note (Signed)
Continue with current prescription therapy as reflected on the Med list. Labs  

## 2014-05-11 NOTE — Progress Notes (Deleted)
Pre visit review using our clinic review tool, if applicable. No additional management support is needed unless otherwise documented below in the visit note. 

## 2014-05-14 LAB — BASIC METABOLIC PANEL
BUN: 16 mg/dL (ref 6–23)
CHLORIDE: 102 meq/L (ref 96–112)
CO2: 22 meq/L (ref 19–32)
CREATININE: 1.1 mg/dL (ref 0.4–1.2)
Calcium: 9.8 mg/dL (ref 8.4–10.5)
GFR: 49.68 mL/min — ABNORMAL LOW (ref >60.00)
GLUCOSE: 114 mg/dL — AB (ref 70–99)
Potassium: 4.3 mEq/L (ref 3.5–5.1)
Sodium: 138 mEq/L (ref 135–145)

## 2014-05-14 LAB — TSH: TSH: 5.21 u[IU]/mL — ABNORMAL HIGH (ref 0.35–4.50)

## 2014-05-14 LAB — HEPATIC FUNCTION PANEL
ALT: 12 U/L (ref 0–35)
AST: 21 U/L (ref 0–37)
Albumin: 4.6 g/dL (ref 3.5–5.2)
Alkaline Phosphatase: 63 U/L (ref 39–117)
Bilirubin, Direct: 0.1 mg/dL (ref 0.0–0.3)
TOTAL PROTEIN: 8 g/dL (ref 6.0–8.3)
Total Bilirubin: 0.3 mg/dL (ref 0.2–1.2)

## 2014-05-14 LAB — SEDIMENTATION RATE: Sed Rate: 21 mm/hr (ref 0–22)

## 2014-05-14 LAB — VITAMIN B12: Vitamin B-12: 371 pg/mL (ref 211–911)

## 2014-05-15 ENCOUNTER — Other Ambulatory Visit: Payer: Self-pay | Admitting: Internal Medicine

## 2014-05-15 MED ORDER — CIPROFLOXACIN HCL 250 MG PO TABS: 250.0000 mg | ORAL_TABLET | Freq: Two times a day (BID) | ORAL | Status: DC

## 2014-05-15 NOTE — Progress Notes (Signed)
   Subjective:      Back Pain This is a chronic problem. The current episode started more than 1 year ago. The problem occurs daily. The problem has been waxing and waning since onset. The pain is present in the lumbar spine. The quality of the pain is described as aching. The pain is at a severity of 4/10. The pain is moderate. The symptoms are aggravated by bending and twisting. Pertinent negatives include no numbness or weakness. The treatment provided mild relief.  Off and on - not taking any meds for LBP   The patient is here to follow up on chronic dementia - worse, grief/depression, anxiety,CAD symptoms controlled with medicines   Wt Readings from Last 3 Encounters:  05/11/14 128 lb (58.06 kg)  04/04/14 131 lb (59.421 kg)  03/06/14 129 lb 8 oz (58.741 kg)   BP Readings from Last 3 Encounters:  05/11/14 140/82  05/09/14 138/80  04/04/14 160/76       Review of Systems  Constitutional: Negative for activity change, appetite change, fatigue and unexpected weight change.  HENT: Negative for mouth sores and sinus pressure.   Eyes: Negative for visual disturbance.  Respiratory: Negative for chest tightness.   Genitourinary: Negative for frequency, difficulty urinating and vaginal pain.  Musculoskeletal: Positive for back pain. Negative for gait problem.  Skin: Negative for pallor.  Neurological: Negative for dizziness, tremors, weakness and numbness.  Psychiatric/Behavioral: Positive for decreased concentration. Negative for suicidal ideas, confusion, sleep disturbance and agitation. The patient is not nervous/anxious.        Objective:   Physical Exam  Constitutional: She appears well-developed. No distress.  HENT:  Head: Normocephalic.  Right Ear: External ear normal.  Left Ear: External ear normal.  Nose: Nose normal.  Mouth/Throat: Oropharynx is clear and moist.  Eyes: Conjunctivae are normal. Pupils are equal, round, and reactive to light. Right eye exhibits no  discharge. Left eye exhibits no discharge.  Neck: Normal range of motion. Neck supple. No JVD present. No tracheal deviation present. No thyromegaly present.  Cardiovascular: Normal rate, regular rhythm and normal heart sounds.   Pulmonary/Chest: No stridor. No respiratory distress. She has no wheezes.  Abdominal: Soft. Bowel sounds are normal. She exhibits no distension and no mass. There is no tenderness. There is no rebound and no guarding.  Musculoskeletal: She exhibits no edema and no tenderness.  Lymphadenopathy:    She has no cervical adenopathy.  Neurological: She displays normal reflexes. No cranial nerve deficit. She exhibits normal muscle tone. Coordination normal.  Skin: No rash noted. No erythema.  Psychiatric: Her behavior is normal. Thought content normal.        Lab Results  Component Value Date   WBC 7.6 05/11/2014   HGB 14.7 05/11/2014   HCT 44.1 05/11/2014   PLT 239.0 05/11/2014   GLUCOSE 114* 05/11/2014   CHOL 181 02/16/2012   TRIG 113.0 02/16/2012   HDL 57.40 02/16/2012   LDLCALC 101* 02/16/2012   ALT 12 05/11/2014   AST 21 05/11/2014   NA 138 05/11/2014   K 4.3 05/11/2014   CL 102 05/11/2014   CREATININE 1.1 05/11/2014   BUN 16 05/11/2014   CO2 22 05/11/2014   TSH 5.21* 05/11/2014        Assessment & Plan:

## 2014-05-26 ENCOUNTER — Encounter (HOSPITAL_COMMUNITY): Payer: Self-pay | Admitting: Emergency Medicine

## 2014-05-26 ENCOUNTER — Inpatient Hospital Stay (HOSPITAL_COMMUNITY)
Admission: EM | Admit: 2014-05-26 | Discharge: 2014-05-29 | DRG: 247 | Disposition: A | Discharge status: Home / Self Care | Payer: Medicare Other | Attending: Cardiovascular Disease | Admitting: Cardiovascular Disease

## 2014-05-26 ENCOUNTER — Emergency Department (HOSPITAL_COMMUNITY): Payer: Medicare Other

## 2014-05-26 DIAGNOSIS — F329 Major depressive disorder, single episode, unspecified: Secondary | ICD-10-CM | POA: Diagnosis present

## 2014-05-26 DIAGNOSIS — E785 Hyperlipidemia, unspecified: Secondary | ICD-10-CM | POA: Diagnosis present

## 2014-05-26 DIAGNOSIS — I2510 Atherosclerotic heart disease of native coronary artery without angina pectoris: Secondary | ICD-10-CM | POA: Diagnosis present

## 2014-05-26 DIAGNOSIS — M545 Low back pain, unspecified: Secondary | ICD-10-CM

## 2014-05-26 DIAGNOSIS — R0602 Shortness of breath: Secondary | ICD-10-CM

## 2014-05-26 DIAGNOSIS — Z955 Presence of coronary angioplasty implant and graft: Secondary | ICD-10-CM

## 2014-05-26 DIAGNOSIS — I2589 Other forms of chronic ischemic heart disease: Secondary | ICD-10-CM | POA: Diagnosis present

## 2014-05-26 DIAGNOSIS — K5730 Diverticulosis of large intestine without perforation or abscess without bleeding: Secondary | ICD-10-CM | POA: Diagnosis present

## 2014-05-26 DIAGNOSIS — I25118 Atherosclerotic heart disease of native coronary artery with other forms of angina pectoris: Secondary | ICD-10-CM

## 2014-05-26 DIAGNOSIS — F3289 Other specified depressive episodes: Secondary | ICD-10-CM | POA: Diagnosis present

## 2014-05-26 DIAGNOSIS — G309 Alzheimer's disease, unspecified: Secondary | ICD-10-CM

## 2014-05-26 DIAGNOSIS — M810 Age-related osteoporosis without current pathological fracture: Secondary | ICD-10-CM | POA: Diagnosis present

## 2014-05-26 DIAGNOSIS — Z9861 Coronary angioplasty status: Secondary | ICD-10-CM

## 2014-05-26 DIAGNOSIS — Z888 Allergy status to other drugs, medicaments and biological substances status: Secondary | ICD-10-CM

## 2014-05-26 DIAGNOSIS — F0390 Unspecified dementia without behavioral disturbance: Secondary | ICD-10-CM | POA: Diagnosis present

## 2014-05-26 DIAGNOSIS — R740 Nonspecific elevation of levels of transaminase and lactic acid dehydrogenase [LDH]: Secondary | ICD-10-CM

## 2014-05-26 DIAGNOSIS — F0280 Dementia in other diseases classified elsewhere without behavioral disturbance: Secondary | ICD-10-CM | POA: Diagnosis present

## 2014-05-26 DIAGNOSIS — I209 Angina pectoris, unspecified: Secondary | ICD-10-CM

## 2014-05-26 DIAGNOSIS — R7401 Elevation of levels of liver transaminase levels: Secondary | ICD-10-CM

## 2014-05-26 DIAGNOSIS — I10 Essential (primary) hypertension: Secondary | ICD-10-CM | POA: Diagnosis present

## 2014-05-26 DIAGNOSIS — I059 Rheumatic mitral valve disease, unspecified: Secondary | ICD-10-CM

## 2014-05-26 DIAGNOSIS — G8929 Other chronic pain: Secondary | ICD-10-CM

## 2014-05-26 DIAGNOSIS — I214 Non-ST elevation (NSTEMI) myocardial infarction: Secondary | ICD-10-CM | POA: Diagnosis present

## 2014-05-26 HISTORY — DX: Unspecified dementia, unspecified severity, without behavioral disturbance, psychotic disturbance, mood disturbance, and anxiety: F03.90

## 2014-05-26 HISTORY — DX: Ischemic cardiomyopathy: I25.5

## 2014-05-26 LAB — BASIC METABOLIC PANEL
BUN: 21 mg/dL (ref 6–23)
CO2: 20 mEq/L (ref 19–32)
Calcium: 9.6 mg/dL (ref 8.4–10.5)
Chloride: 105 mEq/L (ref 96–112)
Creatinine, Ser: 0.98 mg/dL (ref 0.50–1.10)
GFR calc non Af Amer: 52 mL/min — ABNORMAL LOW (ref >90)
GFR, EST AFRICAN AMERICAN: 60 mL/min — AB (ref >90)
Glucose, Bld: 147 mg/dL — ABNORMAL HIGH (ref 70–99)
POTASSIUM: 4.6 meq/L (ref 3.7–5.3)
SODIUM: 145 meq/L (ref 137–147)

## 2014-05-26 LAB — CBC
HCT: 44.3 % (ref 36.0–46.0)
HEMOGLOBIN: 14.3 g/dL (ref 12.0–15.0)
MCH: 29.5 pg (ref 26.0–34.0)
MCHC: 32.3 g/dL (ref 30.0–36.0)
MCV: 91.5 fL (ref 78.0–100.0)
Platelets: 193 10*3/uL (ref 150–400)
RBC: 4.84 MIL/uL (ref 3.87–5.11)
RDW: 14.7 % (ref 11.5–15.5)
WBC: 8.8 10*3/uL (ref 4.0–10.5)

## 2014-05-26 LAB — I-STAT TROPONIN, ED: TROPONIN I, POC: 0.17 ng/mL — AB (ref 0.00–0.08)

## 2014-05-26 LAB — URINE MICROSCOPIC-ADD ON

## 2014-05-26 LAB — HEPATIC FUNCTION PANEL
ALT: 12 U/L (ref 0–35)
AST: 21 U/L (ref 0–37)
Albumin: 4 g/dL (ref 3.5–5.2)
Alkaline Phosphatase: 66 U/L (ref 39–117)
BILIRUBIN TOTAL: 0.4 mg/dL (ref 0.3–1.2)
Total Protein: 7.7 g/dL (ref 6.0–8.3)

## 2014-05-26 LAB — PROTIME-INR
INR: 1 (ref 0.00–1.49)
Prothrombin Time: 13.2 seconds (ref 11.6–15.2)

## 2014-05-26 LAB — URINALYSIS, ROUTINE W REFLEX MICROSCOPIC
Bilirubin Urine: NEGATIVE
GLUCOSE, UA: NEGATIVE mg/dL
KETONES UR: 15 mg/dL — AB
Nitrite: NEGATIVE
Protein, ur: NEGATIVE mg/dL
Specific Gravity, Urine: 1.023 (ref 1.005–1.030)
Urobilinogen, UA: 0.2 mg/dL (ref 0.0–1.0)
pH: 5.5 (ref 5.0–8.0)

## 2014-05-26 LAB — TSH: TSH: 1.89 u[IU]/mL (ref 0.350–4.500)

## 2014-05-26 LAB — TROPONIN I
TROPONIN I: 11.81 ng/mL — AB (ref <0.30)
Troponin I: 12.64 ng/mL (ref <0.30)

## 2014-05-26 LAB — MRSA PCR SCREENING: MRSA BY PCR: NEGATIVE

## 2014-05-26 LAB — APTT: aPTT: 27 seconds (ref 24–37)

## 2014-05-26 MED ORDER — NEBIVOLOL HCL 10 MG PO TABS
10.0000 mg | ORAL_TABLET | Freq: Every day | ORAL | Status: DC
  Administered 2014-05-26 – 2014-05-28 (×3): 10 mg via ORAL
  Filled 2014-05-26 (×4): qty 1

## 2014-05-26 MED ORDER — LISINOPRIL 5 MG PO TABS
5.0000 mg | ORAL_TABLET | Freq: Every day | ORAL | Status: DC
  Administered 2014-05-26 – 2014-05-29 (×4): 5 mg via ORAL
  Filled 2014-05-26 (×4): qty 1

## 2014-05-26 MED ORDER — ASPIRIN EC 81 MG PO TBEC
81.0000 mg | DELAYED_RELEASE_TABLET | Freq: Every day | ORAL | Status: DC
  Administered 2014-05-27 – 2014-05-29 (×3): 81 mg via ORAL
  Filled 2014-05-26 (×3): qty 1

## 2014-05-26 MED ORDER — NITROGLYCERIN 2 % TD OINT
0.5000 [in_us] | TOPICAL_OINTMENT | Freq: Four times a day (QID) | TRANSDERMAL | Status: DC
  Administered 2014-05-26 – 2014-05-28 (×5): 0.5 [in_us] via TOPICAL
  Filled 2014-05-26: qty 30

## 2014-05-26 MED ORDER — LORATADINE 10 MG PO TABS
10.0000 mg | ORAL_TABLET | Freq: Every day | ORAL | Status: DC | PRN
  Filled 2014-05-26: qty 1

## 2014-05-26 MED ORDER — HEPARIN (PORCINE) IN NACL 100-0.45 UNIT/ML-% IJ SOLN
700.0000 [IU]/h | INTRAMUSCULAR | Status: DC
  Administered 2014-05-26: 650 [IU]/h via INTRAVENOUS
  Administered 2014-05-28: 700 [IU]/h via INTRAVENOUS
  Filled 2014-05-26 (×4): qty 250

## 2014-05-26 MED ORDER — ATORVASTATIN CALCIUM 40 MG PO TABS
40.0000 mg | ORAL_TABLET | Freq: Every day | ORAL | Status: DC
  Administered 2014-05-26 – 2014-05-28 (×3): 40 mg via ORAL
  Filled 2014-05-26 (×4): qty 1

## 2014-05-26 MED ORDER — ACETAMINOPHEN 325 MG PO TABS: 650.0000 mg | ORAL_TABLET | ORAL | Status: DC | PRN

## 2014-05-26 MED ORDER — ONDANSETRON HCL 4 MG/2ML IJ SOLN: 4.0000 mg | Freq: Four times a day (QID) | INTRAMUSCULAR | Status: DC | PRN

## 2014-05-26 MED ORDER — MIRTAZAPINE 15 MG PO TABS
15.0000 mg | ORAL_TABLET | Freq: Every day | ORAL | Status: DC
Start: 2014-05-26 — End: 2014-05-29
  Administered 2014-05-26 – 2014-05-28 (×3): 15 mg via ORAL
  Filled 2014-05-26 (×4): qty 1

## 2014-05-26 MED ORDER — AMLODIPINE BESYLATE 2.5 MG PO TABS
2.5000 mg | ORAL_TABLET | Freq: Every day | ORAL | Status: DC
  Administered 2014-05-26 – 2014-05-28 (×3): 2.5 mg via ORAL
  Filled 2014-05-26 (×4): qty 1

## 2014-05-26 MED ORDER — ASPIRIN 81 MG PO CHEW
324.0000 mg | CHEWABLE_TABLET | Freq: Once | ORAL | Status: AC
  Filled 2014-05-26: qty 4

## 2014-05-26 MED ORDER — HEPARIN BOLUS VIA INFUSION
3000.0000 [IU] | Freq: Once | INTRAVENOUS | Status: AC
  Administered 2014-05-26: 3000 [IU] via INTRAVENOUS
  Filled 2014-05-26: qty 3000

## 2014-05-26 MED ORDER — SODIUM CHLORIDE 0.9 % IV SOLN: 20.0000 mL | INTRAVENOUS | Status: DC

## 2014-05-26 MED ORDER — ASPIRIN 81 MG PO CHEW
324.0000 mg | CHEWABLE_TABLET | Freq: Once | ORAL | Status: AC
  Administered 2014-05-26: 324 mg via ORAL

## 2014-05-26 MED ORDER — MEMANTINE HCL ER 28 MG PO CP24
1.0000 | ORAL_CAPSULE | Freq: Every day | ORAL | Status: DC
  Administered 2014-05-27 – 2014-05-29 (×3): 28 mg via ORAL
  Filled 2014-05-26 (×3): qty 28

## 2014-05-26 MED ORDER — DONEPEZIL HCL 10 MG PO TABS
10.0000 mg | ORAL_TABLET | Freq: Every day | ORAL | Status: DC
  Administered 2014-05-26 – 2014-05-28 (×3): 10 mg via ORAL
  Filled 2014-05-26 (×4): qty 1

## 2014-05-26 MED ORDER — NITROGLYCERIN 0.4 MG SL SUBL: 0.4000 mg | SUBLINGUAL_TABLET | SUBLINGUAL | Status: DC | PRN

## 2014-05-26 MED ORDER — CIPROFLOXACIN HCL 250 MG PO TABS: 250.0000 mg | ORAL_TABLET | Freq: Two times a day (BID) | ORAL | Status: DC

## 2014-05-26 NOTE — ED Notes (Signed)
Denies n/v. Does not feel short of breath now.

## 2014-05-26 NOTE — ED Provider Notes (Signed)
CSN: [id number]     Arrival date & time 05/26/14  0704 History   First MD Initiated Contact with Patient 05/26/14 [phone]     Chief Complaint  Patient presents with  . Chest Pain  . Shortness of Breath     (Consider location/radiation/quality/duration/timing/severity/associated sxs/prior Treatment) HPI [age] year old female with a history of coronary artery disease who awoke this morning with upper anterior chest pain that radiated up to her throat. She describes it in pressure and nature and severe at 10 out of 10 when she woke. She has associated dyspnea with this. She initially called her daughter and stated that she had not been able to call when it first started because it was so severe. Her daughter called EMS and patient was transported here to the emergency department. Currently during my evaluation she states that the pain is better and her symptoms have resolved. She was placed in a monitor with oxygen and this appeared to be when her symptoms improved. She had a stent placed in the 90s. She does not know when her last cardiac evaluation was done. Past Medical History  Diagnosis Date  . CORONARY ARTERY DISEASE   . HYPERTENSION   . HYPERLIPIDEMIA   . DEPRESSION   . HERPES ZOSTER   . VERTIGO   . DIVERTICULOSIS, COLON   . OSTEOPOROSIS   . ANXIETY   . DEMENTIA   . Myocardial infarction, anterior wall    Past Surgical History  Procedure Laterality Date  . Ptca  12/09/2000    successful; of the proximal left anterior descending with reduction of 99% narrowing to 30% with improvement of TIMI grade 1 to TIMI grade 3 flow  . Tonsillectomy and adenoidectomy  1943   Family History  Problem Relation Age of Onset  . Hypertension Other   . Heart disease Other   . Coronary artery disease Other    History  Substance Use Topics  . Smoking status: Never Smoker   . Smokeless tobacco: Never Used  . Alcohol Use: No   OB History   Grav Para Term Preterm Abortions TAB SAB Ect Mult Living    2 2        2      Review of Systems  All other systems reviewed and are negative.     Allergies  Simvastatin  Home Medications   Prior to Admission medications   Medication Sig Start Date End Date Taking? Authorizing Provider  amLODipine (NORVASC) 5 MG tablet Take 2.5 mg by mouth daily.   Yes Historical Provider, MD  clobetasol cream (TEMOVATE) 0.05 % Apply 1 application topically daily.   Yes Historical Provider, MD  donepezil (ARICEPT) 10 MG tablet Take 10 mg by mouth at bedtime.   Yes Historical Provider, MD  ibuprofen (ADVIL,MOTRIN) 400 MG tablet Take 1 tablet (400 mg total) by mouth every 8 (eight) hours as needed for moderate pain. 05/11/14  Yes Aleksei Plotnikov V, MD  lisinopril (PRINIVIL,ZESTRIL) 5 MG tablet Take 5 mg by mouth daily.   Yes Historical Provider, MD  loratadine (CLARITIN) 10 MG tablet Take 10 mg by mouth daily as needed for allergies.    Yes Historical Provider, MD  Memantine HCl ER (NAMENDA XR) 28 MG CP24 Take 28 mg by mouth daily. 04/04/13  Yes Aleksei Plotnikov V, MD  mirtazapine (REMERON) 15 MG tablet Take 15 mg by mouth at bedtime.   Yes Historical Provider, MD  nebivolol (BYSTOLIC) 10 MG tablet Take 10 mg by mouth daily.   Yes Historical  Provider, MD  ciprofloxacin (CIPRO) 250 MG tablet Take 1 tablet (250 mg total) by mouth 2 (two) times daily. 05/15/14   Aleksei Plotnikov V, MD   BP 114/63  Pulse 74  Temp(Src) 96.6 F (35.9 C) (Rectal)  Resp 16  Ht 5\' 3"  (1.6 m)  Wt 130 lb (58.968 kg)  BMI 23.03 kg/m2  SpO2 100% Physical Exam  Nursing note and vitals reviewed. Constitutional: She is oriented to person, place, and time. She appears well-developed and well-nourished.  HENT:  Head: Normocephalic and atraumatic.  Right Ear: External ear normal.  Left Ear: External ear normal.  Eyes: Conjunctivae and EOM are normal. Pupils are equal, round, and reactive to light.  Neck: Normal range of motion. Neck supple.  Cardiovascular: Normal rate, regular  rhythm, normal heart sounds and intact distal pulses.   Pulmonary/Chest: Effort normal and breath sounds normal. No respiratory distress.  Abdominal: Soft. Bowel sounds are normal.  Musculoskeletal: Normal range of motion. She exhibits no edema and no tenderness.  Neurological: She is alert and oriented to person, place, and time. She has normal reflexes.  Skin: Skin is warm and dry.  Psychiatric: She has a normal mood and affect. Her behavior is normal. Judgment and thought content normal.    ED Course  Procedures (including critical care time) Labs Review Labs Reviewed  BASIC METABOLIC PANEL - Abnormal; Notable for the following:    Glucose, Bld 147 (*)    GFR calc non Af Amer 52 (*)    GFR calc Af Amer 60 (*)    All other components within normal limits  I-STAT TROPOININ, ED - Abnormal; Notable for the following:    Troponin i, poc 0.17 (*)    All other components within normal limits  CBC  APTT  PROTIME-INR  URINALYSIS, ROUTINE W REFLEX MICROSCOPIC  HEPATIC FUNCTION PANEL    Imaging Review No results found.   EKG Interpretation   Date/Time:  Saturday May 26 2014 07:11:03 EDT Ventricular Rate:  80 PR Interval:  162 QRS Duration: 82 QT Interval:  306 QTC Calculation: 352 R Axis:   86 Text Interpretation:  Normal sinus rhythm Poor data quality Poor R wave  progression Since last tracing of 11 July 2011 rate has increased  Abnormal ECG Confirmed by RAY MD, Duwayne HeckANIELLE [phone]([id number]) on 05/26/2014 7:46:43 AM      MDM   Final diagnoses:  NSTEMI (non-ST elevated myocardial infarction)    Patient continues asymptomatic here.  EKG does not reveal acute ischemia but initial troponin is elevated.  Patient given aspirin and cardiology consulted.   8:31 AM Discussed with Dr.Koneswaran and they will be in to evaluate.   9:56 AM Patient remains pain free.  Awaiting cardiology consult.    Hilario Quarryanielle S Ray, MD 05/26/14 [phone]

## 2014-05-26 NOTE — ED Notes (Signed)
Cardiology at bedside.

## 2014-05-26 NOTE — H&P (Signed)
80 yr old woman with 2 hours of chest pain and dyspnea prior to presentation, admitted with NSTEMI. Initial troponin 0.17. Currently denies chest pain, palpitations, and shortness of breath. Has had intermittent episodes of "flushing". ECG with nonspecific abnormality. Currently on heparin. HR in high 50 bpm range, and will thus not start nevibolol (taking at home). Prior simvastatin intolerance, would consider trial of low-dose Lipitor. Plan for coronary angiography on 6/29 or sooner if symptoms warrant it. Pt is currently hemodynamically stable.

## 2014-05-26 NOTE — Progress Notes (Signed)
ANTICOAGULATION CONSULT NOTE - Initial Consult  Pharmacy Consult for heparin Indication: chest pain/ACS  Allergies  Allergen Reactions  . Simvastatin     REACTION: memory problem, myalgia    Patient Measurements: Height: 5\' 3"  (160 cm) Weight: 130 lb (58.968 kg) IBW/kg (Calculated) : 52.4 Heparin Dosing Weight: 59kg  Vital Signs: Temp: 96.6 F (35.9 C) (06/27 0753) Temp src: Rectal (06/27 0753) BP: 133/73 mmHg (06/27 1424) Pulse Rate: 68 (06/27 1300)  Labs:  Recent Labs  05/26/14 0730  HGB 14.3  HCT 44.3  PLT 193  APTT 27  LABPROT 13.2  INR 1.00  CREATININE 0.98    Estimated Creatinine Clearance: 35.3 ml/min (by C-G formula based on Cr of 0.98).   Medical History: Past Medical History  Diagnosis Date  . CORONARY ARTERY DISEASE     a. 11/2000 Cath/PTCA: LM 20d, LAD 99p (PTCA), LCX 70p, RCA dom, 5093m, EF 30 dist ant AK;  b. 2008 Myoview: EF 75%, small ant apical scar, mild peri-infarct ischemia.  [name]. HYPERTENSION   . HYPERLIPIDEMIA   . DEPRESSION   . HERPES ZOSTER   . VERTIGO   . DIVERTICULOSIS, COLON   . OSTEOPOROSIS   . ANXIETY   . DEMENTIA   . History of Ischemic Cardiomyopathy     a. 11/2000 LV gram: EF 30%, dist ant AK;  b. 2008 MV EF 75%.   Assessment: 80 year old female with prior history of coronary artery disease status post LAD angioplasty in 2002, who presents with chest pain and non-STEMI. Pharmacy consulted to start IV heparin. Not on anticoagulants pta. Baseline labs appear normal. Plan for cath on Monday if not needed sooner.  Goal of Therapy:  Heparin level 0.3-0.7 units/ml Monitor platelets by anticoagulation protocol: Yes   Plan:  Give 3000 units bolus x 1 Start heparin infusion at 650 units/hr Check anti-Xa level in 8 hours and daily while on heparin Continue to monitor H&H and platelets  Sheppard CoilFrank Wilson PharmD., BCPS Clinical Pharmacist Pager [phone] 05/26/2014 2:50 PM

## 2014-05-26 NOTE — Progress Notes (Signed)
  Echocardiogram 2D Echocardiogram has been performed.  Susannah Carbin FRANCES 05/26/2014, 5:41 PM

## 2014-05-26 NOTE — H&P (Signed)
Patient ID: [name] MRN: [id number], DOB/AGE: 78-Feb-1928   Admit date: 05/26/2014   Primary Physician: Sonda PrimesAlex Plotnikov, MD Primary Cardiologist: previously T. Wall, MD   Pt. Profile:  80 year old female with prior history of coronary artery disease status post LAD angioplasty in 2002, who presents with chest pain and non-STEMI.  Problem List  Past Medical History  Diagnosis Date  . CORONARY ARTERY DISEASE     a. 11/2000 Cath/PTCA: LM 20d, LAD 99p (PTCA), LCX 70p, RCA dom, 4680m, EF 30 dist ant AK;  b. 2008 Myoview: EF 75%, small ant apical scar, mild peri-infarct ischemia.  [name]. HYPERTENSION   . HYPERLIPIDEMIA   . DEPRESSION   . HERPES ZOSTER   . VERTIGO   . DIVERTICULOSIS, COLON   . OSTEOPOROSIS   . ANXIETY   . DEMENTIA   . History of Ischemic Cardiomyopathy     a. 11/2000 LV gram: EF 30%, dist ant AK;  b. 2008 MV EF 75%.    Past Surgical History  Procedure Laterality Date  . Ptca  12/09/2000    successful; of the proximal left anterior descending with reduction of 99% narrowing to 30% with improvement of TIMI grade 1 to TIMI grade 3 flow  . Tonsillectomy and adenoidectomy  1943     Allergies  Allergies  Allergen Reactions  . Simvastatin     REACTION: memory problem, myalgia    HPI  80 year old female with prior history of coronary artery disease status post anterior MI in 2002. At that time she underwent PTCA of the LAD. Although LV function was depressed in the setting of her MI, subsequent left ventricular evaluation during stress testing in 2008, showed normal LV function as well as no evidence of ischemia. [place] and is very independent. She does not usually experience chest pain, dyspnea, or limitations in activities. Yesterday, she reports her daughter that she was somewhat short of breath after walking in her yard. This morning, she awoke suddenly with pressure and tightness in her chest radiating up to her neck. This was  associated with dyspnea and marked diaphoresis. She was sure that she was going to die. After about an hour and 30 minutes of persistent symptoms, she called her daughter who recommended that she call EMS, however patient insisted that daughter come over and drive her to the ED. Her daughter did so and here, ECG is nonacute. Initial troponin is elevated 0.17. Total duration of pain was approximately 2 hours and resolved after receiving aspirin. She is currently pain-free.  Home Medications  Prior to Admission medications   Medication Sig Start Date End Date Taking? Authorizing Provider  amLODipine (NORVASC) 5 MG tablet Take 2.5 mg by mouth daily.   Yes Historical Provider, MD  clobetasol cream (TEMOVATE) 0.05 % Apply 1 application topically daily.   Yes Historical Provider, MD  donepezil (ARICEPT) 10 MG tablet Take 10 mg by mouth at bedtime.   Yes Historical Provider, MD  ibuprofen (ADVIL,MOTRIN) 400 MG tablet Take 1 tablet (400 mg total) by mouth every 8 (eight) hours as needed for moderate pain. 05/11/14  Yes Aleksei Plotnikov V, MD  lisinopril (PRINIVIL,ZESTRIL) 5 MG tablet Take 5 mg by mouth daily.   Yes Historical Provider, MD  loratadine (CLARITIN) 10 MG tablet Take 10 mg by mouth daily as needed for allergies.    Yes Historical Provider, MD  Memantine HCl ER (NAMENDA XR) 28 MG CP24 Take 28 mg by mouth daily. 04/04/13  Yes  Aleksei Plotnikov V, MD  mirtazapine (REMERON) 15 MG tablet Take 15 mg by mouth at bedtime.   Yes Historical Provider, MD  nebivolol (BYSTOLIC) 10 MG tablet Take 10 mg by mouth daily.   Yes Historical Provider, MD  ciprofloxacin (CIPRO) 250 MG tablet Take 1 tablet (250 mg total) by mouth 2 (two) times daily. 05/15/14   Tresa GarterAleksei Plotnikov V, MD    Family History  Family History  Problem Relation Age of Onset  . Hypertension Other   . Heart disease Other   . Coronary artery disease Other     Social History  History   Social History  . Marital Status: Widowed     Spouse Name: N/A    Number of Children: N/A  . Years of Education: N/A   Occupational History  .      Retired   Social History Main Topics  . Smoking status: Never Smoker   . Smokeless tobacco: Never Used  . Alcohol Use: No  . Drug Use: No  . Sexual Activity: No   Other Topics Concern  . Not on file   Social History Narrative   Regular exercise-yes     Review of Systems General:  No chills, fever, night sweats or weight changes.  Cardiovascular:  Positive chest pain with dyspnea and diaphoresis this morning. No edema, orthopnea, palpitations, paroxysmal nocturnal dyspnea. Dermatological: No rash, lesions/masses Respiratory: No cough, dyspnea Urologic: No hematuria, dysuria Abdominal:   No nausea, vomiting, diarrhea, bright red blood per rectum, melena, or hematemesis Neurologic:  No visual changes, wkns, changes in mental status. All other systems reviewed and are otherwise negative except as noted above.  Physical Exam  Blood pressure 118/63, pulse 61, temperature 96.6 F (35.9 C), temperature source Rectal, resp. rate 20, height 5\' 3"  (1.6 m), weight 130 lb (58.968 kg), SpO2 100.00%.  General: Pleasant, NAD Psych: Normal affect. Neuro: Alert and oriented X 3. Moves all extremities spontaneously. HEENT: Normal  Neck: Supple without bruits or JVD. Lungs:  Resp regular and unlabored, CTA. Heart: RRR no s3, s4, or murmurs. Abdomen: Soft, non-tender, non-distended, BS + x 4.  Extremities: No clubbing, cyanosis or edema. DP/PT/Radials 2+ and equal bilaterally.  Labs  Troponin [facility](Point of Care Test)  Recent Labs  05/26/14 0744  TROPIPOC 0.17*    Lab Results  Component Value Date   WBC 8.8 05/26/2014   HGB 14.3 05/26/2014   HCT 44.3 05/26/2014   MCV 91.5 05/26/2014   PLT 193 05/26/2014    Recent Labs Lab 05/26/14 0730  NA 145  K 4.6  CL 105  CO2 20  BUN 21  CREATININE 0.98  CALCIUM 9.6  PROT 7.7  BILITOT 0.4  ALKPHOS 66  ALT 12  AST 21  GLUCOSE 147*     Radiology/Studies  Dg Chest Port 1 View  05/26/2014   CLINICAL DATA:  chest pain  EXAM: PORTABLE CHEST - 1 VIEW  COMPARISON:  04/04/2014  FINDINGS: Heart size upper limits normal. Bibasilar mild interstitial edema or infiltrates. No definite effusion.  Visualized skeletal structures are unremarkable.  IMPRESSION: 1. Mild bibasilar interstitial edema or infiltrates.   Electronically Signed   By: Oley Balmaniel  Hassell M.D.   On: 05/26/2014 08:59   ECG  Rsr, 80, poor r prog, no acute st/t changes.  ASSESSMENT AND PLAN  1. Acute non-ST segment elevation myocardial infarction/coronary artery disease: Patient presents with a two-hour history of substernal chest discomfort associated with dyspnea and diaphoresis. This has since resolved. ECG is  nonacute. Troponin is elevated 0.17. Plan to admit and cycle cardiac markers. Add heparin and statin (prior intolerance to simvastatin), and continue home dose of bb.  Plan cath on Monday or sooner if recurrent/recalcitrant chest pain.  The patient understands that risks include but are not limited to stroke (1 in 1000), death (1 in 1000), kidney failure [usually temporary] (1 in 500), bleeding (1 in 200), allergic reaction [possibly serious] (1 in 200), and agrees to proceed.  Eventual cardiac rehab.  2.  HTN:  Stable.  Cont home meds.  3.  HL:  Check lipids/lft's.  H/o intolerance to simva.  Will try atorva.  Signed, Nicolasa Ducking, NP 05/26/2014, 11:00 AM

## 2014-05-26 NOTE — ED Notes (Signed)
Pt brought in by daughter who reported that pt was having a hard time breathing and c/o chest pain. Pt reports that pain is in her throat and does not go anywhere else. Pt reports the shortness of breath has passed. Pt talking in complete sentences without difficulty. Skin cool and dry.

## 2014-05-26 NOTE — ED Notes (Signed)
Reported Attempted  

## 2014-05-26 NOTE — Progress Notes (Signed)
CRITICAL VALUE ALERT  Critical value received: trop  Date of notification:  05/26/2014  Time of notification:  1618  Critical value read back:Yes.    Nurse who received alert:  Tammy SoursAngela barlow  MD notified (1st page):  Thayer Ohmhris berge,np  Time of first page:  1620  MD notified (2nd page):  Time of second page:  Responding MD:  Ward Givenshris berge  Time MD responded:  [phone]

## 2014-05-26 NOTE — ED Notes (Signed)
Placed on 2L ?

## 2014-05-26 NOTE — ED Notes (Signed)
MD at bedside. 

## 2014-05-27 DIAGNOSIS — M545 Low back pain, unspecified: Secondary | ICD-10-CM

## 2014-05-27 DIAGNOSIS — R7401 Elevation of levels of liver transaminase levels: Secondary | ICD-10-CM

## 2014-05-27 DIAGNOSIS — G8929 Other chronic pain: Secondary | ICD-10-CM

## 2014-05-27 DIAGNOSIS — R740 Nonspecific elevation of levels of transaminase and lactic acid dehydrogenase [LDH]: Secondary | ICD-10-CM

## 2014-05-27 LAB — COMPREHENSIVE METABOLIC PANEL
ALT: 18 U/L (ref 0–35)
AST: 72 U/L — AB (ref 0–37)
Albumin: 3.7 g/dL (ref 3.5–5.2)
Alkaline Phosphatase: 63 U/L (ref 39–117)
BUN: 21 mg/dL (ref 6–23)
CALCIUM: 9 mg/dL (ref 8.4–10.5)
CO2: 23 mEq/L (ref 19–32)
Chloride: 101 mEq/L (ref 96–112)
Creatinine, Ser: 0.87 mg/dL (ref 0.50–1.10)
GFR calc non Af Amer: 59 mL/min — ABNORMAL LOW (ref >90)
GFR, EST AFRICAN AMERICAN: 69 mL/min — AB (ref >90)
Glucose, Bld: 123 mg/dL — ABNORMAL HIGH (ref 70–99)
Potassium: 4.6 mEq/L (ref 3.7–5.3)
SODIUM: 139 meq/L (ref 137–147)
TOTAL PROTEIN: 7.2 g/dL (ref 6.0–8.3)
Total Bilirubin: 0.4 mg/dL (ref 0.3–1.2)

## 2014-05-27 LAB — LIPID PANEL
Cholesterol: 203 mg/dL — ABNORMAL HIGH (ref 0–200)
HDL: 48 mg/dL (ref >39)
LDL CALC: 112 mg/dL — AB (ref 0–99)
Total CHOL/HDL Ratio: 4.2 RATIO
Triglycerides: 213 mg/dL — ABNORMAL HIGH (ref <150)
VLDL: 43 mg/dL — ABNORMAL HIGH (ref 0–40)

## 2014-05-27 LAB — TROPONIN I: Troponin I: 10.62 ng/mL (ref <0.30)

## 2014-05-27 LAB — HEPARIN LEVEL (UNFRACTIONATED)
Heparin Unfractionated: 0.3 IU/mL (ref 0.30–0.70)
Heparin Unfractionated: 0.42 IU/mL (ref 0.30–0.70)

## 2014-05-27 NOTE — Progress Notes (Signed)
ANTICOAGULATION CONSULT NOTE   Pharmacy Consult for heparin Indication: chest pain/ACS  Allergies  Allergen Reactions  . Simvastatin     REACTION: memory problem, myalgia    Patient Measurements: Height: 5\' 3"  (160 cm) Weight: 129 lb 13.6 oz (58.9 kg) IBW/kg (Calculated) : 52.4 Heparin Dosing Weight: 59kg  Vital Signs: Temp: 97.6 F (36.4 C) (06/28 0600) Temp src: Oral (06/28 0600) BP: 96/53 mmHg (06/28 0600) Pulse Rate: 63 (06/28 0600)  Labs:  Recent Labs  05/26/14 0730 05/26/14 1522 05/26/14 2050 05/26/14 2355 05/27/14 0235 05/27/14 0237  HGB 14.3  --   --   --   --   --   HCT 44.3  --   --   --   --   --   PLT 193  --   --   --   --   --   APTT 27  --   --   --   --   --   LABPROT 13.2  --   --   --   --   --   INR 1.00  --   --   --   --   --   HEPARINUNFRC  --   --   --  0.42 0.30  --   CREATININE 0.98  --   --   --  0.87  --   TROPONINI  --  12.64* 11.81*  --   --  10.62*    Estimated Creatinine Clearance: 39.8 ml/min (by C-G formula based on Cr of 0.87).   Medical History: Past Medical History  Diagnosis Date  . CORONARY ARTERY DISEASE     a. 11/2000 Cath/PTCA: LM 20d, LAD 99p (PTCA), LCX 70p, RCA dom, 2749m, EF 30 dist ant AK;  b. 2008 Myoview: EF 75%, small ant apical scar, mild peri-infarct ischemia.  [name]. HYPERTENSION   . HYPERLIPIDEMIA   . DEPRESSION   . HERPES ZOSTER   . VERTIGO   . DIVERTICULOSIS, COLON   . OSTEOPOROSIS   . ANXIETY   . DEMENTIA   . History of Ischemic Cardiomyopathy     a. 11/2000 LV gram: EF 30%, dist ant AK;  b. 2008 MV EF 75%.   Assessment: 80 year old female with prior history of coronary artery disease status post LAD angioplasty in 2002, who presents with chest pain and non-STEMI. Pharmacy consulted to start IV heparin. Not on anticoagulants pta.   Heparin at goal this am on 650 units/hr but trending down, will adjust rate. No bleeding issues noted. CBC missed this am, will reorder.   Goal of Therapy:  Heparin level  0.3-0.7 units/ml Monitor platelets by anticoagulation protocol: Yes   Plan:  Increase heparin infusion to 700 units/hr Check anti-Xa level and CBC daily while on heparin Continue to monitor H&H and platelets  Sheppard CoilFrank Deontra Pereyra PharmD., BCPS Clinical Pharmacist Pager [phone] 05/27/2014 9:31 AM

## 2014-05-27 NOTE — Progress Notes (Signed)
ANTICOAGULATION CONSULT NOTE Pharmacy Consult for heparin Indication: chest pain/ACS  Allergies  Allergen Reactions  . Simvastatin     REACTION: memory problem, myalgia    Patient Measurements: Height: 5\' 3"  (160 cm) Weight: 130 lb (58.968 kg) IBW/kg (Calculated) : 52.4 Heparin Dosing Weight: 59kg  Vital Signs: Temp: 97.7 F (36.5 C) (06/28 0000) Temp src: Oral (06/28 0000) BP: 99/46 mmHg (06/28 0000) Pulse Rate: 64 (06/28 0000)  Labs:  Recent Labs  05/26/14 0730 05/26/14 1522 05/26/14 2050 05/26/14 2355  HGB 14.3  --   --   --   HCT 44.3  --   --   --   PLT 193  --   --   --   APTT 27  --   --   --   LABPROT 13.2  --   --   --   INR 1.00  --   --   --   HEPARINUNFRC  --   --   --  0.42  CREATININE 0.98  --   --   --   TROPONINI  --  12.64* 11.81*  --     Estimated Creatinine Clearance: 35.3 ml/min (by C-G formula based on Cr of 0.98).  Assessment: 80 year old female with chest pain/NSTEMI for heparin  Goal of Therapy:  Heparin level 0.3-0.7 units/ml Monitor platelets by anticoagulation protocol: Yes   Plan:  Continue Heparin at current rate for now Follow-up am labs.   Geannie RisenGreg Abbott, PharmD, BCPS  05/27/2014 12:48 AM

## 2014-05-27 NOTE — Progress Notes (Signed)
SUBJECTIVE: Pt in very pleasant mood, denying chest pain, palpitations, shortness of breath, orthopnea, and PND. Troponins peaked at 12.64, most recently 10.62.     Intake/Output Summary (Last 24 hours) at 05/27/14 0809 Last data filed at 05/27/14 0600  Gross per 24 hour  Intake 508.15 ml  Output    301 ml  Net 207.15 ml    Current Facility-Administered Medications  Medication Dose Route Frequency Deangleo Passage Last Rate Last Dose  . 0.9 %  sodium chloride infusion  20 mL Intravenous Continuous Hilario Quarryanielle S Ray, MD 10 mL/hr at 05/26/14 2000 10 mL at 05/26/14 2000  . acetaminophen (TYLENOL) tablet 650 mg  650 mg Oral Q4H PRN Ok Anishristopher R Berge, NP      . amLODipine (NORVASC) tablet 2.5 mg  2.5 mg Oral Daily Ok Anishristopher R Berge, NP   2.5 mg at 05/26/14 1801  . aspirin EC tablet 81 mg  81 mg Oral Daily Ok Anishristopher R Berge, NP      . atorvastatin (LIPITOR) tablet 40 mg  40 mg Oral q1800 Ok Anishristopher R Berge, NP   40 mg at 05/26/14 1946  . donepezil (ARICEPT) tablet 10 mg  10 mg Oral QHS Ok Anishristopher R Berge, NP   10 mg at 05/26/14 1946  . heparin ADULT infusion 100 units/mL (25000 units/250 mL)  650 Units/hr Intravenous Continuous Severiano GilbertFrank Rhea Wilson, RPH 6.5 mL/hr at 05/26/14 1600 650 Units/hr at 05/26/14 1600  . lisinopril (PRINIVIL,ZESTRIL) tablet 5 mg  5 mg Oral Daily Ok Anishristopher R Berge, NP   5 mg at 05/26/14 1802  . loratadine (CLARITIN) tablet 10 mg  10 mg Oral Daily PRN Ok Anishristopher R Berge, NP      . Memantine HCl ER CP24 28 mg  1 capsule Oral Daily Ok Anishristopher R Berge, NP      . mirtazapine (REMERON) tablet 15 mg  15 mg Oral QHS Ok Anishristopher R Berge, NP   15 mg at 05/26/14 1946  . nebivolol (BYSTOLIC) tablet 10 mg  10 mg Oral Daily Ok Anishristopher R Berge, NP   10 mg at 05/26/14 1802  . nitroGLYCERIN (NITROGLYN) 2 % ointment 0.5 inch  0.5 inch Topical 4 times per day Ok Anishristopher R Berge, NP   0.5 inch at 05/26/14 1802  . nitroGLYCERIN (NITROSTAT) SL tablet 0.4 mg  0.4 mg Sublingual Q5  min PRN Hilario Quarryanielle S Ray, MD      . ondansetron [facility](ZOFRAN) injection 4 mg  4 mg Intravenous Q6H PRN Ok Anishristopher R Berge, NP        Filed Vitals:   05/27/14 0400 05/27/14 0427 05/27/14 0500 05/27/14 0600  BP: 87/36  85/34 96/53  Pulse: 62  59 63  Temp:    97.6 F (36.4 C)  TempSrc:    Oral  Resp:      Height:      Weight:  129 lb 13.6 oz (58.9 kg)    SpO2: 96%  99% 95%    PHYSICAL EXAM General: NAD Neck: No JVD, no thyromegaly.  Lungs: Faint crackles at right base with normal respiratory effort. CV: Nondisplaced PMI.  Regular rate and rhythm, normal S1/S2, no S3/S4, no murmur.  No pretibial edema.  No carotid bruit.  Normal pedal pulses.  Abdomen: Soft, nontender, no hepatosplenomegaly, no distention.  Neurologic: Alert and oriented x 3.  Psych: Normal affect. Extremities: No clubbing or cyanosis.   TELEMETRY: Reviewed telemetry pt in sinus rhythm.  LABS: Basic Metabolic Panel:  Recent Labs  16/08/9605/27/15 0730 05/27/14 0235  NA  145 139  K 4.6 4.6  CL 105 101  CO2 20 23  GLUCOSE 147* 123*  BUN 21 21  CREATININE 0.98 0.87  CALCIUM 9.6 9.0   Liver Function Tests:  Recent Labs  05/26/14 0730 05/27/14 0235  AST 21 72*  ALT 12 18  ALKPHOS 66 63  BILITOT 0.4 0.4  PROT 7.7 7.2  ALBUMIN 4.0 3.7   No results found for this basename: LIPASE, AMYLASE,  in the last 72 hours CBC:  Recent Labs  05/26/14 0730  WBC 8.8  HGB 14.3  HCT 44.3  MCV 91.5  PLT 193   Cardiac Enzymes:  Recent Labs  05/26/14 1522 05/26/14 2050 05/27/14 0237  TROPONINI 12.64* 11.81* 10.62*   BNP: No components found with this basename: POCBNP,  D-Dimer: No results found for this basename: DDIMER,  in the last 72 hours Hemoglobin A1C: No results found for this basename: HGBA1C,  in the last 72 hours Fasting Lipid Panel:  Recent Labs  05/27/14 0235  CHOL 203*  HDL 48  LDLCALC 112*  TRIG 213*  CHOLHDL 4.2   Thyroid Function Tests:  Recent Labs  05/26/14 1522  TSH 1.890    Anemia Panel: No results found for this basename: VITAMINB12, FOLATE, FERRITIN, TIBC, IRON, RETICCTPCT,  in the last 72 hours  RADIOLOGY: Dg Chest Port 1 View  05/26/2014   CLINICAL DATA:  chest pain  EXAM: PORTABLE CHEST - 1 VIEW  COMPARISON:  04/04/2014  FINDINGS: Heart size upper limits normal. Bibasilar mild interstitial edema or infiltrates. No definite effusion.  Visualized skeletal structures are unremarkable.  IMPRESSION: 1. Mild bibasilar interstitial edema or infiltrates.   Electronically Signed   By: Oley Balmaniel  Hassell M.D.   On: 05/26/2014 08:59      ASSESSMENT AND PLAN: 1. Acute non-ST segment elevation myocardial infarction/coronary artery disease: Patient initially presented with a two-hour history of substernal chest discomfort associated with dyspnea and diaphoresis. This has since resolved. ECG is nonacute. Troponins peaked at 12.64, most recently 10.62. Continue ASA, heparin and atorvastatin (prior intolerance to simvastatin), along with Bystolic and lisinopril. Plan cath on Monday or sooner if recurrent/recalcitrant chest pain. The patient understands that risks include but are not limited to stroke (1 in 1000), death (1 in 1000), kidney failure [usually temporary] (1 in 500), bleeding (1 in 200), allergic reaction [possibly serious] (1 in 200), and agrees to proceed. Eventual cardiac rehab.   2. HTN: Stable. Cont home meds.   3. Hyperlipidemia: AST 21-->72. For now, continue atorvastatin 40 mg daily with continued transaminase monitoring. LDL 112.   Prentice DockerSuresh Koneswaran, M.D., F.A.C.C.

## 2014-05-27 NOTE — Progress Notes (Signed)
Utilization Review Completed.Kelley, Eileen T6/28/2015  

## 2014-05-28 ENCOUNTER — Encounter (HOSPITAL_COMMUNITY)
Admission: EM | Disposition: A | Discharge status: Home / Self Care | Payer: Medicare Other | Source: Home / Self Care | Attending: Cardiovascular Disease

## 2014-05-28 ENCOUNTER — Encounter (HOSPITAL_COMMUNITY): Payer: Self-pay | Admitting: Interventional Cardiology

## 2014-05-28 DIAGNOSIS — F0390 Unspecified dementia without behavioral disturbance: Secondary | ICD-10-CM

## 2014-05-28 DIAGNOSIS — I214 Non-ST elevation (NSTEMI) myocardial infarction: Secondary | ICD-10-CM

## 2014-05-28 DIAGNOSIS — I2510 Atherosclerotic heart disease of native coronary artery without angina pectoris: Secondary | ICD-10-CM

## 2014-05-28 HISTORY — PX: LEFT HEART CATHETERIZATION WITH CORONARY ANGIOGRAM: SHX5451

## 2014-05-28 HISTORY — PX: PERCUTANEOUS CORONARY STENT INTERVENTION (PCI-S): SHX5485

## 2014-05-28 LAB — CBC
HCT: 39.8 % (ref 36.0–46.0)
Hemoglobin: 12.5 g/dL (ref 12.0–15.0)
MCH: 28.6 pg (ref 26.0–34.0)
MCHC: 31.4 g/dL (ref 30.0–36.0)
MCV: 91.1 fL (ref 78.0–100.0)
Platelets: 163 10*3/uL (ref 150–400)
RBC: 4.37 MIL/uL (ref 3.87–5.11)
RDW: 14.8 % (ref 11.5–15.5)
WBC: 10.3 10*3/uL (ref 4.0–10.5)

## 2014-05-28 LAB — HEPARIN LEVEL (UNFRACTIONATED): Heparin Unfractionated: 0.43 IU/mL (ref 0.30–0.70)

## 2014-05-28 LAB — POCT ACTIVATED CLOTTING TIME: Activated Clotting Time: 568 seconds

## 2014-05-28 SURGERY — LEFT HEART CATHETERIZATION WITH CORONARY ANGIOGRAM
Anesthesia: LOCAL

## 2014-05-28 MED ORDER — VERAPAMIL HCL 2.5 MG/ML IV SOLN
INTRAVENOUS | Status: AC
  Filled 2014-05-28: qty 2

## 2014-05-28 MED ORDER — ASPIRIN 81 MG PO CHEW: 81.0000 mg | CHEWABLE_TABLET | ORAL | Status: DC

## 2014-05-28 MED ORDER — SODIUM CHLORIDE 0.9 % IV SOLN: 1.0000 mL/kg/h | INTRAVENOUS | Status: DC

## 2014-05-28 MED ORDER — SODIUM CHLORIDE 0.9 % IV SOLN: INTRAVENOUS | Status: AC

## 2014-05-28 MED ORDER — TICAGRELOR 90 MG PO TABS
90.0000 mg | ORAL_TABLET | Freq: Two times a day (BID) | ORAL | Status: DC
  Administered 2014-05-28 – 2014-05-29 (×2): 90 mg via ORAL
  Filled 2014-05-28 (×3): qty 1

## 2014-05-28 MED ORDER — ACETAMINOPHEN 325 MG PO TABS: 650.0000 mg | ORAL_TABLET | ORAL | Status: DC | PRN

## 2014-05-28 MED ORDER — ONDANSETRON HCL 4 MG/2ML IJ SOLN: 4.0000 mg | Freq: Four times a day (QID) | INTRAMUSCULAR | Status: DC | PRN

## 2014-05-28 MED ORDER — SODIUM CHLORIDE 0.9 % IV SOLN: 250.0000 mL | INTRAVENOUS | Status: DC | PRN

## 2014-05-28 MED ORDER — HEPARIN SODIUM (PORCINE) 1000 UNIT/ML IJ SOLN
INTRAMUSCULAR | Status: AC
  Filled 2014-05-28: qty 1

## 2014-05-28 MED ORDER — FENTANYL CITRATE 0.05 MG/ML IJ SOLN
INTRAMUSCULAR | Status: AC
  Filled 2014-05-28: qty 2

## 2014-05-28 MED ORDER — BIVALIRUDIN 250 MG IV SOLR
INTRAVENOUS | Status: AC
  Filled 2014-05-28: qty 250

## 2014-05-28 MED ORDER — SODIUM CHLORIDE 0.9 % IJ SOLN: 3.0000 mL | Freq: Two times a day (BID) | INTRAMUSCULAR | Status: DC

## 2014-05-28 MED ORDER — HEPARIN (PORCINE) IN NACL 2-0.9 UNIT/ML-% IJ SOLN
INTRAMUSCULAR | Status: AC
  Filled 2014-05-28: qty 1000

## 2014-05-28 MED ORDER — TICAGRELOR 90 MG PO TABS
ORAL_TABLET | ORAL | Status: AC
  Filled 2014-05-28: qty 2

## 2014-05-28 MED ORDER — NITROGLYCERIN 0.2 MG/ML ON CALL CATH LAB
INTRAVENOUS | Status: AC
  Filled 2014-05-28: qty 1

## 2014-05-28 MED ORDER — MIDAZOLAM HCL 2 MG/2ML IJ SOLN
INTRAMUSCULAR | Status: AC
  Filled 2014-05-28: qty 2

## 2014-05-28 MED ORDER — LIDOCAINE HCL (PF) 1 % IJ SOLN
INTRAMUSCULAR | Status: AC
  Filled 2014-05-28: qty 30

## 2014-05-28 MED ORDER — SODIUM CHLORIDE 0.9 % IJ SOLN: 3.0000 mL | INTRAMUSCULAR | Status: DC | PRN

## 2014-05-28 NOTE — Progress Notes (Signed)
SUBJECTIVE:  No further CP.    OBJECTIVE:   Vitals:   Filed Vitals:   05/28/14 0500 05/28/14 0600 05/28/14 0700 05/28/14 0741  BP: 116/60 103/49 103/51   Pulse: 71 74 69   Temp:    98.4 F (36.9 C)  TempSrc:    Oral  Resp:      Height:      Weight:      SpO2: 94% 98% 98%    I&O's:   Intake/Output Summary (Last 24 hours) at 05/28/14 0826 Last data filed at 05/28/14 0700  Gross per 24 hour  Intake 760.22 ml  Output    375 ml  Net 385.22 ml   TELEMETRY: Reviewed telemetry pt in NSR:     PHYSICAL EXAM General: Well developed, well nourished, in no acute distress Head:   Normal cephalic and atramatic  Lungs:   Clear bilaterally to auscultation. Heart:   HRRR S1 S2  No JVD.   Abdomen: abdomen soft and non-tender Msk:  Back normal,  Normal strength and tone for age. Extremities:  No edema.   Neuro: Alert  Psych:  Normal affect, responds appropriately   LABS: Basic Metabolic Panel:  Recent Labs  16/08/9605/27/15 0730 05/27/14 0235  NA 145 139  K 4.6 4.6  CL 105 101  CO2 20 23  GLUCOSE 147* 123*  BUN 21 21  CREATININE 0.98 0.87  CALCIUM 9.6 9.0   Liver Function Tests:  Recent Labs  05/26/14 0730 05/27/14 0235  AST 21 72*  ALT 12 18  ALKPHOS 66 63  BILITOT 0.4 0.4  PROT 7.7 7.2  ALBUMIN 4.0 3.7   No results found for this basename: LIPASE, AMYLASE,  in the last 72 hours CBC:  Recent Labs  05/26/14 0730 05/28/14 0214  WBC 8.8 10.3  HGB 14.3 12.5  HCT 44.3 39.8  MCV 91.5 91.1  PLT 193 163   Cardiac Enzymes:  Recent Labs  05/26/14 1522 05/26/14 2050 05/27/14 0237  TROPONINI 12.64* 11.81* 10.62*   BNP: No components found with this basename: POCBNP,  D-Dimer: No results found for this basename: DDIMER,  in the last 72 hours Hemoglobin A1C: No results found for this basename: HGBA1C,  in the last 72 hours Fasting Lipid Panel:  Recent Labs  05/27/14 0235  CHOL 203*  HDL 48  LDLCALC 112*  TRIG 213*  CHOLHDL 4.2   Thyroid Function  Tests:  Recent Labs  05/26/14 1522  TSH 1.890   Anemia Panel: No results found for this basename: VITAMINB12, FOLATE, FERRITIN, TIBC, IRON, RETICCTPCT,  in the last 72 hours Coag Panel:   Lab Results  Component Value Date   INR 1.00 05/26/2014    RADIOLOGY: Dg Chest Port 1 View  05/26/2014   CLINICAL DATA:  chest pain  EXAM: PORTABLE CHEST - 1 VIEW  COMPARISON:  04/04/2014  FINDINGS: Heart size upper limits normal. Bibasilar mild interstitial edema or infiltrates. No definite effusion.  Visualized skeletal structures are unremarkable.  IMPRESSION: 1. Mild bibasilar interstitial edema or infiltrates.   Electronically Signed   By: Oley Balmaniel  Hassell M.D.   On: 05/26/2014 08:59      ASSESSMENT: NSTEMI, dementia, HLD  PLAN:  Cardiac cath planned.  Troponin trending down.  COntinue current medical therapy including aspirin, heparin, statin, beta blocker.  Patient appears to have some mild dementia.  SHe does live alone.  Procedure explained and all questioned answered. Nurse will get consent from daughter.   LDL target 70, continue atorvastatin  Corky CraftsVARANASI,JAYADEEP S., MD  05/28/2014  8:26 AM

## 2014-05-28 NOTE — Care Management Note (Signed)
    Page 1 of 1   05/30/2014     10:25:52 AM CARE MANAGEMENT NOTE 05/30/2014  Patient:  [name],[name]   Account Number:  [id number]  Date Initiated:  05/28/2014  Documentation initiated by:  Maribel Hadley  Subjective/Objective Assessment:   Pt adm on 6/27 with acute NSTEMI.     Action/Plan:   Cath/PCI on 6/29.  Pt is on Brilinta.   Anticipated DC Date:  05/29/2014   Anticipated DC Plan:  HOME/SELF CARE      DC Planning Services  CM consult      Choice offered to / List presented to:             Status of service:  Completed, signed off Medicare Important Message given?  NA - LOS <3 / Initial given by admissions (If response is "NO", the following Medicare IM given date fields will be blank) Date Medicare IM given:   Medicare IM given by:   Date Additional Medicare IM given:   Additional Medicare IM given by:    Discharge Disposition:  HOME/SELF CARE  Per UR Regulation:  Reviewed for med. necessity/level of care/duration of stay  If discussed at Long Length of Stay Meetings, dates discussed:    Comments:  05/29/14 Sidney AceJulie Khamari Sheehan, RN, BSN [phone]  per rep at [facility]:  brilinta: tier 3; $45; no auth required  patient can use any pharmacy  Pt given Brilinta booklet with 30 day free trial card. Informed pt of copay information.

## 2014-05-28 NOTE — Progress Notes (Signed)
Pt stated she was short of breath. Pt stated she didn't have any pain anywhere, including chest pain. Pt then stated she felt a little better and would "ride it out". O2 sats remained in 90's on 2L. Will continue to monitor.

## 2014-05-28 NOTE — CV Procedure (Signed)
Cardiac Catheterization Operative Report  Eileen KnackRuby M Kelley [id number] 6/29/201512:18 PM Sonda PrimesAlex Plotnikov, MD  Procedure Performed:  1. Left Heart Catheterization 2. Selective Coronary Angiography 3.   PTCA/DES x 1 mid LAD 4.   PTCA/DES x 2 mid and distal RCA  Operator: Verne Carrowhristopher McAlhany, MD  Arterial access site:  Right radial artery.   Indication:  80 yo with h/o CAD with PTCA only LAD in 2002, known to have moderate disease in RCA and Circumflex admitted with NSTEMI.                                     Procedure Details: The risks, benefits, complications, treatment options, and expected outcomes were discussed with the patient. The patient and/or family concurred with the proposed plan, giving informed consent. The patient was brought to the cath lab after IV hydration was begun and oral premedication was given. The patient was further sedated with Versed and Fentanyl. The right wrist was assessed with a reverse Allens test which was positive. The right wrist was prepped and draped in a sterile fashion. 1% lidocaine was used for local anesthesia. Using the modified Seldinger access technique, a 5/6 French sheath was placed in the right radial artery. 3 mg Verapamil was given through the sheath. 3000 units IV heparin was given. Standard diagnostic catheters were used to perform selective coronary angiography. A pigtail catheter was used to measure LV pressures. She was found to have severe stenosis in the mid LAD and the distal RCA.   PCI Note: The patient was given a weight based bolus of Angiomax and a drip was started. She was given Brilinta 180 mg po x 1. I approached the LAD first.   Lesion #1: (mid LAD):  The left main was engaged with a XB LAD 3.5 guiding catheter. When the ACT was over 200, I passed a Cougar IC wire down the LAD. I then used a 2.5 x 12 balloon to pre-dilate the mid LAD stenosis. I then deployed a 2.5 x 15 mm Xience DES in the mid LAD. The stent was  post-dilated with a 2.5 x 12 mm [place] balloon x 1. The stenosis was taken from 99% down to 0%.   Lesion #2: (distal RCA): The RCA was engaged with a JR4 guiding catheter. I then passed the Cougar IC wire down the RCA. A 2.0 x 20 mm balloon was used to pre-dilate the mid and distal stenosis. A 2.5 x 38 mm Xience DES was deployed in the distal vessel. A 2.5 x 15 mm Xience DES was deployed in an overlapping fashion with the first stent on the proximal edge. The distal stented segment was post-dilated with a 2.5 x 12 mm [place] balloon x 4. The proximal stented segment was post-dilated with a 2.75 x 12 mm Perry balloon x 1. The stenoses were taken from 95% down to 0%.   The sheath was removed from the right radial artery and a Terumo hemostasis band was applied at the arteriotomy site on the right wrist. There were no immediate complications. The patient was taken to the recovery area in stable condition.   Hemodynamic Findings: Central aortic pressure: 102/47 Left ventricular pressure: 100/14/21  Angiographic Findings:  Left main:  20% distal stenosis.   Left Anterior Descending Artery: Large caliber vessel that courses to the apex. The proximal vessel has diffuse 30% stenosis. The mid vessel has a focal  99% stenosis. There are several small caliber diagonal branches.   Circumflex Artery: Large caliber vessel that terminates into a large caliber obtuse marginal branch. The OM branch has a focal 50-60% stenosis.   Right Coronary Artery: Large dominant vessel with 30% proximal stenosis, 80% mid stenosis followed by serial 95% stenoses in the distal vessel. The posterolateral branch and PDA are moderate in caliber and patent with mild diffuse disease.   Left Ventricular Angiogram: Deferred.   Impression: 1. Severe double vessel CAD 2. Severe stenosis mid LAD s/p successful PTCA/DES x 1 mid LAD 3. Severe stenosis distal RCA s/p successful PTCA/DES x 2 mid and distal RCA 4. NSTEMI  Recommendations: She will  need continued dual anti-platelet therapy with ASA and Brilinta for at least one year. Continue other cardiac meds.        Complications:  None. The patient tolerated the procedure well.

## 2014-05-28 NOTE — Progress Notes (Addendum)
ANTICOAGULATION CONSULT NOTE - Follow Up Consult  Pharmacy Consult for Heparin Indication: chest pain/ACS  Allergies  Allergen Reactions  . Simvastatin     REACTION: memory problem, myalgia    Patient Measurements: Height: 5\' 3"  (160 cm) Weight: 129 lb 12.8 oz (58.877 kg) IBW/kg (Calculated) : 52.4 Heparin Dosing Weight: ~59kg  Vital Signs: Temp: 98.4 F (36.9 C) (06/29 0741) Temp src: Oral (06/29 0741) BP: 103/51 mmHg (06/29 0700) Pulse Rate: 69 (06/29 0700)  Labs:  Recent Labs  05/26/14 0730 05/26/14 1522 05/26/14 2050 05/26/14 2355 05/27/14 0235 05/27/14 0237 05/28/14 0214  HGB 14.3  --   --   --   --   --  12.5  HCT 44.3  --   --   --   --   --  39.8  PLT 193  --   --   --   --   --  163  APTT 27  --   --   --   --   --   --   LABPROT 13.2  --   --   --   --   --   --   INR 1.00  --   --   --   --   --   --   HEPARINUNFRC  --   --   --  0.42 0.30  --  0.43  CREATININE 0.98  --   --   --  0.87  --   --   TROPONINI  --  12.64* 11.81*  --   --  10.62*  --     Estimated Creatinine Clearance: 39.8 ml/min (by C-G formula based on Cr of 0.87).   Medications:  Heparin @ 700 units/hr  Assessment: 86yof continues on heparin for NSTEMI awaiting cath today. Heparin level is therapeutic. CBC stable. No bleeding reported.  Goal of Therapy:  Heparin level 0.3-0.7 units/ml Monitor platelets by anticoagulation protocol: Yes   Plan:  1) Continue heparin at 700 units/hr 2) Follow up after cath  Fredrik RiggerMarkle, Jennifer Sue 05/28/2014,8:24 AM

## 2014-05-28 NOTE — Progress Notes (Signed)
Patient complains of shortness of breath.  SpO2 98 % on 2 liters.  O2 increased to 4 liters.  Pt was repositioned.  EKG obtained and was normal.  Will continue to monitor.

## 2014-05-28 NOTE — H&P (View-Only) (Signed)
 SUBJECTIVE:  No further CP.    OBJECTIVE:   Vitals:   Filed Vitals:   05/28/14 0500 05/28/14 0600 05/28/14 0700 05/28/14 0741  BP: 116/60 103/49 103/51   Pulse: 71 74 69   Temp:    98.4 F (36.9 C)  TempSrc:    Oral  Resp:      Height:      Weight:      SpO2: 94% 98% 98%    I&O's:   Intake/Output Summary (Last 24 hours) at 05/28/14 0826 Last data filed at 05/28/14 0700  Gross per 24 hour  Intake 760.22 ml  Output    375 ml  Net 385.22 ml   TELEMETRY: Reviewed telemetry pt in NSR:     PHYSICAL EXAM General: Well developed, well nourished, in no acute distress Head:   Normal cephalic and atramatic  Lungs:   Clear bilaterally to auscultation. Heart:   HRRR S1 S2  No JVD.   Abdomen: abdomen soft and non-tender Msk:  Back normal,  Normal strength and tone for age. Extremities:  No edema.   Neuro: Alert  Psych:  Normal affect, responds appropriately   LABS: Basic Metabolic Panel:  Recent Labs  05/26/14 0730 05/27/14 0235  NA 145 139  K 4.6 4.6  CL 105 101  CO2 20 23  GLUCOSE 147* 123*  BUN 21 21  CREATININE 0.98 0.87  CALCIUM 9.6 9.0   Liver Function Tests:  Recent Labs  05/26/14 0730 05/27/14 0235  AST 21 72*  ALT 12 18  ALKPHOS 66 63  BILITOT 0.4 0.4  PROT 7.7 7.2  ALBUMIN 4.0 3.7   No results found for this basename: LIPASE, AMYLASE,  in the last 72 hours CBC:  Recent Labs  05/26/14 0730 05/28/14 0214  WBC 8.8 10.3  HGB 14.3 12.5  HCT 44.3 39.8  MCV 91.5 91.1  PLT 193 163   Cardiac Enzymes:  Recent Labs  05/26/14 1522 05/26/14 2050 05/27/14 0237  TROPONINI 12.64* 11.81* 10.62*   BNP: No components found with this basename: POCBNP,  D-Dimer: No results found for this basename: DDIMER,  in the last 72 hours Hemoglobin A1C: No results found for this basename: HGBA1C,  in the last 72 hours Fasting Lipid Panel:  Recent Labs  05/27/14 0235  CHOL 203*  HDL 48  LDLCALC 112*  TRIG 213*  CHOLHDL 4.2   Thyroid Function  Tests:  Recent Labs  05/26/14 1522  TSH 1.890   Anemia Panel: No results found for this basename: VITAMINB12, FOLATE, FERRITIN, TIBC, IRON, RETICCTPCT,  in the last 72 hours Coag Panel:   Lab Results  Component Value Date   INR 1.00 05/26/2014    RADIOLOGY: Dg Chest Port 1 View  05/26/2014   CLINICAL DATA:  chest pain  EXAM: PORTABLE CHEST - 1 VIEW  COMPARISON:  04/04/2014  FINDINGS: Heart size upper limits normal. Bibasilar mild interstitial edema or infiltrates. No definite effusion.  Visualized skeletal structures are unremarkable.  IMPRESSION: 1. Mild bibasilar interstitial edema or infiltrates.   Electronically Signed   By: Daniel  Hassell M.D.   On: 05/26/2014 08:59      ASSESSMENT: NSTEMI, dementia, HLD  PLAN:  Cardiac cath planned.  Troponin trending down.  COntinue current medical therapy including aspirin, heparin, statin, beta blocker.  Patient appears to have some mild dementia.  SHe does live alone.  Procedure explained and all questioned answered. Nurse will get consent from daughter.   LDL target 70, continue atorvastatin    Corky CraftsVARANASI,JAYADEEP S., MD  05/28/2014  8:26 AM

## 2014-05-28 NOTE — Interval H&P Note (Signed)
History and Physical Interval Note:  05/28/2014 10:47 AM  [name]  has presented today for cardiac cath with the diagnosis of NSTEMI.  The various methods of treatment have been discussed with the patient and family. After consideration of risks, benefits and other options for treatment, the patient has consented to  Procedure(s): LEFT HEART CATHETERIZATION WITH CORONARY ANGIOGRAM (N/A) as a surgical intervention .  The patient's history has been reviewed, patient examined, no change in status, stable for surgery.  I have reviewed the patient's chart and labs.  Questions were answered to the patient's satisfaction.    Cath Lab Visit (complete for each Cath Lab visit)  Clinical Evaluation Leading to the Procedure:   ACS: Yes.    Non-ACS:    Anginal Classification: CCS IV   Anti-ischemic medical therapy: Maximal Therapy (2 or more classes of medications)  Non-Invasive Test Results: No non-invasive testing performed  Prior CABG: No previous CABG         MCALHANY,CHRISTOPHER

## 2014-05-29 ENCOUNTER — Other Ambulatory Visit: Payer: Self-pay | Admitting: Internal Medicine

## 2014-05-29 ENCOUNTER — Telehealth: Payer: Self-pay | Admitting: Internal Medicine

## 2014-05-29 DIAGNOSIS — A8100 Creutzfeldt-Jakob disease, unspecified: Secondary | ICD-10-CM

## 2014-05-29 DIAGNOSIS — F0280 Dementia in other diseases classified elsewhere without behavioral disturbance: Principal | ICD-10-CM

## 2014-05-29 DIAGNOSIS — M545 Low back pain, unspecified: Secondary | ICD-10-CM

## 2014-05-29 LAB — CBC
HCT: 36.4 % (ref 36.0–46.0)
HEMOGLOBIN: 11.8 g/dL — AB (ref 12.0–15.0)
MCH: 29.3 pg (ref 26.0–34.0)
MCHC: 32.4 g/dL (ref 30.0–36.0)
MCV: 90.3 fL (ref 78.0–100.0)
Platelets: 154 10*3/uL (ref 150–400)
RBC: 4.03 MIL/uL (ref 3.87–5.11)
RDW: 14.9 % (ref 11.5–15.5)
WBC: 11.7 10*3/uL — ABNORMAL HIGH (ref 4.0–10.5)

## 2014-05-29 LAB — BASIC METABOLIC PANEL
BUN: 21 mg/dL (ref 6–23)
CALCIUM: 8.9 mg/dL (ref 8.4–10.5)
CO2: 21 meq/L (ref 19–32)
Chloride: 101 mEq/L (ref 96–112)
Creatinine, Ser: 0.84 mg/dL (ref 0.50–1.10)
GFR calc Af Amer: 72 mL/min — ABNORMAL LOW (ref >90)
GFR, EST NON AFRICAN AMERICAN: 62 mL/min — AB (ref >90)
GLUCOSE: 120 mg/dL — AB (ref 70–99)
Potassium: 4.2 mEq/L (ref 3.7–5.3)
Sodium: 138 mEq/L (ref 137–147)

## 2014-05-29 MED ORDER — ACETAMINOPHEN 325 MG PO TABS: 650.0000 mg | ORAL_TABLET | ORAL | Status: AC | PRN

## 2014-05-29 MED ORDER — TICAGRELOR 90 MG PO TABS: 90.0000 mg | ORAL_TABLET | Freq: Two times a day (BID) | ORAL | Status: DC

## 2014-05-29 MED ORDER — NITROGLYCERIN 0.4 MG SL SUBL: 0.4000 mg | SUBLINGUAL_TABLET | SUBLINGUAL | Status: DC | PRN

## 2014-05-29 MED ORDER — ASPIRIN 81 MG PO TBEC: 81.0000 mg | DELAYED_RELEASE_TABLET | Freq: Every day | ORAL | Status: DC

## 2014-05-29 MED ORDER — ROSUVASTATIN CALCIUM 10 MG PO TABS: 10.0000 mg | ORAL_TABLET | Freq: Every day | ORAL | Status: DC

## 2014-05-29 MED FILL — Sodium Chloride IV Soln 0.9%: INTRAVENOUS | Qty: 50 | Status: AC

## 2014-05-29 NOTE — Progress Notes (Signed)
CARDIAC REHAB PHASE I   PRE:  Rate/Rhythm: 88 SR    BP: sitting 111/48    SaO2: 91 RA  MODE:  Ambulation: 300 ft   POST:  Rate/Rhythm: 97 SR    BP: sitting 126/53     SaO2: 97 RA  Pt c/o SOB in bed. SaO2 91 RA. Also has a cough that she sts is new. Able to walk with RW. SaO2 increased with decreased SOB however cough persisted rest of walk. To recliner, coughing ceased. SaO2 97 RA, denied SOB. Discussed with pt and daughter MI, stent, Brilinta, restrictions, low sodium/HH, walking daily, NTG and CRPII. Voiced understanding although pt needs daughter to help with her memory. Daughter sts pt forgets to take evening pills 2-3 times a week. Discussed pt changing her routine, etc. Daughter to make sure she is taking them and will discuss this more with cardiologist if it is a problem. Pt is interested in CRPII and will send referral to [place]. [phone]   Elissa LovettReeve, Randi PittstonKristan CES, ACSM 05/29/2014 12:30 PM

## 2014-05-29 NOTE — Progress Notes (Signed)
SUBJECTIVE:  No further chest pain  OBJECTIVE:   Vitals:   Filed Vitals:   05/29/14 0600 05/29/14 0700 05/29/14 0800 05/29/14 0900  BP: 113/49 120/50 114/50 125/53  Pulse: 69 68 72 83  Temp:      TempSrc:      Resp: 27 31 25  32  Height:      Weight:      SpO2: 97% 97% 96% 94%   I&O's:   Intake/Output Summary (Last 24 hours) at 05/29/14 0924 Last data filed at 05/29/14 0500  Gross per 24 hour  Intake    603 ml  Output    725 ml  Net   -122 ml   TELEMETRY: Reviewed telemetry pt in NSR:     PHYSICAL EXAM General: Well developed, well nourished, in no acute distress Head:   Normal cephalic and atramatic  Lungs:   Clear bilaterally to auscultation. Heart:   HRRR S1 S2  No JVD.   Abdomen: abdomen soft and non-tender Msk:  Back normal,  Normal strength and tone for age. Extremities:   No edema.  2+ right radial pulse Neuro: Alert and oriented. Psych:  Normal affect, responds appropriately   LABS: Basic Metabolic Panel:  Recent Labs  29/52/8406/28/15 0235 05/29/14 0322  NA 139 138  K 4.6 4.2  CL 101 101  CO2 23 21  GLUCOSE 123* 120*  BUN 21 21  CREATININE 0.87 0.84  CALCIUM 9.0 8.9   Liver Function Tests:  Recent Labs  05/27/14 0235  AST 72*  ALT 18  ALKPHOS 63  BILITOT 0.4  PROT 7.2  ALBUMIN 3.7   No results found for this basename: LIPASE, AMYLASE,  in the last 72 hours CBC:  Recent Labs  05/28/14 0214 05/29/14 0322  WBC 10.3 11.7*  HGB 12.5 11.8*  HCT 39.8 36.4  MCV 91.1 90.3  PLT 163 154   Cardiac Enzymes:  Recent Labs  05/26/14 1522 05/26/14 2050 05/27/14 0237  TROPONINI 12.64* 11.81* 10.62*   BNP: No components found with this basename: POCBNP,  D-Dimer: No results found for this basename: DDIMER,  in the last 72 hours Hemoglobin A1C: No results found for this basename: HGBA1C,  in the last 72 hours Fasting Lipid Panel:  Recent Labs  05/27/14 0235  CHOL 203*  HDL 48  LDLCALC 112*  TRIG 213*  CHOLHDL 4.2   Thyroid  Function Tests:  Recent Labs  05/26/14 1522  TSH 1.890   Anemia Panel: No results found for this basename: VITAMINB12, FOLATE, FERRITIN, TIBC, IRON, RETICCTPCT,  in the last 72 hours Coag Panel:   Lab Results  Component Value Date   INR 1.00 05/26/2014    RADIOLOGY: Dg Chest Port 1 View  05/26/2014   CLINICAL DATA:  chest pain  EXAM: PORTABLE CHEST - 1 VIEW  COMPARISON:  04/04/2014  FINDINGS: Heart size upper limits normal. Bibasilar mild interstitial edema or infiltrates. No definite effusion.  Visualized skeletal structures are unremarkable.  IMPRESSION: 1. Mild bibasilar interstitial edema or infiltrates.   Electronically Signed   By: Oley Balmaniel  Hassell M.D.   On: 05/26/2014 08:59      ASSESSMENT: s/p 2 vessel PCI for NSTEMI.  PLAN:  COntinue DAPT for a year.  Stressed importance of taking her meds.  SHould go over d/c instructions with her daughter.  Continue statin, ACE-I and beta blocker.  LDL was 112 upon arrival to the hospital.  Started atorvastatin to get LDL to target of 70.   F/u with  Dr. Clifton JamesMcAlhany.  OK for d/c if patient able to ambulate without difficulty.  Corky CraftsVARANASI,JAYADEEP S., MD  05/29/2014  9:24 AM

## 2014-05-29 NOTE — Discharge Summary (Signed)
Patient ID: [name],  MRN: [id number], DOB/AGE: 78/02/1927 78 y.o.  Admit date: 05/26/2014 Discharge date: 05/29/2014  Primary Care Provider: Walker Kehr, MD Primary Cardiologist: Dr Julianne Handler  Discharge Diagnoses Principal Problem:   NSTEMI (non-ST elevated myocardial infarction) Active Problems:   CAD- LAD PTCA '02, LAD/RCA DES 05/28/14   HYPERTENSION   HYPERLIPIDEMIA   DEMENTIA    Procedures: Cath/ RCA and LAD DES 05/28/14   Hospital Course:  80 year old female with mild dementia and a prior history of coronary artery disease status post LAD angioplasty in 2002, presented with chest pain and non-STEMI 05/26/14. She was admitted and stabilized. Her Troponin peaked at 12.64. Echo on 6/27 revealed an EF of 50% with moderate to severe MR and a PA pressure of 53 mmHg. Cath 05/28/14 revealed LAD and RCA disease and she underwent PCI with DES to these sites. She has a history of Simvastatin intolerance, (myalgia and worsening memory issues), Crestor was added at discharge. We feel she is stable for discharge 05/29/24. She will follow up in the office in two weeks.    Discharge Vitals:  Blood pressure 101/51, pulse 92, temperature 98.4 F (36.9 C), temperature source Oral, resp. rate 19, height 5' 3"  (1.6 m), weight 132 lb 15 oz (60.3 kg), SpO2 100.00%.    Labs: Results for orders placed during the hospital encounter of 05/26/14 (from the past 48 hour(s))  HEPARIN LEVEL (UNFRACTIONATED)     Status: None   Collection Time    05/28/14  2:14 AM      Result Value Ref Range   Heparin Unfractionated 0.43  0.30 - 0.70 IU/mL   Comment:            IF HEPARIN RESULTS ARE BELOW     EXPECTED VALUES, AND PATIENT     DOSAGE HAS BEEN CONFIRMED,     SUGGEST FOLLOW UP TESTING     OF ANTITHROMBIN III LEVELS.  CBC     Status: None   Collection Time    05/28/14  2:14 AM      Result Value Ref Range   WBC 10.3  4.0 - 10.5 K/uL   RBC 4.37  3.87 - 5.11 MIL/uL   Hemoglobin 12.5  12.0 -  15.0 g/dL   HCT 39.8  36.0 - 46.0 %   MCV 91.1  78.0 - 100.0 fL   MCH 28.6  26.0 - 34.0 pg   MCHC 31.4  30.0 - 36.0 g/dL   RDW 14.8  11.5 - 15.5 %   Platelets 163  150 - 400 K/uL  POCT ACTIVATED CLOTTING TIME     Status: None   Collection Time    05/28/14 11:26 AM      Result Value Ref Range   Activated Clotting Time 568    CBC     Status: Abnormal   Collection Time    05/29/14  3:22 AM      Result Value Ref Range   WBC 11.7 (*) 4.0 - 10.5 K/uL   RBC 4.03  3.87 - 5.11 MIL/uL   Hemoglobin 11.8 (*) 12.0 - 15.0 g/dL   HCT 36.4  36.0 - 46.0 %   MCV 90.3  78.0 - 100.0 fL   MCH 29.3  26.0 - 34.0 pg   MCHC 32.4  30.0 - 36.0 g/dL   RDW 14.9  11.5 - 15.5 %   Platelets 154  150 - 400 K/uL  BASIC METABOLIC PANEL     Status: Abnormal  Collection Time    05/29/14  3:22 AM      Result Value Ref Range   Sodium 138  137 - 147 mEq/L   Potassium 4.2  3.7 - 5.3 mEq/L   Chloride 101  96 - 112 mEq/L   CO2 21  19 - 32 mEq/L   Glucose, Bld 120 (*) 70 - 99 mg/dL   BUN 21  6 - 23 mg/dL   Creatinine, Ser 0.84  0.50 - 1.10 mg/dL   Calcium 8.9  8.4 - 10.5 mg/dL   GFR calc non Af Amer 62 (*) >90 mL/min   GFR calc Af Amer 72 (*) >90 mL/min   Comment: (NOTE)     The eGFR has been calculated using the CKD EPI equation.     This calculation has not been validated in all clinical situations.     eGFR's persistently <90 mL/min signify possible Chronic Kidney     Disease.    Disposition:      Follow-up Information   Follow up with Richardson Dopp, PA-C On 06/21/2014. (11:50 am)    Specialty:  Physician Assistant   Contact information:   [address] [place] [address] [phone]       Discharge Medications:    Medication List    STOP taking these medications       ciprofloxacin 250 MG tablet  Commonly known as:  CIPRO      TAKE these medications       acetaminophen 325 MG tablet  Commonly known as:  TYLENOL  Take 2 tablets (650 mg total) by mouth every 4  (four) hours as needed for headache or mild pain.     amLODipine 5 MG tablet  Commonly known as:  NORVASC  Take 2.5 mg by mouth daily.     aspirin 81 MG EC tablet  Take 1 tablet (81 mg total) by mouth daily.     clobetasol cream 0.05 %  Commonly known as:  TEMOVATE  Apply 1 application topically daily.     donepezil 10 MG tablet  Commonly known as:  ARICEPT  Take 10 mg by mouth at bedtime.     ibuprofen 400 MG tablet  Commonly known as:  ADVIL,MOTRIN  Take 1 tablet (400 mg total) by mouth every 8 (eight) hours as needed for moderate pain.     lisinopril 5 MG tablet  Commonly known as:  PRINIVIL,ZESTRIL  Take 5 mg by mouth daily.     loratadine 10 MG tablet  Commonly known as:  CLARITIN  Take 10 mg by mouth daily as needed for allergies.     Memantine HCl ER 28 MG Cp24  Commonly known as:  NAMENDA XR  Take 28 mg by mouth daily.     mirtazapine 15 MG tablet  Commonly known as:  REMERON  Take 15 mg by mouth at bedtime.     nebivolol 10 MG tablet  Commonly known as:  BYSTOLIC  Take 10 mg by mouth daily.     nitroGLYCERIN 0.4 MG SL tablet  Commonly known as:  NITROSTAT  Place 1 tablet (0.4 mg total) under the tongue every 5 (five) minutes as needed for chest pain.     rosuvastatin 10 MG tablet  Commonly known as:  CRESTOR  Take 1 tablet (10 mg total) by mouth daily.     ticagrelor 90 MG Tabs tablet  Commonly known as:  BRILINTA  Take 1 tablet (90 mg total) by mouth 2 (two)  times daily.         Duration of Discharge Encounter: Greater than 30 minutes including physician time.  Signed, Kerin Ransom PA-C 05/29/2014 11:03 AM  I have examined the patient and reviewed assessment and plan and discussed with patient.  Agree with above as stated.  Did well walking.  Plan d/c later today.  Stressed importance of DAPT.  F/u with Dr. Angelena Form.   VARANASI,JAYADEEP S.

## 2014-05-29 NOTE — Telephone Encounter (Signed)
Ok. What is the paperwork? Thx

## 2014-05-29 NOTE — Telephone Encounter (Signed)
[name] call request home health for Mrs. [name] due to mobility problem. Pt need to help at home. Pt's daughter stated we need the doctor written Rx and get it this authorize by [employer]. Please call pt's daughter Zella Ball([name]).

## 2014-05-30 NOTE — Telephone Encounter (Signed)
Left massage for [name] to call back, we need to know what kind of help they need for [name] at home?. Spoke with [name] [facility](PCC) doctor need to put in an order in the system and [facility] to get [facility] authorize [facility](PCC got the form).

## 2014-05-31 NOTE — Telephone Encounter (Signed)
Is it a HH referral - what agency? Pls ask Zella BallRobin what the need Thx

## 2014-05-31 NOTE — Telephone Encounter (Signed)
Ok Thx 

## 2014-05-31 NOTE — Telephone Encounter (Signed)
[name] called back.  [facility] is in network with her insurance.  They will pay for 35 hrs per week.  She needs help bathing, walking around the house to get some exercise, giving her medications.  Questions on how to take her meds.  Some don't have a next dose due so she does not know if she is to keep taking them.  Please call her about the meds.

## 2014-06-01 ENCOUNTER — Telehealth: Payer: Self-pay | Admitting: Physician Assistant

## 2014-06-01 NOTE — Telephone Encounter (Signed)
Pt's daughter called this evening after-hours regarding question about her mother. Patient has mild dementia and CAD with recent NSTEMI 05/26/14 requiring stenting; severe MR and PA pressure 53mmHg seen on echo. The patient began exhibiting intermittent burping/gasping - daughter asked her why she is doing this, and the patient said it's to get her breath back. She apparently isn't significantly SOB at the moment though. It is unclear if she has had any chest discomfort. Given recent cardiac issues and reports of "gasping" sensation it is difficult to fully evaluate over the phone - I advised she proceed to ER. Daughter says the patient already decided she doesn't want to go to ER. I told her she is allowed to refuse if in sound state of mind, but this is still my recommendation. Daughter verbalized understanding and gratitude and will talk more to her mother. Johann Gascoigne PA-C

## 2014-06-21 ENCOUNTER — Encounter: Payer: Medicare Other | Admitting: Physician Assistant

## 2014-06-27 ENCOUNTER — Encounter: Payer: Self-pay | Admitting: Physician Assistant

## 2014-06-27 ENCOUNTER — Ambulatory Visit (INDEPENDENT_AMBULATORY_CARE_PROVIDER_SITE_OTHER): Payer: Medicare Other | Admitting: Physician Assistant

## 2014-06-27 VITALS — BP 140/70 | HR 64 | Ht 63.0 in | Wt 128.0 lb

## 2014-06-27 DIAGNOSIS — I2510 Atherosclerotic heart disease of native coronary artery without angina pectoris: Secondary | ICD-10-CM

## 2014-06-27 DIAGNOSIS — I340 Nonrheumatic mitral (valve) insufficiency: Secondary | ICD-10-CM

## 2014-06-27 DIAGNOSIS — I059 Rheumatic mitral valve disease, unspecified: Secondary | ICD-10-CM

## 2014-06-27 DIAGNOSIS — E785 Hyperlipidemia, unspecified: Secondary | ICD-10-CM

## 2014-06-27 DIAGNOSIS — I10 Essential (primary) hypertension: Secondary | ICD-10-CM

## 2014-06-27 NOTE — Patient Instructions (Signed)
Your physician recommends that you continue on your current medications as directed. Please refer to the Current Medication list given to you today.   Your physician recommends that you return for lab work in: 4 WEEKS FOR FASTING LIPID AND LIVER PANEL   Your physician has requested that you have an echocardiogram. Echocardiography is a painless test that uses sound waves to create images of your heart. It provides your doctor with information about the size and shape of your heart and how well your heart's chambers and valves are working. This procedure takes approximately one hour. There are no restrictions for this procedure.  You have been referred to CARDIAC REHAB TO BE DONE AT [facility]  Your physician recommends that you schedule a follow-up appointment in: 6-8 WEEKS WITH DR. Clifton JamesMCALHANY

## 2014-06-27 NOTE — Progress Notes (Signed)
Cardiology Office Note    Date:  06/27/2014   ID:  [name] 02-23-1927, MRN [id number]  PCP:  Sonda Primes, MD  Cardiologist:  Dr. Verne Carrow      History of Present Illness: [name] is a 80 y.o. female with a hx of CAD s/p PTCA to LAD in 2002, ischemic CM with prior EF 30% (improved to normal by nuclear study in 2008), HTN, HL.  She was admitted 6/27-6/30 with a non-STEMI. Cardiac catheterization demonstrated high-grade disease in the mid LAD and mid to distal RCA. She underwent PCI with placement of a DES to the mid LAD and DES to the distal RCA. Of note, echocardiogram did demonstrate an EF of 50% with moderate to severe mitral regurgitation suspicious for ischemic mitral regurgitation and papillary muscle dysfunction.  She returns for follow up.  After discharge, she had a cough and questionable dyspnea. Since then, her symptoms have resolved. She denies significant dyspnea. She denies chest pain, orthopnea, PND or edema. She denies syncope.   Studies:  - LHC (05/28/14):  Distal left main 20%, proximal LAD 30%, mid LAD 99%, OM 50-60, proximal RCA 30%, mid RCA 80%/95%. >>>  PCI:  2.5 x 15 mm Xience DES in the mid LAD and 2.5 x 38 mm Xience DES in distal RCA  - Echo (05/26/14):  EF 50%, inferolateral and inferior hypokinesis, restrictive physiology, moderate to severe MR (question ischemic MR and papillary muscle dysfunction), moderate LAE, mild to moderate TR, PASP 53 mm Hg, coronary sinus markedly dilated, small jet of flow between the aorta and proximal pulmonary artery    Recent Labs: 05/26/2014: TSH 1.890  05/27/2014: ALT 18; HDL Cholesterol by NMR 48; LDL (calc) 112*  05/29/2014: Creatinine 0.84; Hemoglobin 11.8*; Potassium 4.2   Wt Readings from Last 3 Encounters:  06/27/14 128 lb (58.06 kg)  05/29/14 132 lb 15 oz (60.3 kg)  05/29/14 132 lb 15 oz (60.3 kg)     Past Medical History  Diagnosis Date  . CORONARY ARTERY DISEASE     a. 11/2000  Cath/PTCA: LM 20d, LAD 99p (PTCA), LCX 70p, RCA dom, 33m, EF 30 dist ant AK;  b. 2008 Myoview: EF 75%, small ant apical scar, mild peri-infarct ischemia.  [name] HYPERTENSION   . HYPERLIPIDEMIA   . DEPRESSION   . HERPES ZOSTER   . VERTIGO   . DIVERTICULOSIS, COLON   . OSTEOPOROSIS   . ANXIETY   . DEMENTIA   . History of Ischemic Cardiomyopathy     a. 11/2000 LV gram: EF 30%, dist ant AK;  b. 2008 MV EF 75%.  . Dementia     Current Outpatient Prescriptions  Medication Sig Dispense Refill  . acetaminophen (TYLENOL) 325 MG tablet Take 2 tablets (650 mg total) by mouth every 4 (four) hours as needed for headache or mild pain.      [name] amLODipine (NORVASC) 5 MG tablet Take 2.5 mg by mouth daily.      [name] aspirin EC 81 MG EC tablet Take 1 tablet (81 mg total) by mouth daily.      . clobetasol cream (TEMOVATE) 0.05 % Apply 1 application topically daily.      [name] donepezil (ARICEPT) 10 MG tablet TAKE 1 TABLET BY MOUTH EVERY DAY FOR MEMORY  90 tablet  3  . ibuprofen (ADVIL,MOTRIN) 400 MG tablet Take 1 tablet (400 mg total) by mouth every 8 (eight) hours as needed for moderate pain.  90 tablet  5  .  lisinopril (PRINIVIL,ZESTRIL) 5 MG tablet Take 5 mg by mouth daily.      [name]. loratadine (CLARITIN) 10 MG tablet Take 10 mg by mouth daily as needed for allergies.       . Memantine HCl ER (NAMENDA XR) 28 MG CP24 Take 28 mg by mouth daily.  90 capsule  3  . mirtazapine (REMERON) 15 MG tablet Take 15 mg by mouth at bedtime.      . nebivolol (BYSTOLIC) 10 MG tablet Take 10 mg by mouth daily.      . nitroGLYCERIN (NITROSTAT) 0.4 MG SL tablet Place 1 tablet (0.4 mg total) under the tongue every 5 (five) minutes as needed for chest pain.  25 tablet  2  . rosuvastatin (CRESTOR) 10 MG tablet Take 1 tablet (10 mg total) by mouth daily.  30 tablet  11  . ticagrelor (BRILINTA) 90 MG TABS tablet Take 1 tablet (90 mg total) by mouth 2 (two) times daily.  60 tablet  11   No current facility-administered medications for this  visit.    Allergies:   Simvastatin   Social History:  The patient  reports that she has never smoked. She has never used smokeless tobacco. She reports that she does not drink alcohol or use illicit drugs.   Family History:  The patient's family history includes Coronary artery disease in her other; Heart disease in her other; Hypertension in her other.   ROS:  Please see the history of present illness.   No bleeding problems   All other systems reviewed and negative.   PHYSICAL EXAM: VS:  BP 140/70  Pulse 64  Ht 5\' 3"  (1.6 m)  Wt 128 lb (58.06 kg)  BMI 22.68 kg/m2 Well nourished, well developed, in no acute distress HEENT: normal Neck: no JVD Cardiac:  normal S1, S2; RRR; no murmur Lungs:  clear to auscultation bilaterally, no wheezing, rhonchi or rales Abd: soft, nontender, no hepatomegaly Ext: no edemaright wrist without hematoma or mass  Skin: warm and dry Neuro:  CNs 2-12 intact, no focal abnormalities noted  EKG:  NSR, HR 64, normal axis, nonspecific ST-T wave changes     ASSESSMENT AND PLAN:  Coronary artery disease:  No angina. I suspect the cough she had after discharge may have been related to Brilinta. Symptoms have resolved. Continue aspirin, Brilinta, beta blocker, statin. She will be referred to cardiac rehabilitation. We discussed the importance of dual antiplatelet therapy for a minimum of 12 months post-MI.  Mitral regurgitation:  She does not have a murmur on exam.  She does not have any symptoms to suggest significant mitral regurgitation. I suspect that she had papillary muscle dysfunction secondary to ischemia. This likely has resolved. Repeat echocardiogram.  Other and unspecified hyperlipidemia:  Continue statin. Arrange followup lipids and LFTs in 4 weeks.  Unspecified essential hypertension:  Controlled.    Disposition:  Follow up with Dr. Verne Carrowhristopher McAlhany 8 weeks.     Signed, Brynda RimScott Weaver, PA-C, MHS 06/27/2014 11:44 AM    [facility] [address], [place], [place]  [address] Phone: [phone]; Fax: [phone]

## 2014-06-28 ENCOUNTER — Telehealth (HOSPITAL_COMMUNITY): Payer: Self-pay | Admitting: *Deleted

## 2014-06-28 NOTE — Telephone Encounter (Signed)
Talked to daughter regarding cardiac rehab. Daughter to check insurance benefits and talk with mother about her desire to attend exercise classes.  Contact info provided.  Alanson Alyarlette Blakley Michna RN, BSN

## 2014-07-10 ENCOUNTER — Other Ambulatory Visit: Payer: Self-pay | Admitting: Internal Medicine

## 2014-07-26 ENCOUNTER — Encounter: Payer: Self-pay | Admitting: Physician Assistant

## 2014-07-26 ENCOUNTER — Other Ambulatory Visit (INDEPENDENT_AMBULATORY_CARE_PROVIDER_SITE_OTHER): Payer: Medicare Other

## 2014-07-26 ENCOUNTER — Telehealth: Payer: Self-pay | Admitting: *Deleted

## 2014-07-26 ENCOUNTER — Ambulatory Visit (HOSPITAL_COMMUNITY): Payer: Medicare Other | Attending: Cardiovascular Disease | Admitting: Radiology

## 2014-07-26 DIAGNOSIS — E785 Hyperlipidemia, unspecified: Secondary | ICD-10-CM

## 2014-07-26 DIAGNOSIS — I2510 Atherosclerotic heart disease of native coronary artery without angina pectoris: Secondary | ICD-10-CM | POA: Insufficient documentation

## 2014-07-26 DIAGNOSIS — I379 Nonrheumatic pulmonary valve disorder, unspecified: Secondary | ICD-10-CM | POA: Diagnosis not present

## 2014-07-26 DIAGNOSIS — I517 Cardiomegaly: Secondary | ICD-10-CM | POA: Diagnosis not present

## 2014-07-26 DIAGNOSIS — I10 Essential (primary) hypertension: Secondary | ICD-10-CM | POA: Insufficient documentation

## 2014-07-26 DIAGNOSIS — I519 Heart disease, unspecified: Secondary | ICD-10-CM | POA: Insufficient documentation

## 2014-07-26 DIAGNOSIS — I340 Nonrheumatic mitral (valve) insufficiency: Secondary | ICD-10-CM

## 2014-07-26 DIAGNOSIS — I2589 Other forms of chronic ischemic heart disease: Secondary | ICD-10-CM | POA: Insufficient documentation

## 2014-07-26 LAB — LIPID PANEL
Cholesterol: 119 mg/dL (ref 0–200)
HDL: 56.7 mg/dL (ref >39.00)
LDL Cholesterol: 37 mg/dL (ref 0–99)
NONHDL: 62.3
Total CHOL/HDL Ratio: 2
Triglycerides: 129 mg/dL (ref 0.0–149.0)
VLDL: 25.8 mg/dL (ref 0.0–40.0)

## 2014-07-26 LAB — HEPATIC FUNCTION PANEL
ALT: 14 U/L (ref 0–35)
AST: 20 U/L (ref 0–37)
Albumin: 3.9 g/dL (ref 3.5–5.2)
Alkaline Phosphatase: 61 U/L (ref 39–117)
BILIRUBIN DIRECT: 0 mg/dL (ref 0.0–0.3)
Total Bilirubin: 0.5 mg/dL (ref 0.2–1.2)
Total Protein: 7.5 g/dL (ref 6.0–8.3)

## 2014-07-26 NOTE — Telephone Encounter (Signed)
lmptcb for lab and echo results

## 2014-07-26 NOTE — Progress Notes (Signed)
Echocardiogram performed.  

## 2014-07-30 NOTE — Telephone Encounter (Signed)
s/w pt's daughter Zella Ball who is caregiver for the pt. [name] aware of lab and echo results with verbal understanding to results.

## 2014-08-02 ENCOUNTER — Telehealth (HOSPITAL_COMMUNITY): Payer: Self-pay | Admitting: *Deleted

## 2014-08-02 NOTE — Telephone Encounter (Signed)
Did not receive return call from pt.  Contacted pt daughter to inquire her readiness to attend cardiac rehab.  Daughter states that pt is walking some and while the weather was cooler averaged one mile walks.  Pt's daughter readily admits to to elevated temps have kept her mother inside for walking.  Daughter declines to participate in Cardiac rehb at this time.  Advised that pt would have up to one year from cardiac event to complete cardiac rehab.  Verbalized understanding.

## 2014-08-14 ENCOUNTER — Ambulatory Visit: Payer: Medicare Other | Admitting: Internal Medicine

## 2014-08-16 ENCOUNTER — Other Ambulatory Visit: Payer: Self-pay | Admitting: Internal Medicine

## 2014-08-28 ENCOUNTER — Ambulatory Visit (INDEPENDENT_AMBULATORY_CARE_PROVIDER_SITE_OTHER): Payer: Medicare Other | Admitting: Cardiovascular Disease

## 2014-08-28 ENCOUNTER — Encounter: Payer: Self-pay | Admitting: Cardiovascular Disease

## 2014-08-28 VITALS — BP 158/64 | HR 70 | Ht 63.0 in | Wt 131.0 lb

## 2014-08-28 DIAGNOSIS — E785 Hyperlipidemia, unspecified: Secondary | ICD-10-CM

## 2014-08-28 DIAGNOSIS — I340 Nonrheumatic mitral (valve) insufficiency: Secondary | ICD-10-CM

## 2014-08-28 DIAGNOSIS — I10 Essential (primary) hypertension: Secondary | ICD-10-CM

## 2014-08-28 DIAGNOSIS — I059 Rheumatic mitral valve disease, unspecified: Secondary | ICD-10-CM

## 2014-08-28 DIAGNOSIS — I2510 Atherosclerotic heart disease of native coronary artery without angina pectoris: Secondary | ICD-10-CM

## 2014-08-28 NOTE — Patient Instructions (Signed)
Your physician wants you to follow-up in:  6 months. You will receive a reminder letter in the mail two months in advance. If you don't receive a letter, please call our office to schedule the follow-up appointment.   

## 2014-08-28 NOTE — Progress Notes (Signed)
History of Present Illness: 80 y.o. female with a hx of CAD s/p PTCA to LAD in 2002, ischemic CM with normal EF now, HTN, HLD here today for cardiac follow up. She has been followed in the past by Dr. Daleen SquibbWall. She was admitted 6/27-6/30/15 with a non-STEMI. Cardiac catheterization demonstrated high-grade disease in the mid LAD and mid to distal RCA. She underwent PCI with placement of a DES to the mid LAD and DES to the distal RCA. Of note, echocardiogram did demonstrate an EF of 50% with moderate to severe mitral regurgitation suspicious for ischemic mitral regurgitation and papillary muscle dysfunction. She was seen by Tereso NewcomerScott Weaver, PA-C for hospital f/u and was feeling better. Echo 07/26/14 with normal LV function and mild MR.   She is here today for f/u. She denies significant dyspnea. She denies chest pain, orthopnea, PND or edema. She denies syncope.   Primary Care Physician: Plotnikov  Last Lipid Profile:Lipid Panel     Component Value Date/Time   CHOL 119 07/26/2014 0925   TRIG 129.0 07/26/2014 0925   HDL 56.70 07/26/2014 0925   CHOLHDL 2 07/26/2014 0925   VLDL 25.8 07/26/2014 0925   LDLCALC 37 07/26/2014 0925     Past Medical History  Diagnosis Date  . CORONARY ARTERY DISEASE     a. 11/2000 Cath/PTCA: LM 20d, LAD 99p (PTCA), LCX 70p, RCA dom, 213m, EF 30 dist ant AK;  b. 2008 Myoview: EF 75%, small ant apical scar, mild peri-infarct ischemia.  [name]. HYPERTENSION   . HYPERLIPIDEMIA   . DEPRESSION   . HERPES ZOSTER   . VERTIGO   . DIVERTICULOSIS, COLON   . OSTEOPOROSIS   . ANXIETY   . DEMENTIA   . History of Ischemic Cardiomyopathy     a. 11/2000 LV gram: EF 30%, dist ant AK;  b. 2008 MV EF 75%.  . Dementia   . Hx of echocardiogram     Echo (8/15):  EF 55%, mild apical HK, Gr 1 DD, mild AI, mild MR, severe LAE, mod RAE, mod PI, PASP 38 mmHg    Past Surgical History  Procedure Laterality Date  . Ptca  12/09/2000    successful; of the proximal left anterior descending with  reduction of 99% narrowing to 30% with improvement of TIMI grade 1 to TIMI grade 3 flow  . Tonsillectomy and adenoidectomy  1943    Current Outpatient Prescriptions  Medication Sig Dispense Refill  . acetaminophen (TYLENOL) 325 MG tablet Take 2 tablets (650 mg total) by mouth every 4 (four) hours as needed for headache or mild pain.      [name]. amLODipine (NORVASC) 5 MG tablet TAKE 1/2 TABLET BY MOUTH ONCE DAILY  30 tablet  5  . aspirin EC 81 MG EC tablet Take 1 tablet (81 mg total) by mouth daily.      . clobetasol cream (TEMOVATE) 0.05 % Apply 1 application topically daily.      [name]. donepezil (ARICEPT) 10 MG tablet TAKE 1 TABLET BY MOUTH EVERY DAY FOR MEMORY  90 tablet  3  . lisinopril (PRINIVIL,ZESTRIL) 5 MG tablet Take 5 mg by mouth daily.      . mirtazapine (REMERON) 15 MG tablet Take 15 mg by mouth at bedtime.      [name]. NAMENDA XR 28 MG CP24 TAKE 1 CAPSULE BY MOUTH DAILY  90 capsule  1  . nebivolol (BYSTOLIC) 10 MG tablet Take 10 mg by mouth daily.      . nitroGLYCERIN (  NITROSTAT) 0.4 MG SL tablet Place 1 tablet (0.4 mg total) under the tongue every 5 (five) minutes as needed for chest pain.  25 tablet  2  . rosuvastatin (CRESTOR) 10 MG tablet Take 1 tablet (10 mg total) by mouth daily.  30 tablet  11  . ticagrelor (BRILINTA) 90 MG TABS tablet Take 1 tablet (90 mg total) by mouth 2 (two) times daily.  60 tablet  11   No current facility-administered medications for this visit.    Allergies  Allergen Reactions  . Simvastatin     REACTION: memory problem, myalgia    History   Social History  . Marital Status: Widowed    Spouse Name: N/A    Number of Children: N/A  . Years of Education: N/A   Occupational History  .      Retired   Social History Main Topics  . Smoking status: Never Smoker   . Smokeless tobacco: Never Used  . Alcohol Use: No  . Drug Use: No  . Sexual Activity: No   Other Topics Concern  . Not on file   Social History Narrative   Regular exercise-yes     Family History  Problem Relation Age of Onset  . Hypertension Other   . Heart disease Other   . Coronary artery disease Other   . Diabetes Mother   . Heart attack Mother   . Hypertension Daughter   . Sudden death Mother     Review of Systems:  As stated in the HPI and otherwise negative.   BP 158/64  Pulse 70  Ht 5\' 3"  (1.6 m)  Wt 131 lb (59.421 kg)  BMI 23.21 kg/m2  SpO2 98%  Physical Examination: General: Well developed, well nourished, NAD HEENT: OP clear, mucus membranes moist SKIN: warm, dry. No rashes. Neuro: No focal deficits Musculoskeletal: Muscle strength 5/5 all ext Psychiatric: Mood and affect normal Neck: No JVD, no carotid bruits, no thyromegaly, no lymphadenopathy. Lungs:Clear bilaterally, no wheezes, rhonci, crackles Cardiovascular: Regular rate and rhythm. No murmurs, gallops or rubs. Abdomen:Soft. Bowel sounds present. Non-tender.  Extremities: No lower extremity edema. Pulses are 2 + in the bilateral DP/PT.  LHC (05/28/14): Distal left main 20%, proximal LAD 30%, mid LAD 99%, OM 50-60, proximal RCA 30%, mid RCA 80%/95%. >>> PCI: 2.5 x 15 mm Xience DES in the mid LAD and 2.5 x 38 mm Xience DES in distal RCA  Left main: 20% distal stenosis.  Left Anterior Descending Artery: Large caliber vessel that courses to the apex. The proximal vessel has diffuse 30% stenosis. The mid vessel has a focal 99% stenosis. There are several small caliber diagonal branches.  Circumflex Artery: Large caliber vessel that terminates into a large caliber obtuse marginal branch. The OM branch has a focal 50-60% stenosis.  Right Coronary Artery: Large dominant vessel with 30% proximal stenosis, 80% mid stenosis followed by serial 95% stenoses in the distal vessel. The posterolateral branch and PDA are moderate in caliber and patent with mild diffuse disease.    Echo 07/26/14: Left ventricle: LVEF is approximately 55% with mild apical hypokinesis. The cavity size was normal. Wall  thickness was normal. Doppler parameters are consistent with abnormal left ventricular relaxation (grade 1 diastolic dysfunction). - Aortic valve: There was mild regurgitation. - Mitral valve: There was mild regurgitation. - Left atrium: The atrium was severely dilated. - Right atrium: The atrium was moderately dilated. - Pulmonic valve: There was moderate regurgitation. - Pulmonary arteries: PA peak pressure: 38 mm Hg (  S).  Assessment and Plan:   1. Coronary artery disease: Stable. No angina. Continue aspirin, Brilinta, beta blocker, statin. She may need to be changed to Plavix in January if her insurance coverage changes.   2. Mitral regurgitation: Mild by echo 07/26/14  3. Other and unspecified hyperlipidemia: Continue statin.   4. Unspecified essential hypertension: Elevated today. She will follow at home. If remains elevated, will call back and we can increase her Norvasc.

## 2014-09-11 ENCOUNTER — Ambulatory Visit (INDEPENDENT_AMBULATORY_CARE_PROVIDER_SITE_OTHER): Payer: Medicare Other | Admitting: Internal Medicine

## 2014-09-11 ENCOUNTER — Encounter: Payer: Self-pay | Admitting: Internal Medicine

## 2014-09-11 VITALS — BP 120/76 | HR 72 | Temp 98.2°F | Resp 16 | Wt 130.0 lb

## 2014-09-11 DIAGNOSIS — Z23 Encounter for immunization: Secondary | ICD-10-CM

## 2014-09-11 NOTE — Progress Notes (Signed)
Pre visit review using our clinic review tool, if applicable. No additional management support is needed unless otherwise documented below in the visit note. 

## 2014-09-11 NOTE — Progress Notes (Signed)
Subjective:   Admit date: 05/26/2014   Discharge date: 05/29/2014   Primary Care Provider: Sonda PrimesAlex Shelbylynn Walczyk, MD  Primary Cardiologist: Dr Sanjuana KavaMcAlhaney  Discharge Diagnoses  Principal Problem:  NSTEMI (non-ST elevated myocardial infarction)  Active Problems:  CAD- LAD PTCA '02, LAD/RCA DES 05/28/14  HYPERTENSION  HYPERLIPIDEMIA  DEMENTIA   Procedures: Cath/ RCA and LAD DES 05/28/14   Hospital Course: 80 year old female with mild dementia and a prior history of coronary artery disease status post LAD angioplasty in 2002, presented with chest pain and non-STEMI 05/26/14. She was admitted and stabilized. Her Troponin peaked at 12.64. Echo on 6/27 revealed an EF of 50% with moderate to severe MR and a PA pressure of 53 mmHg. Cath 05/28/14 revealed LAD and RCA disease and she underwent PCI with DES to these sites. She has a history of Simvastatin intolerance, (myalgia and worsening memory issues), Crestor was added at discharge. We feel she is stable for discharge 05/29/24. She will follow up in the office in two weeks.   Discharge Vitals:  Blood pressure 101/51, pulse 92, temperature 98.4 F (36.9 C), temperature source Oral, resp. rate 19, height 5\' 3"  (1.6 m), weight 132 lb 15 oz (60.3 kg), SpO2 100.00%.     Back Pain This is a chronic problem. The current episode started more than 1 year ago. The problem occurs daily. The problem has been waxing and waning since onset. The pain is present in the lumbar spine. The quality of the pain is described as aching. The pain is at a severity of 4/10. The pain is moderate. The symptoms are aggravated by bending and twisting. Pertinent negatives include no numbness or weakness. The treatment provided mild relief.  Off and on - not taking any meds for LBP   The patient is here to follow up on chronic dementia - worse, grief/depression, anxiety,CAD symptoms controlled with medicines   Wt Readings from Last 3 Encounters:  09/11/14 130 lb (58.968 kg)   08/28/14 131 lb (59.421 kg)  06/27/14 128 lb (58.06 kg)   BP Readings from Last 3 Encounters:  09/11/14 120/76  08/28/14 158/64  06/27/14 140/70       Review of Systems  Constitutional: Negative for activity change, appetite change, fatigue and unexpected weight change.  HENT: Negative for mouth sores and sinus pressure.   Eyes: Negative for visual disturbance.  Respiratory: Negative for chest tightness.   Genitourinary: Negative for frequency, difficulty urinating and vaginal pain.  Musculoskeletal: Positive for back pain. Negative for gait problem.  Skin: Negative for pallor.  Neurological: Negative for dizziness, tremors, weakness and numbness.  Psychiatric/Behavioral: Positive for decreased concentration. Negative for suicidal ideas, confusion, sleep disturbance and agitation. The patient is not nervous/anxious.        Objective:   Physical Exam  Constitutional: She appears well-developed. No distress.  HENT:  Head: Normocephalic.  Right Ear: External ear normal.  Left Ear: External ear normal.  Nose: Nose normal.  Mouth/Throat: Oropharynx is clear and moist.  Eyes: Conjunctivae are normal. Pupils are equal, round, and reactive to light. Right eye exhibits no discharge. Left eye exhibits no discharge.  Neck: Normal range of motion. Neck supple. No JVD present. No tracheal deviation present. No thyromegaly present.  Cardiovascular: Normal rate, regular rhythm and normal heart sounds.   Pulmonary/Chest: No stridor. No respiratory distress. She has no wheezes.  Abdominal: Soft. Bowel sounds are normal. She exhibits no distension and no mass. There is no tenderness. There is no rebound and no guarding.  Musculoskeletal: She exhibits no edema and no tenderness.  Lymphadenopathy:    She has no cervical adenopathy.  Neurological: She displays normal reflexes. No cranial nerve deficit. She exhibits normal muscle tone. Coordination normal.  Skin: No rash noted. No erythema.   Psychiatric: Her behavior is normal. Thought content normal.        Lab Results  Component Value Date   WBC 11.7* 05/29/2014   HGB 11.8* 05/29/2014   HCT 36.4 05/29/2014   PLT 154 05/29/2014   GLUCOSE 120* 05/29/2014   CHOL 119 07/26/2014   TRIG 129.0 07/26/2014   HDL 56.70 07/26/2014   LDLCALC 37 07/26/2014   ALT 14 07/26/2014   AST 20 07/26/2014   NA 138 05/29/2014   K 4.2 05/29/2014   CL 101 05/29/2014   CREATININE 0.84 05/29/2014   BUN 21 05/29/2014   CO2 21 05/29/2014   TSH 1.890 05/26/2014   INR 1.00 05/26/2014        Assessment & Plan:

## 2014-09-13 ENCOUNTER — Other Ambulatory Visit: Payer: Self-pay | Admitting: Internal Medicine

## 2014-09-13 ENCOUNTER — Other Ambulatory Visit: Payer: Self-pay | Admitting: *Deleted

## 2014-09-13 MED ORDER — LISINOPRIL 5 MG PO TABS: 5.0000 mg | ORAL_TABLET | Freq: Every day | ORAL | Status: DC

## 2014-10-01 ENCOUNTER — Encounter: Payer: Self-pay | Admitting: Internal Medicine

## 2014-11-08 ENCOUNTER — Encounter (HOSPITAL_COMMUNITY): Payer: Self-pay | Admitting: Cardiovascular Disease

## 2014-11-13 ENCOUNTER — Other Ambulatory Visit: Payer: Self-pay | Admitting: Internal Medicine

## 2014-12-11 ENCOUNTER — Other Ambulatory Visit: Payer: Self-pay | Admitting: Internal Medicine

## 2014-12-19 ENCOUNTER — Encounter: Payer: Self-pay | Admitting: Internal Medicine

## 2014-12-19 ENCOUNTER — Ambulatory Visit (INDEPENDENT_AMBULATORY_CARE_PROVIDER_SITE_OTHER): Payer: Medicare Other | Admitting: Internal Medicine

## 2014-12-19 VITALS — BP 150/70 | HR 63 | Temp 97.4°F | Ht 63.0 in | Wt 133.0 lb

## 2014-12-19 DIAGNOSIS — E785 Hyperlipidemia, unspecified: Secondary | ICD-10-CM

## 2014-12-19 DIAGNOSIS — Z23 Encounter for immunization: Secondary | ICD-10-CM | POA: Diagnosis not present

## 2014-12-19 DIAGNOSIS — Z0000 Encounter for general adult medical examination without abnormal findings: Secondary | ICD-10-CM | POA: Diagnosis not present

## 2014-12-19 DIAGNOSIS — R413 Other amnesia: Secondary | ICD-10-CM | POA: Diagnosis not present

## 2014-12-19 DIAGNOSIS — G309 Alzheimer's disease, unspecified: Secondary | ICD-10-CM | POA: Diagnosis not present

## 2014-12-19 DIAGNOSIS — F0280 Dementia in other diseases classified elsewhere without behavioral disturbance: Secondary | ICD-10-CM | POA: Diagnosis not present

## 2014-12-19 DIAGNOSIS — I10 Essential (primary) hypertension: Secondary | ICD-10-CM

## 2014-12-19 MED ORDER — CARVEDILOL 12.5 MG PO TABS: 12.5000 mg | ORAL_TABLET | Freq: Two times a day (BID) | ORAL | Status: DC

## 2014-12-19 MED ORDER — MEMANTINE HCL-DONEPEZIL HCL ER 28-10 MG PO CP24: 1.0000 | ORAL_CAPSULE | Freq: Every day | ORAL | Status: DC

## 2014-12-19 MED ORDER — CHOLECALCIFEROL 25 MCG (1000 UT) PO CAPS: 1000.0000 [IU] | ORAL_CAPSULE | Freq: Every day | ORAL | Status: AC

## 2014-12-19 MED ORDER — ATORVASTATIN CALCIUM 20 MG PO TABS: 20.0000 mg | ORAL_TABLET | Freq: Every day | ORAL | Status: DC

## 2014-12-19 NOTE — Assessment & Plan Note (Signed)
D/c Bystolic, start Coreg due to cost

## 2014-12-19 NOTE — Assessment & Plan Note (Addendum)
Namzariac 10-28 qd in place of Aricept/Namenda

## 2014-12-19 NOTE — Progress Notes (Signed)
    Subjective:   The patient is here for a wellness exam. The patient has been doing well overall without major physical or psychological issues going on lately. C/o meds are too $$$  6/15: NSTEMI (non-ST elevated myocardial infarction)  Active Problems:  CAD- LAD PTCA '02, LAD/RCA DES 05/28/14   The patient is here to follow up on chronic dementia - worse, grief/depression, anxiety,CAD symptoms controlled with medicines   Wt Readings from Last 3 Encounters:  12/19/14 133 lb (60.328 kg)  09/11/14 130 lb (58.968 kg)  08/28/14 131 lb (59.421 kg)   BP Readings from Last 3 Encounters:  12/19/14 150/70  09/11/14 120/76  08/28/14 158/64       Review of Systems  Constitutional: Negative for activity change, appetite change, fatigue and unexpected weight change.  HENT: Negative for mouth sores and sinus pressure.   Eyes: Negative for visual disturbance.  Respiratory: Negative for chest tightness.   Genitourinary: Negative for frequency, difficulty urinating and vaginal pain.  Musculoskeletal: Positive for back pain. Negative for gait problem.  Skin: Negative for pallor.  Neurological: Negative for dizziness, tremors, weakness and numbness.  Psychiatric/Behavioral: Positive for decreased concentration. Negative for suicidal ideas, confusion, sleep disturbance and agitation. The patient is not nervous/anxious.        Objective:   Physical Exam  Constitutional: She appears well-developed. No distress.  HENT:  Head: Normocephalic.  Right Ear: External ear normal.  Left Ear: External ear normal.  Nose: Nose normal.  Mouth/Throat: Oropharynx is clear and moist.  Eyes: Conjunctivae are normal. Pupils are equal, round, and reactive to light. Right eye exhibits no discharge. Left eye exhibits no discharge.  Neck: Normal range of motion. Neck supple. No JVD present. No tracheal deviation present. No thyromegaly present.  Cardiovascular: Normal rate, regular rhythm and normal heart  sounds.   Pulmonary/Chest: No stridor. No respiratory distress. She has no wheezes.  Abdominal: Soft. Bowel sounds are normal. She exhibits no distension and no mass. There is no tenderness. There is no rebound and no guarding.  Musculoskeletal: She exhibits no edema and no tenderness.  Lymphadenopathy:    She has no cervical adenopathy.  Neurological: She displays normal reflexes. No cranial nerve deficit. She exhibits normal muscle tone. Coordination normal.  Skin: No rash noted. No erythema.  Psychiatric: Her behavior is normal. Thought content normal.        Lab Results  Component Value Date   WBC 11.7* 05/29/2014   HGB 11.8* 05/29/2014   HCT 36.4 05/29/2014   PLT 154 05/29/2014   GLUCOSE 120* 05/29/2014   CHOL 119 07/26/2014   TRIG 129.0 07/26/2014   HDL 56.70 07/26/2014   LDLCALC 37 07/26/2014   ALT 14 07/26/2014   AST 20 07/26/2014   NA 138 05/29/2014   K 4.2 05/29/2014   CL 101 05/29/2014   CREATININE 0.84 05/29/2014   BUN 21 05/29/2014   CO2 21 05/29/2014   TSH 1.890 05/26/2014   INR 1.00 05/26/2014        Assessment & Plan:

## 2014-12-19 NOTE — Assessment & Plan Note (Signed)
D/c Crestor, start Lipitor due to cost Labs in 3 mo

## 2014-12-19 NOTE — Patient Instructions (Signed)
Preventive Care for Adults A healthy lifestyle and preventive care can promote health and wellness. Preventive health guidelines for women include the following key practices.  A routine yearly physical is a good way to check with your health care provider about your health and preventive screening. It is a chance to share any concerns and updates on your health and to receive a thorough exam.  Visit your dentist for a routine exam and preventive care every 6 months. Brush your teeth twice a day and floss once a day. Good oral hygiene prevents tooth decay and gum disease.  The frequency of eye exams is based on your age, health, family medical history, use of contact lenses, and other factors. Follow your health care provider's recommendations for frequency of eye exams.  Eat a healthy diet. Foods like vegetables, fruits, whole grains, low-fat dairy products, and lean protein foods contain the nutrients you need without too many calories. Decrease your intake of foods high in solid fats, added sugars, and salt. Eat the right amount of calories for you.Get information about a proper diet from your health care provider, if necessary.  Regular physical exercise is one of the most important things you can do for your health. Most adults should get at least 150 minutes of moderate-intensity exercise (any activity that increases your heart rate and causes you to sweat) each week. In addition, most adults need muscle-strengthening exercises on 2 or more days a week.  Maintain a healthy weight. The body mass index (BMI) is a screening tool to identify possible weight problems. It provides an estimate of body fat based on height and weight. Your health care provider can find your BMI and can help you achieve or maintain a healthy weight.For adults 20 years and older:  A BMI below 18.5 is considered underweight.  A BMI of 18.5 to 24.9 is normal.  A BMI of 25 to 29.9 is considered overweight.  A BMI of  30 and above is considered obese.  Maintain normal blood lipids and cholesterol levels by exercising and minimizing your intake of saturated fat. Eat a balanced diet with plenty of fruit and vegetables. Blood tests for lipids and cholesterol should begin at age 76 and be repeated every 5 years. If your lipid or cholesterol levels are high, you are over 50, or you are at high risk for heart disease, you may need your cholesterol levels checked more frequently.Ongoing high lipid and cholesterol levels should be treated with medicines if diet and exercise are not working.  If you smoke, find out from your health care provider how to quit. If you do not use tobacco, do not start.  Lung cancer screening is recommended for adults aged 22-80 years who are at high risk for developing lung cancer because of a history of smoking. A yearly low-dose CT scan of the lungs is recommended for people who have at least a 30-pack-year history of smoking and are a current smoker or have quit within the past 15 years. A pack year of smoking is smoking an average of 1 pack of cigarettes a day for 1 year (for example: 1 pack a day for 30 years or 2 packs a day for 15 years). Yearly screening should continue until the smoker has stopped smoking for at least 15 years. Yearly screening should be stopped for people who develop a health problem that would prevent them from having lung cancer treatment.  If you are pregnant, do not drink alcohol. If you are breastfeeding,  be very cautious about drinking alcohol. If you are not pregnant and choose to drink alcohol, do not have more than 1 drink per day. One drink is considered to be 12 ounces (355 mL) of beer, 5 ounces (148 mL) of wine, or 1.5 ounces (44 mL) of liquor.  Avoid use of street drugs. Do not share needles with anyone. Ask for help if you need support or instructions about stopping the use of drugs.  High blood pressure causes heart disease and increases the risk of  stroke. Your blood pressure should be checked at least every 1 to 2 years. Ongoing high blood pressure should be treated with medicines if weight loss and exercise do not work.  If you are 75-52 years old, ask your health care provider if you should take aspirin to prevent strokes.  Diabetes screening involves taking a blood sample to check your fasting blood sugar level. This should be done once every 3 years, after age 15, if you are within normal weight and without risk factors for diabetes. Testing should be considered at a younger age or be carried out more frequently if you are overweight and have at least 1 risk factor for diabetes.  Breast cancer screening is essential preventive care for women. You should practice "breast self-awareness." This means understanding the normal appearance and feel of your breasts and may include breast self-examination. Any changes detected, no matter how small, should be reported to a health care provider. Women in their 58s and 30s should have a clinical breast exam (CBE) by a health care provider as part of a regular health exam every 1 to 3 years. After age 16, women should have a CBE every year. Starting at age 53, women should consider having a mammogram (breast X-ray test) every year. Women who have a family history of breast cancer should talk to their health care provider about genetic screening. Women at a high risk of breast cancer should talk to their health care providers about having an MRI and a mammogram every year.  Breast cancer gene (BRCA)-related cancer risk assessment is recommended for women who have family members with BRCA-related cancers. BRCA-related cancers include breast, ovarian, tubal, and peritoneal cancers. Having family members with these cancers may be associated with an increased risk for harmful changes (mutations) in the breast cancer genes BRCA1 and BRCA2. Results of the assessment will determine the need for genetic counseling and  BRCA1 and BRCA2 testing.  Routine pelvic exams to screen for cancer are no longer recommended for nonpregnant women who are considered low risk for cancer of the pelvic organs (ovaries, uterus, and vagina) and who do not have symptoms. Ask your health care provider if a screening pelvic exam is right for you.  If you have had past treatment for cervical cancer or a condition that could lead to cancer, you need Pap tests and screening for cancer for at least 20 years after your treatment. If Pap tests have been discontinued, your risk factors (such as having a new sexual partner) need to be reassessed to determine if screening should be resumed. Some women have medical problems that increase the chance of getting cervical cancer. In these cases, your health care provider may recommend more frequent screening and Pap tests.  The HPV test is an additional test that may be used for cervical cancer screening. The HPV test looks for the virus that can cause the cell changes on the cervix. The cells collected during the Pap test can be  tested for HPV. The HPV test could be used to screen women aged 30 years and older, and should be used in women of any age who have unclear Pap test results. After the age of 30, women should have HPV testing at the same frequency as a Pap test.  Colorectal cancer can be detected and often prevented. Most routine colorectal cancer screening begins at the age of 50 years and continues through age 75 years. However, your health care provider may recommend screening at an earlier age if you have risk factors for colon cancer. On a yearly basis, your health care provider may provide home test kits to check for hidden blood in the stool. Use of a small camera at the end of a tube, to directly examine the colon (sigmoidoscopy or colonoscopy), can detect the earliest forms of colorectal cancer. Talk to your health care provider about this at age 50, when routine screening begins. Direct  exam of the colon should be repeated every 5-10 years through age 75 years, unless early forms of pre-cancerous polyps or small growths are found.  People who are at an increased risk for hepatitis B should be screened for this virus. You are considered at high risk for hepatitis B if:  You were born in a country where hepatitis B occurs often. Talk with your health care provider about which countries are considered high risk.  Your parents were born in a high-risk country and you have not received a shot to protect against hepatitis B (hepatitis B vaccine).  You have HIV or AIDS.  You use needles to inject street drugs.  You live with, or have sex with, someone who has hepatitis B.  You get hemodialysis treatment.  You take certain medicines for conditions like cancer, organ transplantation, and autoimmune conditions.  Hepatitis C blood testing is recommended for all people born from 1945 through 1965 and any individual with known risks for hepatitis C.  Practice safe sex. Use condoms and avoid high-risk sexual practices to reduce the spread of sexually transmitted infections (STIs). STIs include gonorrhea, chlamydia, syphilis, trichomonas, herpes, HPV, and human immunodeficiency virus (HIV). Herpes, HIV, and HPV are viral illnesses that have no cure. They can result in disability, cancer, and death.  You should be screened for sexually transmitted illnesses (STIs) including gonorrhea and chlamydia if:  You are sexually active and are younger than 24 years.  You are older than 24 years and your health care provider tells you that you are at risk for this type of infection.  Your sexual activity has changed since you were last screened and you are at an increased risk for chlamydia or gonorrhea. Ask your health care provider if you are at risk.  If you are at risk of being infected with HIV, it is recommended that you take a prescription medicine daily to prevent HIV infection. This is  called preexposure prophylaxis (PrEP). You are considered at risk if:  You are a heterosexual woman, are sexually active, and are at increased risk for HIV infection.  You take drugs by injection.  You are sexually active with a partner who has HIV.  Talk with your health care provider about whether you are at high risk of being infected with HIV. If you choose to begin PrEP, you should first be tested for HIV. You should then be tested every 3 months for as long as you are taking PrEP.  Osteoporosis is a disease in which the bones lose minerals and strength   with aging. This can result in serious bone fractures or breaks. The risk of osteoporosis can be identified using a bone density scan. Women ages 65 years and over and women at risk for fractures or osteoporosis should discuss screening with their health care providers. Ask your health care provider whether you should take a calcium supplement or vitamin D to reduce the rate of osteoporosis.  Menopause can be associated with physical symptoms and risks. Hormone replacement therapy is available to decrease symptoms and risks. You should talk to your health care provider about whether hormone replacement therapy is right for you.  Use sunscreen. Apply sunscreen liberally and repeatedly throughout the day. You should seek shade when your shadow is shorter than you. Protect yourself by wearing long sleeves, pants, a wide-brimmed hat, and sunglasses year round, whenever you are outdoors.  Once a month, do a whole body skin exam, using a mirror to look at the skin on your back. Tell your health care provider of new moles, moles that have irregular borders, moles that are larger than a pencil eraser, or moles that have changed in shape or color.  Stay current with required vaccines (immunizations).  Influenza vaccine. All adults should be immunized every year.  Tetanus, diphtheria, and acellular pertussis (Td, Tdap) vaccine. Pregnant women should  receive 1 dose of Tdap vaccine during each pregnancy. The dose should be obtained regardless of the length of time since the last dose. Immunization is preferred during the 27th-36th week of gestation. An adult who has not previously received Tdap or who does not know her vaccine status should receive 1 dose of Tdap. This initial dose should be followed by tetanus and diphtheria toxoids (Td) booster doses every 10 years. Adults with an unknown or incomplete history of completing a 3-dose immunization series with Td-containing vaccines should begin or complete a primary immunization series including a Tdap dose. Adults should receive a Td booster every 10 years.  Varicella vaccine. An adult without evidence of immunity to varicella should receive 2 doses or a second dose if she has previously received 1 dose. Pregnant females who do not have evidence of immunity should receive the first dose after pregnancy. This first dose should be obtained before leaving the health care facility. The second dose should be obtained 4-8 weeks after the first dose.  Human papillomavirus (HPV) vaccine. Females aged 13-26 years who have not received the vaccine previously should obtain the 3-dose series. The vaccine is not recommended for use in pregnant females. However, pregnancy testing is not needed before receiving a dose. If a female is found to be pregnant after receiving a dose, no treatment is needed. In that case, the remaining doses should be delayed until after the pregnancy. Immunization is recommended for any person with an immunocompromised condition through the age of 26 years if she did not get any or all doses earlier. During the 3-dose series, the second dose should be obtained 4-8 weeks after the first dose. The third dose should be obtained 24 weeks after the first dose and 16 weeks after the second dose.  Zoster vaccine. One dose is recommended for adults aged 60 years or older unless certain conditions are  present.  Measles, mumps, and rubella (MMR) vaccine. Adults born before 1957 generally are considered immune to measles and mumps. Adults born in 1957 or later should have 1 or more doses of MMR vaccine unless there is a contraindication to the vaccine or there is laboratory evidence of immunity to   each of the three diseases. A routine second dose of MMR vaccine should be obtained at least 28 days after the first dose for students attending postsecondary schools, health care workers, or international travelers. People who received inactivated measles vaccine or an unknown type of measles vaccine during 1963-1967 should receive 2 doses of MMR vaccine. People who received inactivated mumps vaccine or an unknown type of mumps vaccine before 1979 and are at high risk for mumps infection should consider immunization with 2 doses of MMR vaccine. For females of childbearing age, rubella immunity should be determined. If there is no evidence of immunity, females who are not pregnant should be vaccinated. If there is no evidence of immunity, females who are pregnant should delay immunization until after pregnancy. Unvaccinated health care workers born before 1957 who lack laboratory evidence of measles, mumps, or rubella immunity or laboratory confirmation of disease should consider measles and mumps immunization with 2 doses of MMR vaccine or rubella immunization with 1 dose of MMR vaccine.  Pneumococcal 13-valent conjugate (PCV13) vaccine. When indicated, a person who is uncertain of her immunization history and has no record of immunization should receive the PCV13 vaccine. An adult aged 19 years or older who has certain medical conditions and has not been previously immunized should receive 1 dose of PCV13 vaccine. This PCV13 should be followed with a dose of pneumococcal polysaccharide (PPSV23) vaccine. The PPSV23 vaccine dose should be obtained at least 8 weeks after the dose of PCV13 vaccine. An adult aged 19  years or older who has certain medical conditions and previously received 1 or more doses of PPSV23 vaccine should receive 1 dose of PCV13. The PCV13 vaccine dose should be obtained 1 or more years after the last PPSV23 vaccine dose.  Pneumococcal polysaccharide (PPSV23) vaccine. When PCV13 is also indicated, PCV13 should be obtained first. All adults aged 65 years and older should be immunized. An adult younger than age 65 years who has certain medical conditions should be immunized. Any person who resides in a nursing home or long-term care facility should be immunized. An adult smoker should be immunized. People with an immunocompromised condition and certain other conditions should receive both PCV13 and PPSV23 vaccines. People with human immunodeficiency virus (HIV) infection should be immunized as soon as possible after diagnosis. Immunization during chemotherapy or radiation therapy should be avoided. Routine use of PPSV23 vaccine is not recommended for American Indians, [place] Natives, or people younger than 65 years unless there are medical conditions that require PPSV23 vaccine. When indicated, people who have unknown immunization and have no record of immunization should receive PPSV23 vaccine. One-time revaccination 5 years after the first dose of PPSV23 is recommended for people aged 19-64 years who have chronic kidney failure, nephrotic syndrome, asplenia, or immunocompromised conditions. People who received 1-2 doses of PPSV23 before age 65 years should receive another dose of PPSV23 vaccine at age 65 years or later if at least 5 years have passed since the previous dose. Doses of PPSV23 are not needed for people immunized with PPSV23 at or after age 65 years.  Meningococcal vaccine. Adults with asplenia or persistent complement component deficiencies should receive 2 doses of quadrivalent meningococcal conjugate (MenACWY-D) vaccine. The doses should be obtained at least 2 months apart.  Microbiologists working with certain meningococcal bacteria, military recruits, people at risk during an outbreak, and people who travel to or live in countries with a high rate of meningitis should be immunized. A first-year college student up through age   21 years who is living in a residence hall should receive a dose if she did not receive a dose on or after her 16th birthday. Adults who have certain high-risk conditions should receive one or more doses of vaccine.  Hepatitis A vaccine. Adults who wish to be protected from this disease, have certain high-risk conditions, work with hepatitis A-infected animals, work in hepatitis A research labs, or travel to or work in countries with a high rate of hepatitis A should be immunized. Adults who were previously unvaccinated and who anticipate close contact with an international adoptee during the first 60 days after arrival in the [place] from a country with a high rate of hepatitis A should be immunized.  Hepatitis B vaccine. Adults who wish to be protected from this disease, have certain high-risk conditions, may be exposed to blood or other infectious body fluids, are household contacts or sex partners of hepatitis B positive people, are clients or workers in certain care facilities, or travel to or work in countries with a high rate of hepatitis B should be immunized.  Haemophilus influenzae type b (Hib) vaccine. A previously unvaccinated person with asplenia or sickle cell disease or having a scheduled splenectomy should receive 1 dose of Hib vaccine. Regardless of previous immunization, a recipient of a hematopoietic stem cell transplant should receive a 3-dose series 6-12 months after her successful transplant. Hib vaccine is not recommended for adults with HIV infection. Preventive Services / Frequency Ages 64 to 68 years  Blood pressure check.** / Every 1 to 2 years.  Lipid and cholesterol check.** / Every 5 years beginning at age  22.  Clinical breast exam.** / Every 3 years for women in their 88s and 53s.  BRCA-related cancer risk assessment.** / For women who have family members with a BRCA-related cancer (breast, ovarian, tubal, or peritoneal cancers).  Pap test.** / Every 2 years from ages 90 through 51. Every 3 years starting at age 21 through age 56 or 3 with a history of 3 consecutive normal Pap tests.  HPV screening.** / Every 3 years from ages 24 through ages 1 to 46 with a history of 3 consecutive normal Pap tests.  Hepatitis C blood test.** / For any individual with known risks for hepatitis C.  Skin self-exam. / Monthly.  Influenza vaccine. / Every year.  Tetanus, diphtheria, and acellular pertussis (Tdap, Td) vaccine.** / Consult your health care provider. Pregnant women should receive 1 dose of Tdap vaccine during each pregnancy. 1 dose of Td every 10 years.  Varicella vaccine.** / Consult your health care provider. Pregnant females who do not have evidence of immunity should receive the first dose after pregnancy.  HPV vaccine. / 3 doses over 6 months, if 72 and younger. The vaccine is not recommended for use in pregnant females. However, pregnancy testing is not needed before receiving a dose.  Measles, mumps, rubella (MMR) vaccine.** / You need at least 1 dose of MMR if you were born in 1957 or later. You may also need a 2nd dose. For females of childbearing age, rubella immunity should be determined. If there is no evidence of immunity, females who are not pregnant should be vaccinated. If there is no evidence of immunity, females who are pregnant should delay immunization until after pregnancy.  Pneumococcal 13-valent conjugate (PCV13) vaccine.** / Consult your health care provider.  Pneumococcal polysaccharide (PPSV23) vaccine.** / 1 to 2 doses if you smoke cigarettes or if you have certain conditions.  Meningococcal vaccine.** /  1 dose if you are age 19 to 21 years and a first-year college  student living in a residence hall, or have one of several medical conditions, you need to get vaccinated against meningococcal disease. You may also need additional booster doses.  Hepatitis A vaccine.** / Consult your health care provider.  Hepatitis B vaccine.** / Consult your health care provider.  Haemophilus influenzae type b (Hib) vaccine.** / Consult your health care provider. Ages 40 to 64 years  Blood pressure check.** / Every 1 to 2 years.  Lipid and cholesterol check.** / Every 5 years beginning at age 20 years.  Lung cancer screening. / Every year if you are aged 55-80 years and have a 30-pack-year history of smoking and currently smoke or have quit within the past 15 years. Yearly screening is stopped once you have quit smoking for at least 15 years or develop a health problem that would prevent you from having lung cancer treatment.  Clinical breast exam.** / Every year after age 40 years.  BRCA-related cancer risk assessment.** / For women who have family members with a BRCA-related cancer (breast, ovarian, tubal, or peritoneal cancers).  Mammogram.** / Every year beginning at age 40 years and continuing for as long as you are in good health. Consult with your health care provider.  Pap test.** / Every 3 years starting at age 30 years through age 65 or 70 years with a history of 3 consecutive normal Pap tests.  HPV screening.** / Every 3 years from ages 30 years through ages 65 to 70 years with a history of 3 consecutive normal Pap tests.  Fecal occult blood test (FOBT) of stool. / Every year beginning at age 50 years and continuing until age 75 years. You may not need to do this test if you get a colonoscopy every 10 years.  Flexible sigmoidoscopy or colonoscopy.** / Every 5 years for a flexible sigmoidoscopy or every 10 years for a colonoscopy beginning at age 50 years and continuing until age 75 years.  Hepatitis C blood test.** / For all people born from 1945 through  1965 and any individual with known risks for hepatitis C.  Skin self-exam. / Monthly.  Influenza vaccine. / Every year.  Tetanus, diphtheria, and acellular pertussis (Tdap/Td) vaccine.** / Consult your health care provider. Pregnant women should receive 1 dose of Tdap vaccine during each pregnancy. 1 dose of Td every 10 years.  Varicella vaccine.** / Consult your health care provider. Pregnant females who do not have evidence of immunity should receive the first dose after pregnancy.  Zoster vaccine.** / 1 dose for adults aged 60 years or older.  Measles, mumps, rubella (MMR) vaccine.** / You need at least 1 dose of MMR if you were born in 1957 or later. You may also need a 2nd dose. For females of childbearing age, rubella immunity should be determined. If there is no evidence of immunity, females who are not pregnant should be vaccinated. If there is no evidence of immunity, females who are pregnant should delay immunization until after pregnancy.  Pneumococcal 13-valent conjugate (PCV13) vaccine.** / Consult your health care provider.  Pneumococcal polysaccharide (PPSV23) vaccine.** / 1 to 2 doses if you smoke cigarettes or if you have certain conditions.  Meningococcal vaccine.** / Consult your health care provider.  Hepatitis A vaccine.** / Consult your health care provider.  Hepatitis B vaccine.** / Consult your health care provider.  Haemophilus influenzae type b (Hib) vaccine.** / Consult your health care provider. Ages 65   years and over  Blood pressure check.** / Every 1 to 2 years.  Lipid and cholesterol check.** / Every 5 years beginning at age 22 years.  Lung cancer screening. / Every year if you are aged 73-80 years and have a 30-pack-year history of smoking and currently smoke or have quit within the past 15 years. Yearly screening is stopped once you have quit smoking for at least 15 years or develop a health problem that would prevent you from having lung cancer  treatment.  Clinical breast exam.** / Every year after age 4 years.  BRCA-related cancer risk assessment.** / For women who have family members with a BRCA-related cancer (breast, ovarian, tubal, or peritoneal cancers).  Mammogram.** / Every year beginning at age 40 years and continuing for as long as you are in good health. Consult with your health care provider.  Pap test.** / Every 3 years starting at age 9 years through age 34 or 91 years with 3 consecutive normal Pap tests. Testing can be stopped between 65 and 70 years with 3 consecutive normal Pap tests and no abnormal Pap or HPV tests in the past 10 years.  HPV screening.** / Every 3 years from ages 57 years through ages 64 or 45 years with a history of 3 consecutive normal Pap tests. Testing can be stopped between 65 and 70 years with 3 consecutive normal Pap tests and no abnormal Pap or HPV tests in the past 10 years.  Fecal occult blood test (FOBT) of stool. / Every year beginning at age 15 years and continuing until age 17 years. You may not need to do this test if you get a colonoscopy every 10 years.  Flexible sigmoidoscopy or colonoscopy.** / Every 5 years for a flexible sigmoidoscopy or every 10 years for a colonoscopy beginning at age 86 years and continuing until age 71 years.  Hepatitis C blood test.** / For all people born from [birth year] through 1963 and any individual with known risks for hepatitis C.  Osteoporosis screening.** / A one-time screening for women ages 83 years and over and women at risk for fractures or osteoporosis.  Skin self-exam. / Monthly.  Influenza vaccine. / Every year.  Tetanus, diphtheria, and acellular pertussis (Tdap/Td) vaccine.** / 1 dose of Td every 10 years.  Varicella vaccine.** / Consult your health care provider.  Zoster vaccine.** / 1 dose for adults aged 61 years or older.  Pneumococcal 13-valent conjugate (PCV13) vaccine.** / Consult your health care provider.  Pneumococcal  polysaccharide (PPSV23) vaccine.** / 1 dose for all adults aged 28 years and older.  Meningococcal vaccine.** / Consult your health care provider.  Hepatitis A vaccine.** / Consult your health care provider.  Hepatitis B vaccine.** / Consult your health care provider.  Haemophilus influenzae type b (Hib) vaccine.** / Consult your health care provider. ** Family history and personal history of risk and conditions may change your health care provider's recommendations. Document Released: 01/12/2002 Document Revised: 04/02/2014 Document Reviewed: 04/13/2011 [facility] Patient Information 2015 [place], [place]. This information is not intended to replace advice given to you by your health care provider. Make sure you discuss any questions you have with your health care provider.

## 2014-12-19 NOTE — Assessment & Plan Note (Signed)
Continue with current prescription therapy as reflected on the Med list.  

## 2014-12-19 NOTE — Assessment & Plan Note (Signed)

## 2014-12-20 ENCOUNTER — Telehealth: Payer: Self-pay | Admitting: Internal Medicine

## 2014-12-20 NOTE — Telephone Encounter (Signed)
emmi emailed °

## 2015-02-19 ENCOUNTER — Encounter: Payer: Self-pay | Admitting: Cardiovascular Disease

## 2015-02-19 ENCOUNTER — Ambulatory Visit (INDEPENDENT_AMBULATORY_CARE_PROVIDER_SITE_OTHER): Payer: Medicare Other | Admitting: Cardiovascular Disease

## 2015-02-19 VITALS — BP 136/60 | HR 71 | Ht 63.0 in | Wt 132.4 lb

## 2015-02-19 DIAGNOSIS — I340 Nonrheumatic mitral (valve) insufficiency: Secondary | ICD-10-CM | POA: Diagnosis not present

## 2015-02-19 DIAGNOSIS — E785 Hyperlipidemia, unspecified: Secondary | ICD-10-CM

## 2015-02-19 DIAGNOSIS — I10 Essential (primary) hypertension: Secondary | ICD-10-CM | POA: Diagnosis not present

## 2015-02-19 DIAGNOSIS — I2510 Atherosclerotic heart disease of native coronary artery without angina pectoris: Secondary | ICD-10-CM

## 2015-02-19 MED ORDER — CLOPIDOGREL BISULFATE 75 MG PO TABS: 75.0000 mg | ORAL_TABLET | Freq: Every day | ORAL | Status: DC

## 2015-02-19 NOTE — Progress Notes (Signed)
CC: Shortness of breath  History of Present Illness: 81 y.o. female with history of CAD s/p PTCA to LAD in 2002, PCI/DES mid LAD and mid to distal RCA in 2015, ischemic CM with normal EF now, HTN, HLD here today for cardiac follow up. She has been followed in the past by Dr. Daleen Squibb. She was admitted 6/27-6/30/15 with a non-STEMI. Cardiac catheterization demonstrated high-grade disease in the mid LAD and mid to distal RCA. She underwent PCI with placement of a DES in the mid LAD and DES in the distal RCA. Of note, echocardiogram did demonstrate an EF of 50% with moderate to severe mitral regurgitation suspicious for ischemic mitral regurgitation and papillary muscle dysfunction. She was seen by Tereso Newcomer, PA-C for hospital f/u and was feeling better. Echo 07/26/14 with normal LV function and mild MR.   She is here today for f/u. She denies significant dyspnea. She denies chest pain, orthopnea, PND or edema. She denies syncope.   Primary Care Physician: Plotnikov  Last Lipid Profile:Lipid Panel     Component Value Date/Time   CHOL 119 07/26/2014 0925   TRIG 129.0 07/26/2014 0925   HDL 56.70 07/26/2014 0925   CHOLHDL 2 07/26/2014 0925   VLDL 25.8 07/26/2014 0925   LDLCALC 37 07/26/2014 0925    Past Medical History  Diagnosis Date  . CORONARY ARTERY DISEASE     a. 11/2000 Cath/PTCA: LM 20d, LAD 99p (PTCA), LCX 70p, RCA dom, 2m, EF 30 dist ant AK;  b. 2008 Myoview: EF 75%, small ant apical scar, mild peri-infarct ischemia.  [name] HYPERTENSION   . HYPERLIPIDEMIA   . DEPRESSION   . HERPES ZOSTER   . VERTIGO   . DIVERTICULOSIS, COLON   . OSTEOPOROSIS   . ANXIETY   . DEMENTIA   . History of Ischemic Cardiomyopathy     a. 11/2000 LV gram: EF 30%, dist ant AK;  b. 2008 MV EF 75%.  . Dementia   . Hx of echocardiogram     Echo (8/15):  EF 55%, mild apical HK, Gr 1 DD, mild AI, mild MR, severe LAE, mod RAE, mod PI, PASP 38 mmHg    Past Surgical History  Procedure Laterality Date  . Ptca   12/09/2000    successful; of the proximal left anterior descending with reduction of 99% narrowing to 30% with improvement of TIMI grade 1 to TIMI grade 3 flow  . Tonsillectomy and adenoidectomy  1943  . Left heart catheterization with coronary angiogram N/A 05/28/2014    Procedure: LEFT HEART CATHETERIZATION WITH CORONARY ANGIOGRAM;  Surgeon: Kathleene Hazel, MD;  Location: [facility] CATH LAB;  Service: Cardiovascular;  Laterality: N/A;  . Percutaneous coronary stent intervention (pci-s)  05/28/2014    Procedure: PERCUTANEOUS CORONARY STENT INTERVENTION (PCI-S);  Surgeon: Kathleene Hazel, MD;  Location: [facility] CATH LAB;  Service: Cardiovascular;;    Current Outpatient Prescriptions  Medication Sig Dispense Refill  . acetaminophen (TYLENOL) 325 MG tablet Take 2 tablets (650 mg total) by mouth every 4 (four) hours as needed for headache or mild pain.    [name] amLODipine (NORVASC) 5 MG tablet TAKE 1/2 TABLET BY MOUTH ONCE DAILY 30 tablet 5  . aspirin EC 81 MG EC tablet Take 1 tablet (81 mg total) by mouth daily.    [name] atorvastatin (LIPITOR) 20 MG tablet Take 1 tablet (20 mg total) by mouth daily. 30 tablet 11  . carvedilol (COREG) 12.5 MG tablet Take 1 tablet (12.5 mg total) by mouth 2 (  two) times daily with a meal. 60 tablet 11  . Cholecalciferol ([employer]S VITAMIN D3) 1000 UNITS capsule Take 1 capsule (1,000 Units total) by mouth daily. 100 capsule 3  . lisinopril (PRINIVIL,ZESTRIL) 5 MG tablet Take 1 tablet (5 mg total) by mouth daily. 90 tablet 3  . Memantine HCl-Donepezil HCl (NAMZARIC) 28-10 MG CP24 Take 1 capsule by mouth daily. 30 capsule 11  . mirtazapine (REMERON) 15 MG tablet TAKE 1 TABLET BY MOUTH AT BEDTIME BETWEEN 6-7 PM 30 tablet 5  . nitroGLYCERIN (NITROSTAT) 0.4 MG SL tablet Place 1 tablet (0.4 mg total) under the tongue every 5 (five) minutes as needed for chest pain. 25 tablet 2  . ticagrelor (BRILINTA) 90 MG TABS tablet Take 1 tablet (90 mg total) by mouth 2 (two) times daily. 60 tablet  11   No current facility-administered medications for this visit.    Allergies  Allergen Reactions  . Simvastatin     REACTION: memory problem, myalgia    History   Social History  . Marital Status: Widowed    Spouse Name: N/A  . Number of Children: N/A  . Years of Education: N/A   Occupational History  .      Retired   Social History Main Topics  . Smoking status: Never Smoker   . Smokeless tobacco: Never Used  . Alcohol Use: No  . Drug Use: No  . Sexual Activity: No   Other Topics Concern  . Not on file   Social History Narrative   Regular exercise-yes    Family History  Problem Relation Age of Onset  . Hypertension Other   . Heart disease Other   . Coronary artery disease Other   . Diabetes Mother   . Heart attack Mother   . Hypertension Daughter   . Sudden death Mother     Review of Systems:  As stated in the HPI and otherwise negative.   BP 136/60 mmHg  Pulse 71  Ht 5\' 3"  (1.6 m)  Wt 132 lb 6.4 oz (60.056 kg)  BMI 23.46 kg/m2  Physical Examination: General: Well developed, well nourished, NAD HEENT: OP clear, mucus membranes moist SKIN: warm, dry. No rashes. Neuro: No focal deficits Musculoskeletal: Muscle strength 5/5 all ext Psychiatric: Mood and affect normal Neck: No JVD, no carotid bruits, no thyromegaly, no lymphadenopathy. Lungs:Clear bilaterally, no wheezes, rhonci, crackles Cardiovascular: Regular rate and rhythm. No murmurs, gallops or rubs. Abdomen:Soft. Bowel sounds present. Non-tender.  Extremities: No lower extremity edema. Pulses are 2 + in the bilateral DP/PT.  LHC (05/28/14): Distal left main 20%, proximal LAD 30%, mid LAD 99%, OM 50-60, proximal RCA 30%, mid RCA 80%/95%. >>> PCI: 2.5 x 15 mm Xience DES in the mid LAD and 2.5 x 38 mm Xience DES in distal RCA  Left main: 20% distal stenosis.  Left Anterior Descending Artery: Large caliber vessel that courses to the apex. The proximal vessel has diffuse 30% stenosis. The mid  vessel has a focal 99% stenosis. There are several small caliber diagonal branches.  Circumflex Artery: Large caliber vessel that terminates into a large caliber obtuse marginal branch. The OM branch has a focal 50-60% stenosis.  Right Coronary Artery: Large dominant vessel with 30% proximal stenosis, 80% mid stenosis followed by serial 95% stenoses in the distal vessel. The posterolateral branch and PDA are moderate in caliber and patent with mild diffuse disease.   Echo 07/26/14: Left ventricle: LVEF is approximately 55% with mild apical hypokinesis. The cavity size was normal.  Wall thickness was normal. Doppler parameters are consistent with abnormal left ventricular relaxation (grade 1 diastolic dysfunction). - Aortic valve: There was mild regurgitation. - Mitral valve: There was mild regurgitation. - Left atrium: The atrium was severely dilated. - Right atrium: The atrium was moderately dilated. - Pulmonic valve: There was moderate regurgitation. - Pulmonary arteries: PA peak pressure: 38 mm Hg (S).  EKG: NSR, rate 71 bpm. PAC. Reviewed by me  Assessment and Plan:   1. Coronary artery disease: Stable. No angina. Continue aspirin, beta blocker, statin. Will change Brilinta to Plavix 75 mg daily.    2. Mitral regurgitation: Mild by echo 07/26/14.   3. Hyperlipidemia: Lipids well controlled. Continue statin.   4. Essential hypertension: BP controlled. No changes.

## 2015-02-19 NOTE — Patient Instructions (Addendum)
Your physician wants you to follow-up in:  6 months. . You will receive a reminder letter in the mail two months in advance. If you don't receive a letter, please call our office to schedule the follow-up appointment.  Your physician has recommended you make the following change in your medication:  Stop Brilinta. Start Clopidogrel 75 mg by mouth daily.      

## 2015-03-07 ENCOUNTER — Encounter (HOSPITAL_COMMUNITY): Payer: Self-pay | Admitting: Emergency Medicine

## 2015-03-07 ENCOUNTER — Ambulatory Visit: Payer: Medicare Other | Admitting: Internal Medicine

## 2015-03-07 ENCOUNTER — Emergency Department (HOSPITAL_COMMUNITY)
Admission: EM | Admit: 2015-03-07 | Discharge: 2015-03-07 | Disposition: A | Discharge status: Home / Self Care | Payer: Medicare Other | Attending: Emergency Medicine | Admitting: Emergency Medicine

## 2015-03-07 DIAGNOSIS — E785 Hyperlipidemia, unspecified: Secondary | ICD-10-CM | POA: Diagnosis not present

## 2015-03-07 DIAGNOSIS — F329 Major depressive disorder, single episode, unspecified: Secondary | ICD-10-CM | POA: Diagnosis not present

## 2015-03-07 DIAGNOSIS — Z7902 Long term (current) use of antithrombotics/antiplatelets: Secondary | ICD-10-CM | POA: Insufficient documentation

## 2015-03-07 DIAGNOSIS — M545 Low back pain, unspecified: Secondary | ICD-10-CM

## 2015-03-07 DIAGNOSIS — N390 Urinary tract infection, site not specified: Secondary | ICD-10-CM | POA: Diagnosis not present

## 2015-03-07 DIAGNOSIS — F419 Anxiety disorder, unspecified: Secondary | ICD-10-CM | POA: Diagnosis not present

## 2015-03-07 DIAGNOSIS — I10 Essential (primary) hypertension: Secondary | ICD-10-CM | POA: Insufficient documentation

## 2015-03-07 DIAGNOSIS — Z9889 Other specified postprocedural states: Secondary | ICD-10-CM | POA: Diagnosis not present

## 2015-03-07 DIAGNOSIS — Z8739 Personal history of other diseases of the musculoskeletal system and connective tissue: Secondary | ICD-10-CM | POA: Diagnosis not present

## 2015-03-07 DIAGNOSIS — F0280 Dementia in other diseases classified elsewhere without behavioral disturbance: Secondary | ICD-10-CM | POA: Insufficient documentation

## 2015-03-07 DIAGNOSIS — Z79899 Other long term (current) drug therapy: Secondary | ICD-10-CM | POA: Insufficient documentation

## 2015-03-07 DIAGNOSIS — R404 Transient alteration of awareness: Secondary | ICD-10-CM | POA: Diagnosis not present

## 2015-03-07 DIAGNOSIS — R531 Weakness: Secondary | ICD-10-CM | POA: Diagnosis not present

## 2015-03-07 DIAGNOSIS — M6281 Muscle weakness (generalized): Secondary | ICD-10-CM | POA: Insufficient documentation

## 2015-03-07 DIAGNOSIS — R42 Dizziness and giddiness: Secondary | ICD-10-CM | POA: Diagnosis not present

## 2015-03-07 DIAGNOSIS — Z7982 Long term (current) use of aspirin: Secondary | ICD-10-CM | POA: Diagnosis not present

## 2015-03-07 DIAGNOSIS — I2510 Atherosclerotic heart disease of native coronary artery without angina pectoris: Secondary | ICD-10-CM | POA: Insufficient documentation

## 2015-03-07 LAB — URINALYSIS, ROUTINE W REFLEX MICROSCOPIC
Bilirubin Urine: NEGATIVE
Glucose, UA: NEGATIVE mg/dL
Ketones, ur: NEGATIVE mg/dL
Leukocytes, UA: NEGATIVE
Nitrite: NEGATIVE
Protein, ur: 30 mg/dL — AB
Specific Gravity, Urine: 1.015 (ref 1.005–1.030)
Urobilinogen, UA: 0.2 mg/dL (ref 0.0–1.0)
pH: 7.5 (ref 5.0–8.0)

## 2015-03-07 LAB — CBC
HCT: 43.4 % (ref 36.0–46.0)
Hemoglobin: 14.1 g/dL (ref 12.0–15.0)
MCH: 29.1 pg (ref 26.0–34.0)
MCHC: 32.5 g/dL (ref 30.0–36.0)
MCV: 89.7 fL (ref 78.0–100.0)
Platelets: 174 10*3/uL (ref 150–400)
RBC: 4.84 MIL/uL (ref 3.87–5.11)
RDW: 14.3 % (ref 11.5–15.5)
WBC: 8.5 10*3/uL (ref 4.0–10.5)

## 2015-03-07 LAB — PROTIME-INR
INR: 0.97 (ref 0.00–1.49)
Prothrombin Time: 13 seconds (ref 11.6–15.2)

## 2015-03-07 LAB — URINE MICROSCOPIC-ADD ON

## 2015-03-07 LAB — BASIC METABOLIC PANEL
Anion gap: 11 (ref 5–15)
BUN: 13 mg/dL (ref 6–23)
CO2: 24 mmol/L (ref 19–32)
Calcium: 9.6 mg/dL (ref 8.4–10.5)
Chloride: 106 mmol/L (ref 96–112)
Creatinine, Ser: 0.75 mg/dL (ref 0.50–1.10)
GFR calc Af Amer: 87 mL/min — ABNORMAL LOW (ref >90)
GFR calc non Af Amer: 75 mL/min — ABNORMAL LOW (ref >90)
Glucose, Bld: 127 mg/dL — ABNORMAL HIGH (ref 70–99)
Potassium: 4 mmol/L (ref 3.5–5.1)
Sodium: 141 mmol/L (ref 135–145)

## 2015-03-07 LAB — CBG MONITORING, ED: Glucose-Capillary: 121 mg/dL — ABNORMAL HIGH (ref 70–99)

## 2015-03-07 LAB — APTT: aPTT: 25 seconds (ref 24–37)

## 2015-03-07 MED ORDER — TRAMADOL HCL 50 MG PO TABS
50.0000 mg | ORAL_TABLET | Freq: Once | ORAL | Status: AC
  Administered 2015-03-07: 50 mg via ORAL
  Filled 2015-03-07: qty 1

## 2015-03-07 MED ORDER — TRAMADOL HCL 50 MG PO TABS: 50.0000 mg | ORAL_TABLET | Freq: Four times a day (QID) | ORAL | Status: DC | PRN

## 2015-03-07 MED ORDER — ONDANSETRON HCL 4 MG/2ML IJ SOLN
4.0000 mg | Freq: Once | INTRAMUSCULAR | Status: AC
  Administered 2015-03-07: 4 mg via INTRAVENOUS
  Filled 2015-03-07: qty 2

## 2015-03-07 MED ORDER — DEXTROSE 5 % IV SOLN
1.0000 g | Freq: Once | INTRAVENOUS | Status: AC
  Administered 2015-03-07: 1 g via INTRAVENOUS
  Filled 2015-03-07: qty 10

## 2015-03-07 MED ORDER — CEPHALEXIN 500 MG PO CAPS: 500.0000 mg | ORAL_CAPSULE | Freq: Three times a day (TID) | ORAL | Status: DC

## 2015-03-07 MED ORDER — SODIUM CHLORIDE 0.9 % IV BOLUS (SEPSIS)
500.0000 mL | Freq: Once | INTRAVENOUS | Status: AC
  Administered 2015-03-07: 500 mL via INTRAVENOUS

## 2015-03-07 NOTE — Discharge Instructions (Signed)

## 2015-03-07 NOTE — ED Notes (Signed)
81 yo with c/o weakness/dizziness and back pain. Pt was headed to PCP for back pain appt when she experienced moderate generalized weakness. Unsteady gait with [facility] but ambulatory. Pt hx of early Alzheimer's. At baseline per daughter on seen. Vitals Stable.

## 2015-03-07 NOTE — ED Provider Notes (Signed)
CSN: [id number]     Arrival date & time 03/07/15  1145 History   First MD Initiated Contact with Patient 03/07/15 1455     Chief Complaint  Patient presents with  . Weakness  . Dizziness     (Consider location/radiation/quality/duration/timing/severity/associated sxs/prior Treatment) HPI   81 year old female brought in by daughter for evaluation of generalized weakness. Unclear onset but noticed over the past several days. Patient has no specific complaints aside from lower back pain. This is chronic in nature though. Denies acute change. No fevers or chills. No nausea or vomiting. No recent medication changes. No recent falls.  Past Medical History  Diagnosis Date  . CORONARY ARTERY DISEASE     a. 11/2000 Cath/PTCA: LM 20d, LAD 99p (PTCA), LCX 70p, RCA dom, 52m, EF 30 dist ant AK;  b. 2008 Myoview: EF 75%, small ant apical scar, mild peri-infarct ischemia.  [name] HYPERTENSION   . HYPERLIPIDEMIA   . DEPRESSION   . HERPES ZOSTER   . VERTIGO   . DIVERTICULOSIS, COLON   . OSTEOPOROSIS   . ANXIETY   . DEMENTIA   . History of Ischemic Cardiomyopathy     a. 11/2000 LV gram: EF 30%, dist ant AK;  b. 2008 MV EF 75%.  . Dementia   . Hx of echocardiogram     Echo (8/15):  EF 55%, mild apical HK, Gr 1 DD, mild AI, mild MR, severe LAE, mod RAE, mod PI, PASP 38 mmHg   Past Surgical History  Procedure Laterality Date  . Ptca  12/09/2000    successful; of the proximal left anterior descending with reduction of 99% narrowing to 30% with improvement of TIMI grade 1 to TIMI grade 3 flow  . Tonsillectomy and adenoidectomy  1943  . Left heart catheterization with coronary angiogram N/A 05/28/2014    Procedure: LEFT HEART CATHETERIZATION WITH CORONARY ANGIOGRAM;  Surgeon: Kathleene Hazel, MD;  Location: [facility] CATH LAB;  Service: Cardiovascular;  Laterality: N/A;  . Percutaneous coronary stent intervention (pci-s)  05/28/2014    Procedure: PERCUTANEOUS CORONARY STENT INTERVENTION (PCI-S);  Surgeon:  Kathleene Hazel, MD;  Location: [facility] CATH LAB;  Service: Cardiovascular;;   Family History  Problem Relation Age of Onset  . Hypertension Other   . Heart disease Other   . Coronary artery disease Other   . Diabetes Mother   . Heart attack Mother   . Hypertension Daughter   . Sudden death Mother    History  Substance Use Topics  . Smoking status: Never Smoker   . Smokeless tobacco: Never Used  . Alcohol Use: No   OB History    Gravida Para Term Preterm AB TAB SAB Ectopic Multiple Living   Review of Systems  All systems reviewed and negative, other than as noted in HPI.   Allergies  Simvastatin  Home Medications   Prior to Admission medications   Medication Sig Start Date End Date Taking? Authorizing Provider  acetaminophen (TYLENOL) 325 MG tablet Take 2 tablets (650 mg total) by mouth every 4 (four) hours as needed for headache or mild pain. 05/29/14  Yes Luke K Kilroy, PA-C  amLODipine (NORVASC) 5 MG tablet TAKE 1/2 TABLET BY MOUTH ONCE DAILY 08/16/14  Yes Aleksei Plotnikov V, MD  aspirin EC 81 MG EC tablet Take 1 tablet (81 mg total) by mouth daily. 05/29/14  Yes Luke K Kilroy, PA-C  atorvastatin (LIPITOR) 20 MG tablet  Take 1 tablet (20 mg total) by mouth daily. 12/19/14  Yes Aleksei Plotnikov V, MD  carvedilol (COREG) 12.5 MG tablet Take 1 tablet (12.5 mg total) by mouth 2 (two) times daily with a meal. 12/19/14  Yes Aleksei Plotnikov V, MD  Cholecalciferol ([employer]S VITAMIN D3) 1000 UNITS capsule Take 1 capsule (1,000 Units total) by mouth daily. 12/19/14  Yes Aleksei Plotnikov V, MD  clopidogrel (PLAVIX) 75 MG tablet Take 1 tablet (75 mg total) by mouth daily. Patient taking differently: Take 75 mg by mouth 2 (two) times daily.  02/19/15  Yes Kathleene Hazel, MD  lisinopril (PRINIVIL,ZESTRIL) 5 MG tablet Take 1 tablet (5 mg total) by mouth daily. 09/13/14  Yes Aleksei Plotnikov V, MD  Memantine HCl-Donepezil HCl (NAMZARIC) 28-10 MG CP24 Take 1 capsule  by mouth daily. 12/19/14  Yes Aleksei Plotnikov V, MD  mirtazapine (REMERON) 15 MG tablet TAKE 1 TABLET BY MOUTH AT BEDTIME BETWEEN 6-7 PM 11/17/14  Yes Aleksei Plotnikov V, MD  nitroGLYCERIN (NITROSTAT) 0.4 MG SL tablet Place 1 tablet (0.4 mg total) under the tongue every 5 (five) minutes as needed for chest pain. 05/29/14  Yes Abelino Derrick, PA-C  ticagrelor (BRILINTA) 90 MG TABS tablet Take 90 mg by mouth 2 (two) times daily.   Yes Historical Provider, MD   BP 138/64 mmHg  Pulse 64  Temp(Src) 97.9 F (36.6 C) (Oral)  Resp 18  SpO2 100% Physical Exam  Constitutional: She appears well-developed and well-nourished. No distress.  HENT:  Head: Normocephalic and atraumatic.  Eyes: Conjunctivae are normal. Pupils are equal, round, and reactive to light. Right eye exhibits no discharge. Left eye exhibits no discharge.  Neck: Neck supple.  Cardiovascular: Normal rate, regular rhythm and normal heart sounds.  Exam reveals no gallop and no friction rub.   No murmur heard. Pulmonary/Chest: Effort normal and breath sounds normal. No respiratory distress.  Abdominal: Soft. She exhibits no distension. There is no tenderness.  Musculoskeletal: She exhibits no edema or tenderness.  No midline spinal tenderness. Strength is 5 out of 5 bilateral lower extremities. Sensation is intact to light touch.  Neurological: She is alert. No cranial nerve deficit. She exhibits normal muscle tone. Coordination normal.  Skin: Skin is warm and dry.  Psychiatric: She has a normal mood and affect. Her behavior is normal. Thought content normal.  Nursing note and vitals reviewed.   ED Course  Procedures (including critical care time) Labs Review Labs Reviewed  BASIC METABOLIC PANEL - Abnormal; Notable for the following:    Glucose, Bld 127 (*)    GFR calc non Af Amer 75 (*)    GFR calc Af Amer 87 (*)    All other components within normal limits  URINALYSIS, ROUTINE W REFLEX MICROSCOPIC - Abnormal; Notable for the  following:    APPearance CLOUDY (*)    Hgb urine dipstick LARGE (*)    Protein, ur 30 (*)    All other components within normal limits  URINE MICROSCOPIC-ADD ON - Abnormal; Notable for the following:    Bacteria, UA MANY (*)    All other components within normal limits  CBG MONITORING, ED - Abnormal; Notable for the following:    Glucose-Capillary 121 (*)    All other components within normal limits  CBC  PROTIME-INR  APTT    Imaging Review No results found.   EKG Interpretation   Date/Time:  Thursday March 07 2015 11:51:23 EDT Ventricular Rate:  75 PR Interval:  161 QRS Duration: 91  QT Interval:  412 QTC Calculation: 460 R Axis:   68 Text Interpretation:  Sinus rhythm Borderline repol abnormality, diffuse  leads Confirmed by Juleen ChinaKOHUT  MD, Mathhew Buysse ([id number]) on 03/07/2015 2:57:01 PM      MDM   Final diagnoses:  Generalized weakness  UTI (lower urinary tract infection)  Midline low back pain without sciatica    87yF with generalized weakness, some changes in mentation and lower back pain. W/u significant for UTI. Many bacteria with rare squamous cells. Hematuria. Suspect this accounts for recent behavior. Urine culture. Rocephin. Lower back pain is more chronic and likely separate process. No concerning red flags. Discussed inpatient versus out patient treatment. She is afebrile. HD stable. I feel either way is reasonable. Pt and family comfortable with discharge. Continued abx. Not getting much pan control with tylenol. PRN tramadol.     Raeford RazorStephen Celsey Asselin, MD 03/13/15 1556

## 2015-03-07 NOTE — ED Notes (Signed)
Bed: [id number] Expected date:  Expected time:  Means of arrival:  Comments: EMS- 81yo F, dizziness/weakness

## 2015-03-10 LAB — URINE CULTURE: Colony Count: >=100000

## 2015-03-11 ENCOUNTER — Telehealth (HOSPITAL_COMMUNITY): Payer: Self-pay

## 2015-03-11 ENCOUNTER — Telehealth (HOSPITAL_COMMUNITY): Payer: Self-pay | Admitting: *Deleted

## 2015-03-11 NOTE — Telephone Encounter (Signed)
Post ED Visit - Positive Culture Follow-up  Culture report reviewed by antimicrobial stewardship pharmacist: []  Wes Dulaney, Pharm.D., BCPS []  Celedonio MiyamotoJeremy Frens, Pharm.D., BCPS [x]  Georgina PillionElizabeth Martin, Pharm.D., BCPS []  RandolphMinh Pham, [address]Ph[address]D., BCPS, AAHIVP []  Estella HuskMichelle Turner, Pharm.D., BCPS, AAHIVP []  Elder CyphersLorie Poole, [address]Pharm[address]D., BCPS  Positive Urine culture, >/= 100,000 colonies -> Klebsiella Pneumoniae Treated with Cephalexin, organism sensitive to the same and no further patient follow-up is required at this time.  Arvid RightClark, Tiffay Pinette Dorn 03/11/2015, 5:30 AM

## 2015-03-12 IMAGING — CR DG LUMBAR SPINE 2-3V
3 series · 3 of 3 positions shown · non-contrast
Comparison: 09/03/2011.

CLINICAL DATA: Low back pain.  Rule out compression fracture.  No
injury.

LUMBAR SPINE - 2-3 VIEW

[view not recorded (1 of 3)]
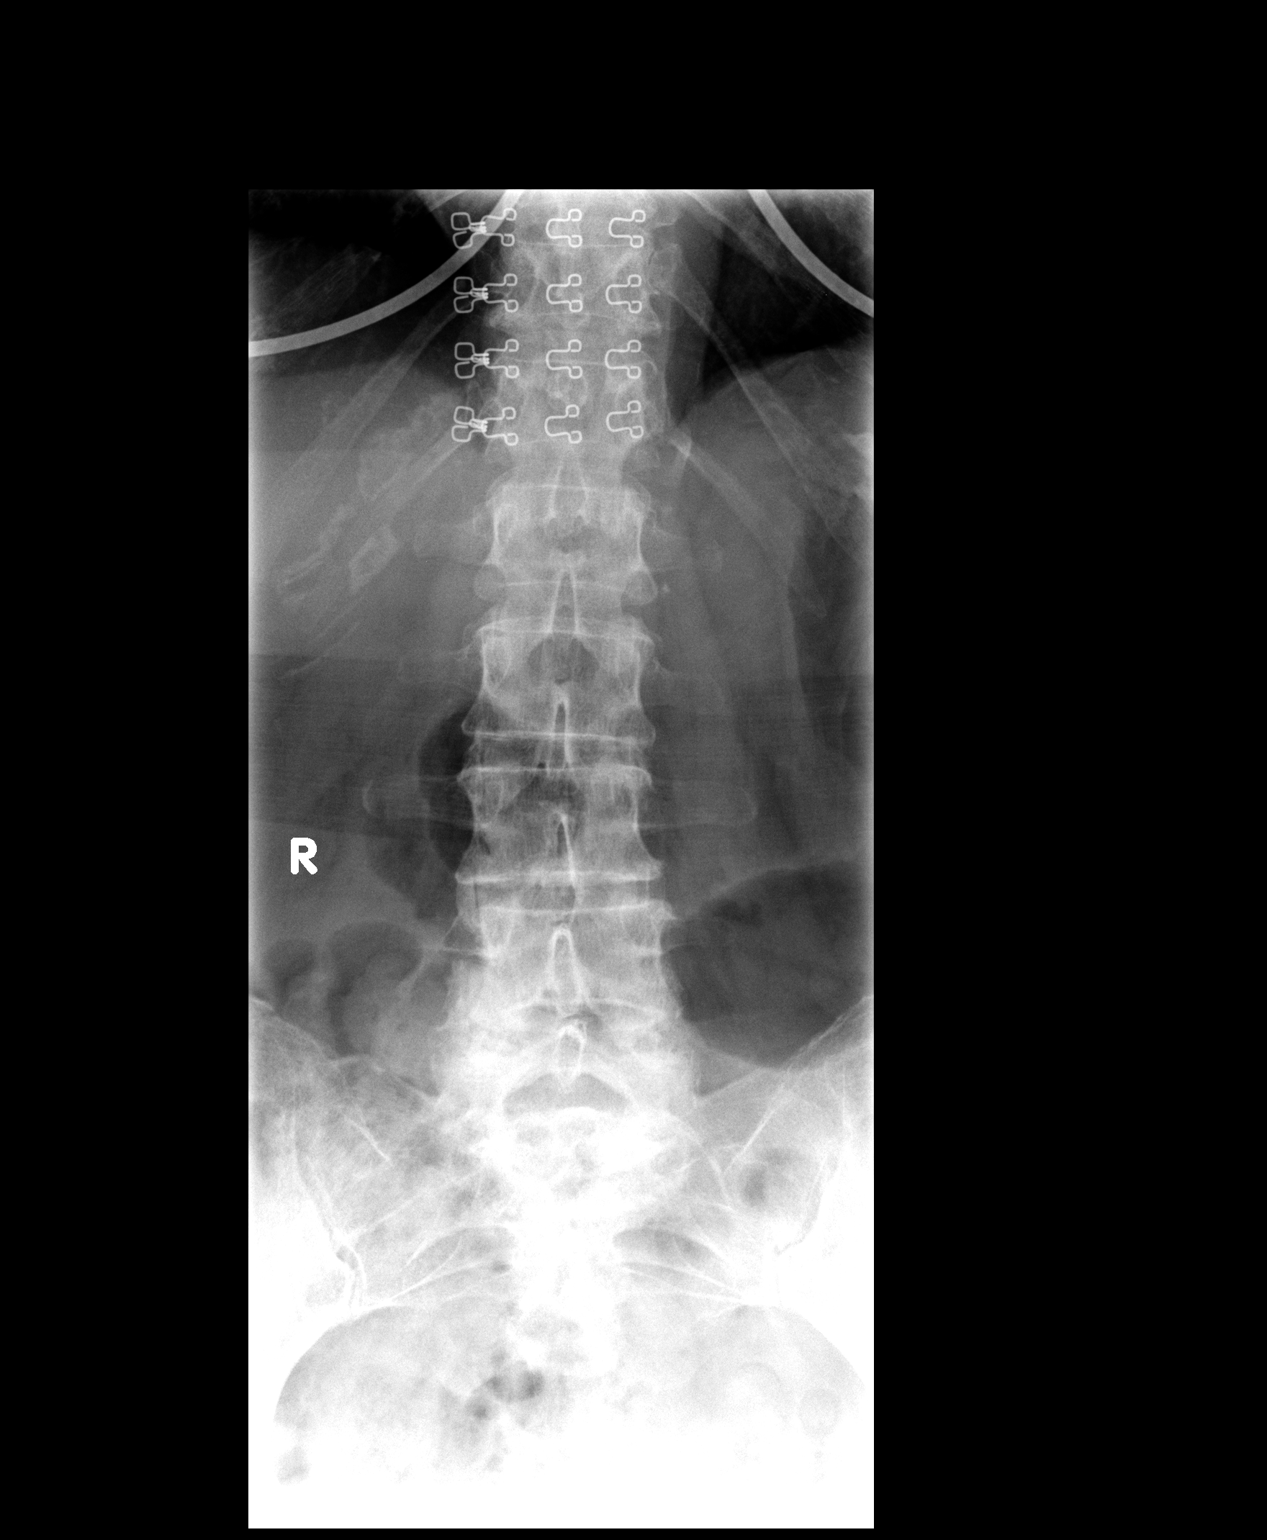

[view not recorded (2 of 3)]
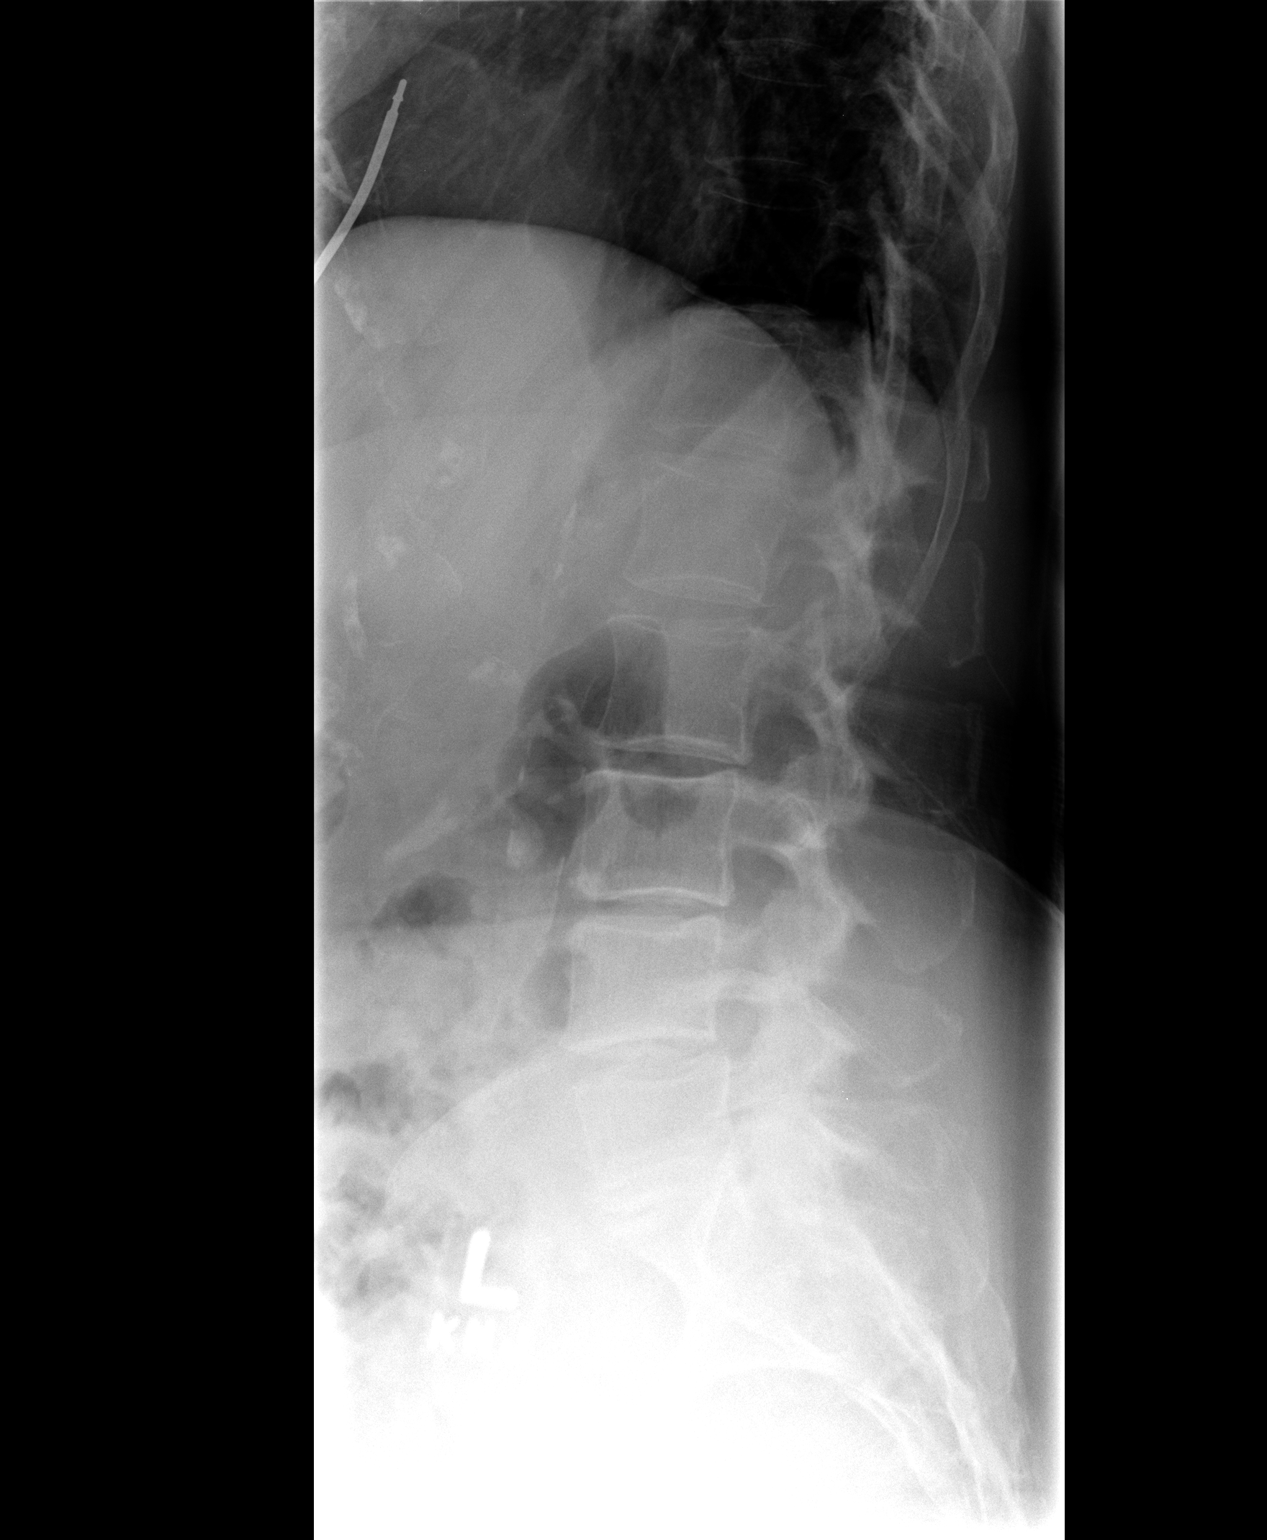

[view not recorded (3 of 3)]
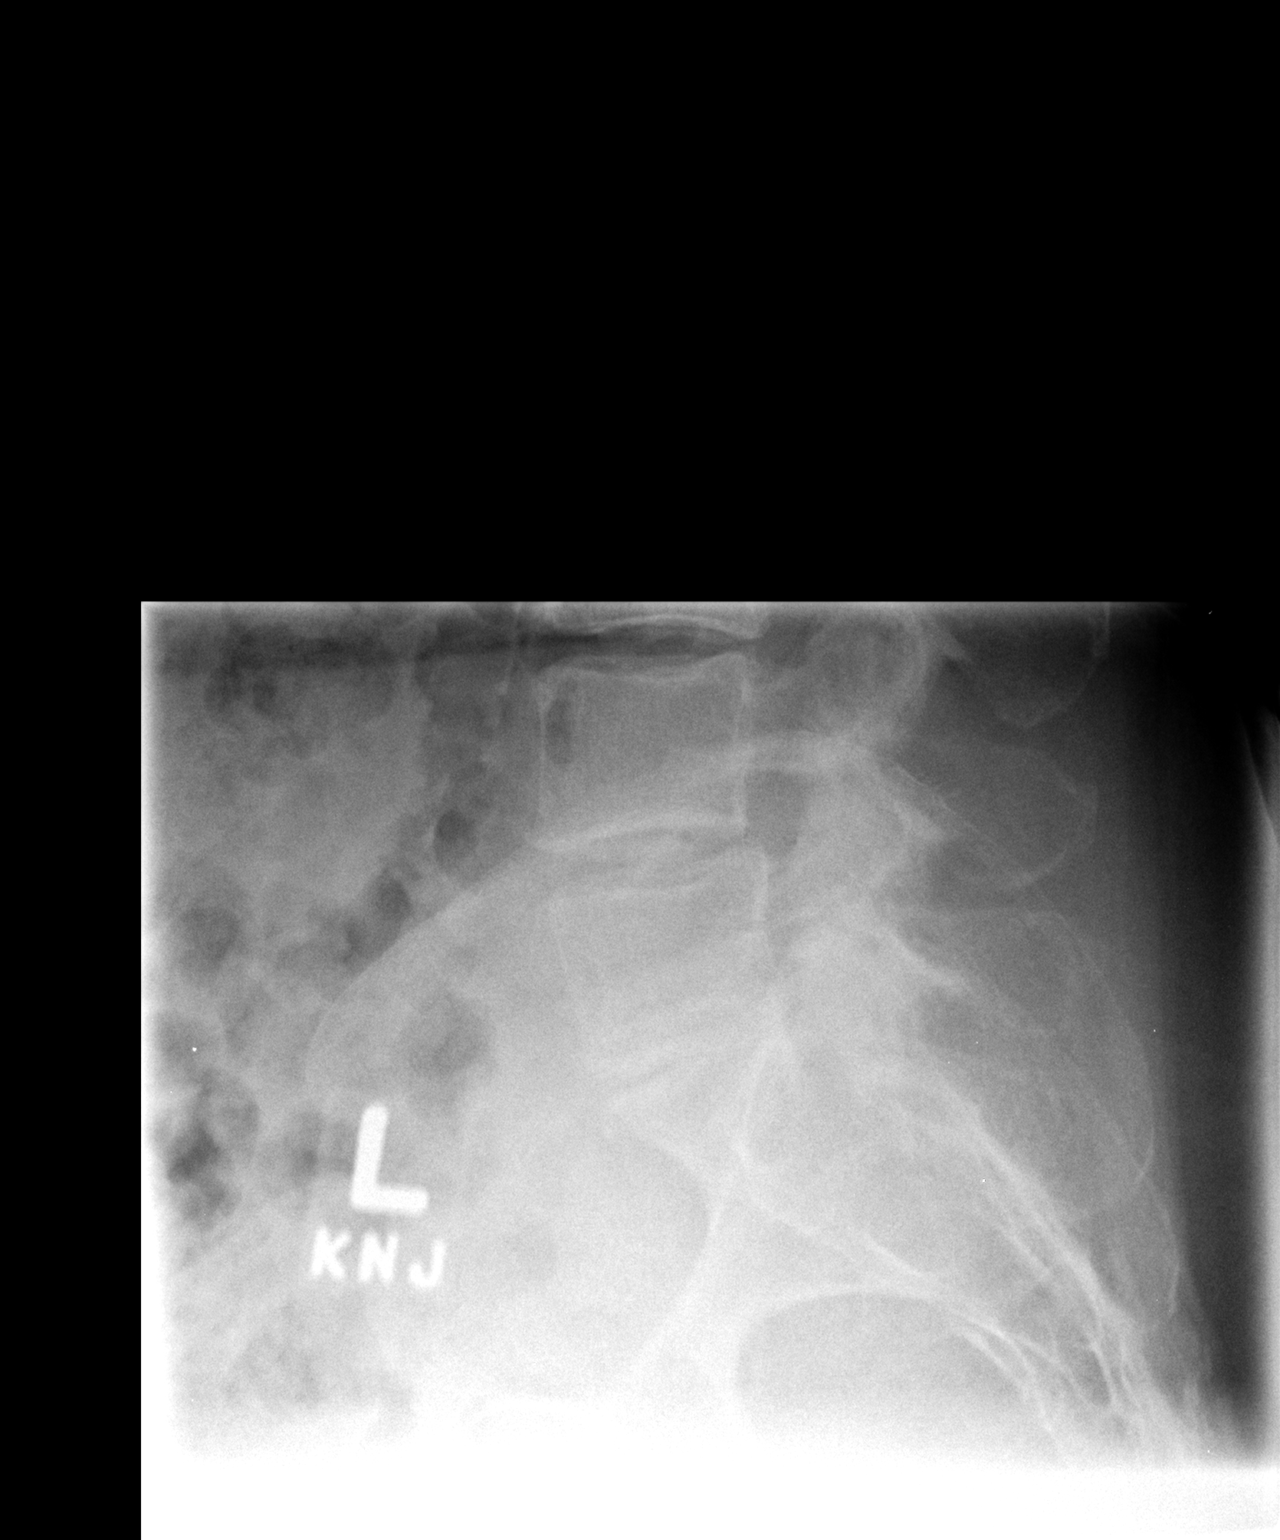

[3 of 3 positions shown; findings below may reference images not displayed]

FINDINGS: No acute fracture noted.

Very mild L3-4 and L4-5 disc space narrowing.  Mild to moderate L5-
S1 disc space narrowing.

Facet joint degenerative changes most notable lower lumbar region.

Vascular calcifications.
IMPRESSION: No acute fracture noted.

Mild degenerative changes lower lumbar spine as detailed above.

## 2015-03-13 ENCOUNTER — Encounter: Payer: Self-pay | Admitting: Internal Medicine

## 2015-03-13 ENCOUNTER — Ambulatory Visit (INDEPENDENT_AMBULATORY_CARE_PROVIDER_SITE_OTHER)
Admission: RE | Admit: 2015-03-13 | Discharge: 2015-03-13 | Disposition: A | Discharge status: Home / Self Care | Payer: Medicare Other | Source: Ambulatory Visit | Attending: Internal Medicine | Admitting: Internal Medicine

## 2015-03-13 ENCOUNTER — Ambulatory Visit (INDEPENDENT_AMBULATORY_CARE_PROVIDER_SITE_OTHER): Payer: Medicare Other | Admitting: Internal Medicine

## 2015-03-13 ENCOUNTER — Other Ambulatory Visit (INDEPENDENT_AMBULATORY_CARE_PROVIDER_SITE_OTHER): Payer: Medicare Other

## 2015-03-13 VITALS — BP 160/86 | HR 62 | Temp 97.7°F | Wt 133.0 lb

## 2015-03-13 DIAGNOSIS — M545 Low back pain, unspecified: Secondary | ICD-10-CM | POA: Insufficient documentation

## 2015-03-13 DIAGNOSIS — I25119 Atherosclerotic heart disease of native coronary artery with unspecified angina pectoris: Secondary | ICD-10-CM

## 2015-03-13 DIAGNOSIS — I517 Cardiomegaly: Secondary | ICD-10-CM | POA: Diagnosis not present

## 2015-03-13 DIAGNOSIS — M47816 Spondylosis without myelopathy or radiculopathy, lumbar region: Secondary | ICD-10-CM | POA: Diagnosis not present

## 2015-03-13 DIAGNOSIS — R5382 Chronic fatigue, unspecified: Secondary | ICD-10-CM | POA: Diagnosis not present

## 2015-03-13 DIAGNOSIS — R6883 Chills (without fever): Secondary | ICD-10-CM

## 2015-03-13 DIAGNOSIS — Z8701 Personal history of pneumonia (recurrent): Secondary | ICD-10-CM | POA: Diagnosis not present

## 2015-03-13 DIAGNOSIS — I700 Atherosclerosis of aorta: Secondary | ICD-10-CM | POA: Diagnosis not present

## 2015-03-13 DIAGNOSIS — G309 Alzheimer's disease, unspecified: Secondary | ICD-10-CM

## 2015-03-13 DIAGNOSIS — F0280 Dementia in other diseases classified elsewhere without behavioral disturbance: Secondary | ICD-10-CM

## 2015-03-13 DIAGNOSIS — M47817 Spondylosis without myelopathy or radiculopathy, lumbosacral region: Secondary | ICD-10-CM | POA: Diagnosis not present

## 2015-03-13 LAB — CBC WITH DIFFERENTIAL/PLATELET
Basophils Absolute: 0 10*3/uL (ref 0.0–0.1)
Basophils Relative: 0.3 % (ref 0.0–3.0)
Eosinophils Absolute: 0.1 10*3/uL (ref 0.0–0.7)
Eosinophils Relative: 1.3 % (ref 0.0–5.0)
HCT: 45.1 % (ref 36.0–46.0)
Hemoglobin: 15 g/dL (ref 12.0–15.0)
LYMPHS ABS: 1.7 10*3/uL (ref 0.7–4.0)
Lymphocytes Relative: 18.9 % (ref 12.0–46.0)
MCHC: 33.3 g/dL (ref 30.0–36.0)
MCV: 86.8 fl (ref 78.0–100.0)
MONOS PCT: 10.2 % (ref 3.0–12.0)
Monocytes Absolute: 0.9 10*3/uL (ref 0.1–1.0)
NEUTROS PCT: 69.3 % (ref 43.0–77.0)
Neutro Abs: 6.1 10*3/uL (ref 1.4–7.7)
PLATELETS: 224 10*3/uL (ref 150.0–400.0)
RBC: 5.2 Mil/uL — AB (ref 3.87–5.11)
RDW: 15.3 % (ref 11.5–15.5)
WBC: 8.8 10*3/uL (ref 4.0–10.5)

## 2015-03-13 LAB — BASIC METABOLIC PANEL
BUN: 21 mg/dL (ref 6–23)
CO2: 26 meq/L (ref 19–32)
CREATININE: 0.92 mg/dL (ref 0.40–1.20)
Calcium: 10 mg/dL (ref 8.4–10.5)
Chloride: 102 mEq/L (ref 96–112)
GFR: 61.58 mL/min (ref >60.00)
GLUCOSE: 100 mg/dL — AB (ref 70–99)
POTASSIUM: 4.3 meq/L (ref 3.5–5.1)
Sodium: 137 mEq/L (ref 135–145)

## 2015-03-13 LAB — HEPATIC FUNCTION PANEL
ALT: 16 U/L (ref 0–35)
AST: 19 U/L (ref 0–37)
Albumin: 4.4 g/dL (ref 3.5–5.2)
Alkaline Phosphatase: 68 U/L (ref 39–117)
BILIRUBIN TOTAL: 0.6 mg/dL (ref 0.2–1.2)
Bilirubin, Direct: 0.1 mg/dL (ref 0.0–0.3)
Total Protein: 8 g/dL (ref 6.0–8.3)

## 2015-03-13 LAB — URINALYSIS, ROUTINE W REFLEX MICROSCOPIC
Bilirubin Urine: NEGATIVE
KETONES UR: NEGATIVE
Leukocytes, UA: NEGATIVE
Nitrite: NEGATIVE
PH: 6 (ref 5.0–8.0)
Specific Gravity, Urine: <=1.005 — AB (ref 1.000–1.030)
Total Protein, Urine: NEGATIVE
URINE GLUCOSE: NEGATIVE
Urobilinogen, UA: 0.2 (ref 0.0–1.0)

## 2015-03-13 LAB — TSH: TSH: 3.44 u[IU]/mL (ref 0.35–4.50)

## 2015-03-13 MED ORDER — CEFUROXIME AXETIL 250 MG PO TABS: 250.0000 mg | ORAL_TABLET | Freq: Two times a day (BID) | ORAL | Status: DC

## 2015-03-13 NOTE — Assessment & Plan Note (Signed)
D/c Remeron due to fatigue

## 2015-03-13 NOTE — Progress Notes (Signed)
Pre visit review using our clinic review tool, if applicable. No additional management support is needed unless otherwise documented below in the visit note. 

## 2015-03-13 NOTE — Assessment & Plan Note (Signed)
Chronic, worse now 4.16 X ray

## 2015-03-13 NOTE — Progress Notes (Signed)
   Subjective:      HPI  Hx - ER visit 03/07/15: "87yF with generalized weakness, some changes in mentation and lower back pain. W/u significant for UTI. Many bacteria with rare squamous cells. Hematuria. Suspect this accounts for recent behavior. Urine culture. Rocephin. Lower back pain is more chronic and likely separate process. No concerning red flags. Discussed inpatient versus out patient treatment. She is afebrile. HD stable. I feel either way is reasonable. Pt and family comfortable with discharge. Continued abx. Not getting much pan control with tylenol. PRN tramadol. "  C/o LBP, fatigue  F/u UTI - Klebsiella - had Keflex  The patient is here to follow up on chronic dementia - worse, grief/depression, anxiety,CAD symptoms controlled with medicines   Wt Readings from Last 3 Encounters:  03/13/15 133 lb (60.328 kg)  02/19/15 132 lb 6.4 oz (60.056 kg)  12/19/14 133 lb (60.328 kg)   BP Readings from Last 3 Encounters:  03/13/15 160/86  03/07/15 157/73  02/19/15 136/60       Review of Systems  Constitutional: Positive for chills and fatigue. Negative for fever, activity change, appetite change and unexpected weight change.  HENT: Negative for mouth sores and sinus pressure.   Eyes: Negative for visual disturbance.  Respiratory: Negative for chest tightness.   Gastrointestinal: Negative for nausea.  Genitourinary: Negative for frequency, difficulty urinating and vaginal pain.  Musculoskeletal: Negative for gait problem.  Skin: Negative for pallor.  Neurological: Negative for dizziness, tremors and headaches.  Hematological: Does not bruise/bleed easily.  Psychiatric/Behavioral: Positive for decreased concentration. Negative for suicidal ideas, confusion, sleep disturbance and agitation. The patient is not nervous/anxious.        Objective:   Physical Exam  Constitutional: She appears well-developed. No distress.  HENT:  Head: Normocephalic.  Right Ear: External ear  normal.  Left Ear: External ear normal.  Nose: Nose normal.  Mouth/Throat: Oropharynx is clear and moist.  Eyes: Conjunctivae are normal. Pupils are equal, round, and reactive to light. Right eye exhibits no discharge. Left eye exhibits no discharge.  Neck: Normal range of motion. Neck supple. No JVD present. No tracheal deviation present. No thyromegaly present.  Cardiovascular: Normal rate, regular rhythm and normal heart sounds.   Pulmonary/Chest: No stridor. No respiratory distress. She has no wheezes.  Abdominal: Soft. Bowel sounds are normal. She exhibits no distension and no mass. There is no tenderness. There is no rebound and no guarding.  Musculoskeletal: She exhibits no edema or tenderness.  Lymphadenopathy:    She has no cervical adenopathy.  Neurological: She displays normal reflexes. No cranial nerve deficit. She exhibits normal muscle tone. Coordination normal.  Skin: No rash noted. No erythema.  Psychiatric:     a/c   Lab Results  Component Value Date   WBC 8.5 03/07/2015   HGB 14.1 03/07/2015   HCT 43.4 03/07/2015   PLT 174 03/07/2015   GLUCOSE 127* 03/07/2015   CHOL 119 07/26/2014   TRIG 129.0 07/26/2014   HDL 56.70 07/26/2014   LDLCALC 37 07/26/2014   ALT 14 07/26/2014   AST 20 07/26/2014   NA 141 03/07/2015   K 4.0 03/07/2015   CL 106 03/07/2015   CREATININE 0.75 03/07/2015   BUN 13 03/07/2015   CO2 24 03/07/2015   TSH 1.890 05/26/2014   INR 0.97 03/07/2015        Assessment & Plan:

## 2015-03-13 NOTE — Assessment & Plan Note (Signed)
Aricept and Namenda 

## 2015-03-13 NOTE — Assessment & Plan Note (Signed)
?  etiology D/c Remeron Hold Lipitor CXR, LS xray Labs, UA To ER if worse

## 2015-03-13 NOTE — Patient Instructions (Addendum)
Stop Mirtazapine Hold Lipitor Tylenol as needed  To ER if worse

## 2015-03-20 ENCOUNTER — Ambulatory Visit (INDEPENDENT_AMBULATORY_CARE_PROVIDER_SITE_OTHER): Payer: Medicare Other | Admitting: Internal Medicine

## 2015-03-20 ENCOUNTER — Encounter: Payer: Self-pay | Admitting: Internal Medicine

## 2015-03-20 VITALS — BP 166/82 | HR 63 | Temp 97.6°F | Resp 18 | Wt 131.0 lb

## 2015-03-20 DIAGNOSIS — R5383 Other fatigue: Secondary | ICD-10-CM

## 2015-03-20 DIAGNOSIS — R413 Other amnesia: Secondary | ICD-10-CM | POA: Diagnosis not present

## 2015-03-20 DIAGNOSIS — I10 Essential (primary) hypertension: Secondary | ICD-10-CM | POA: Diagnosis not present

## 2015-03-20 MED ORDER — PHOSPHATIDYLSERINE-DHA-EPA 100-19.5-6.5 MG PO CAPS: 1.0000 | ORAL_CAPSULE | Freq: Every day | ORAL | Status: DC

## 2015-03-20 NOTE — Assessment & Plan Note (Addendum)
4/16 worse - poss mini-CVA Discussed  Added Vayacog Neurology ref offered - declined at the moment

## 2015-03-20 NOTE — Assessment & Plan Note (Signed)
May need to get help at home

## 2015-03-20 NOTE — Assessment & Plan Note (Signed)
On Lisinopril 

## 2015-03-20 NOTE — Progress Notes (Signed)
Pre visit review using our clinic review tool, if applicable. No additional management support is needed unless otherwise documented below in the visit note. 

## 2015-03-20 NOTE — Progress Notes (Signed)
   Subjective:      HPI  C/o recent worsening of memory, weakness, dizziness. Not better after the abx. She is here w/[name].   ER visit 03/07/15: "87yF with generalized weakness, some changes in mentation and lower back pain. W/u significant for UTI. "  C/o LBP, fatigue  F/u UTI - Klebsiella - pt had Keflex  The patient is here to follow up on chronic dementia - worse, grief/depression, anxiety,CAD symptoms controlled with medicines   Wt Readings from Last 3 Encounters:  03/20/15 131 lb (59.421 kg)  03/13/15 133 lb (60.328 kg)  02/19/15 132 lb 6.4 oz (60.056 kg)   BP Readings from Last 3 Encounters:  03/20/15 166/82  03/13/15 160/86  03/07/15 157/73       Review of Systems  Constitutional: Positive for chills and fatigue. Negative for fever, activity change, appetite change and unexpected weight change.  HENT: Negative for mouth sores and sinus pressure.   Eyes: Negative for visual disturbance.  Respiratory: Negative for chest tightness.   Gastrointestinal: Negative for nausea.  Genitourinary: Negative for frequency, difficulty urinating and vaginal pain.  Musculoskeletal: Negative for gait problem.  Skin: Negative for pallor.  Neurological: Negative for dizziness, tremors and headaches.  Hematological: Does not bruise/bleed easily.  Psychiatric/Behavioral: Positive for decreased concentration. Negative for suicidal ideas, confusion, sleep disturbance and agitation. The patient is not nervous/anxious.        Objective:   Physical Exam  Constitutional: She appears well-developed. No distress.  HENT:  Head: Normocephalic.  Right Ear: External ear normal.  Left Ear: External ear normal.  Nose: Nose normal.  Mouth/Throat: Oropharynx is clear and moist.  Eyes: Conjunctivae are normal. Pupils are equal, round, and reactive to light. Right eye exhibits no discharge. Left eye exhibits no discharge.  Neck: Normal range of motion. Neck supple. No JVD present. No tracheal  deviation present. No thyromegaly present.  Cardiovascular: Normal rate, regular rhythm and normal heart sounds.   Pulmonary/Chest: No stridor. No respiratory distress. She has no wheezes.  Abdominal: Soft. Bowel sounds are normal. She exhibits no distension and no mass. There is no tenderness. There is no rebound and no guarding.  Musculoskeletal: She exhibits no edema or tenderness.  Lymphadenopathy:    She has no cervical adenopathy.  Neurological: She displays normal reflexes. No cranial nerve deficit. She exhibits normal muscle tone. Coordination normal.  Skin: No rash noted. No erythema.  Psychiatric:     a/c  Ataxic, seems worse. L pronator drift, stagering to L   Lab Results  Component Value Date   WBC 8.8 03/13/2015   HGB 15.0 03/13/2015   HCT 45.1 03/13/2015   PLT 224.0 03/13/2015   GLUCOSE 100* 03/13/2015   CHOL 119 07/26/2014   TRIG 129.0 07/26/2014   HDL 56.70 07/26/2014   LDLCALC 37 07/26/2014   ALT 16 03/13/2015   AST 19 03/13/2015   NA 137 03/13/2015   K 4.3 03/13/2015   CL 102 03/13/2015   CREATININE 0.92 03/13/2015   BUN 21 03/13/2015   CO2 26 03/13/2015   TSH 3.44 03/13/2015   INR 0.97 03/07/2015        Assessment & Plan:

## 2015-04-01 ENCOUNTER — Encounter: Payer: Self-pay | Admitting: Internal Medicine

## 2015-04-03 ENCOUNTER — Encounter: Payer: Self-pay | Admitting: Internal Medicine

## 2015-04-05 NOTE — Telephone Encounter (Signed)
Letter written and daughter has been notified for pick-up...Raechel Chute/lmb

## 2015-04-05 NOTE — Telephone Encounter (Signed)
Called daughter Zella Ball(robin) inform her md ok to write letter needing to know what she need the letter to say. Daughter states just stating he is mom MD and she is declining in health and shouldn't be left alone. Generated letter inform she can pick up today...Raechel Chute/lmb

## 2015-04-08 ENCOUNTER — Ambulatory Visit: Payer: Medicare Other | Admitting: Internal Medicine

## 2015-06-26 ENCOUNTER — Other Ambulatory Visit: Payer: Self-pay | Admitting: Internal Medicine

## 2015-08-14 ENCOUNTER — Other Ambulatory Visit (INDEPENDENT_AMBULATORY_CARE_PROVIDER_SITE_OTHER): Payer: Medicare Other

## 2015-08-14 ENCOUNTER — Encounter: Payer: Self-pay | Admitting: Internal Medicine

## 2015-08-14 ENCOUNTER — Ambulatory Visit (INDEPENDENT_AMBULATORY_CARE_PROVIDER_SITE_OTHER): Payer: Medicare Other | Admitting: Internal Medicine

## 2015-08-14 VITALS — BP 138/80 | HR 89 | Wt 129.0 lb

## 2015-08-14 DIAGNOSIS — Z23 Encounter for immunization: Secondary | ICD-10-CM | POA: Diagnosis not present

## 2015-08-14 DIAGNOSIS — G309 Alzheimer's disease, unspecified: Secondary | ICD-10-CM

## 2015-08-14 DIAGNOSIS — I2510 Atherosclerotic heart disease of native coronary artery without angina pectoris: Secondary | ICD-10-CM

## 2015-08-14 DIAGNOSIS — I10 Essential (primary) hypertension: Secondary | ICD-10-CM

## 2015-08-14 DIAGNOSIS — F0280 Dementia in other diseases classified elsewhere without behavioral disturbance: Secondary | ICD-10-CM

## 2015-08-14 LAB — BASIC METABOLIC PANEL
BUN: 26 mg/dL — AB (ref 6–23)
CO2: 25 mEq/L (ref 19–32)
CREATININE: 1.07 mg/dL (ref 0.40–1.20)
Calcium: 9.9 mg/dL (ref 8.4–10.5)
Chloride: 105 mEq/L (ref 96–112)
GFR: 51.68 mL/min — ABNORMAL LOW (ref >60.00)
Glucose, Bld: 101 mg/dL — ABNORMAL HIGH (ref 70–99)
Potassium: 4.4 mEq/L (ref 3.5–5.1)
Sodium: 140 mEq/L (ref 135–145)

## 2015-08-14 NOTE — Progress Notes (Signed)
Subjective:  Patient ID: [name], female    DOB: 03/24/1927  Age: 81 y.o. MRN: [id number]  CC: No chief complaint on file.   HPI [name] [name] presents for dementia, HTN, LBP f/u  Outpatient Prescriptions Prior to Visit  Medication Sig Dispense Refill  . acetaminophen (TYLENOL) 325 MG tablet Take 2 tablets (650 mg total) by mouth every 4 (four) hours as needed for headache or mild pain.    [name] amLODipine (NORVASC) 5 MG tablet TAKE 1/2 TABLET BY MOUTH ONCE DAILY 30 tablet 5  . aspirin EC 81 MG EC tablet Take 1 tablet (81 mg total) by mouth daily.    [name] atorvastatin (LIPITOR) 20 MG tablet Take 1 tablet (20 mg total) by mouth daily. 30 tablet 11  . BYSTOLIC 10 MG tablet daily.  10  . carvedilol (COREG) 12.5 MG tablet Take 1 tablet (12.5 mg total) by mouth 2 (two) times daily with a meal. 60 tablet 11  . Cholecalciferol ([employer] VITAMIN D3) 1000 UNITS capsule Take 1 capsule (1,000 Units total) by mouth daily. 100 capsule 3  . clopidogrel (PLAVIX) 75 MG tablet Take 1 tablet (75 mg total) by mouth daily. (Patient taking differently: Take 75 mg by mouth 2 (two) times daily. ) 90 tablet 3  . lisinopril (PRINIVIL,ZESTRIL) 5 MG tablet Take 1 tablet (5 mg total) by mouth daily. 90 tablet 3  . Memantine HCl-Donepezil HCl (NAMZARIC) 28-10 MG CP24 Take 1 capsule by mouth daily. 30 capsule 11  . mirtazapine (REMERON) 15 MG tablet TAKE 1 TABLET BY MOUTH EVERY NIGHT AT BEDTIME BETWEEN 6-7 PM 90 tablet 3  . nitroGLYCERIN (NITROSTAT) 0.4 MG SL tablet Place 1 tablet (0.4 mg total) under the tongue every 5 (five) minutes as needed for chest pain. 25 tablet 2  . CRESTOR 10 MG tablet daily.  7  . ticagrelor (BRILINTA) 90 MG TABS tablet Take 90 mg by mouth 2 (two) times daily.    . Phosphatidylserine-DHA-EPA (VAYACOG) 100-19.5-6.5 MG CAPS Take 1 capsule by mouth daily. (Patient not taking: Reported on 08/14/2015) 30 capsule 11   No facility-administered medications prior to visit.    ROS Review of Systems    Constitutional: Negative for chills, activity change, appetite change, fatigue and unexpected weight change.  HENT: Negative for congestion, mouth sores and sinus pressure.   Eyes: Negative for visual disturbance.  Respiratory: Negative for cough and chest tightness.   Gastrointestinal: Negative for nausea and abdominal pain.  Genitourinary: Negative for frequency, difficulty urinating and vaginal pain.  Musculoskeletal: Positive for back pain. Negative for gait problem.  Skin: Negative for pallor and rash.  Neurological: Negative for dizziness, tremors, weakness, numbness and headaches.  Psychiatric/Behavioral: Positive for confusion and decreased concentration. Negative for suicidal ideas and sleep disturbance. The patient is not nervous/anxious.     Objective:  BP 138/80 mmHg  Pulse 89  Wt 129 lb (58.514 kg)  SpO2 96%  BP Readings from Last 3 Encounters:  08/14/15 138/80  03/20/15 166/82  03/13/15 160/86    Wt Readings from Last 3 Encounters:  08/14/15 129 lb (58.514 kg)  03/20/15 131 lb (59.421 kg)  03/13/15 133 lb (60.328 kg)    Physical Exam  Constitutional: She appears well-developed. No distress.  HENT:  Head: Normocephalic.  Right Ear: External ear normal.  Left Ear: External ear normal.  Nose: Nose normal.  Mouth/Throat: Oropharynx is clear and moist.  Eyes: Conjunctivae are normal. Pupils are equal, round, and reactive to light. Right eye exhibits  no discharge. Left eye exhibits no discharge.  Neck: Normal range of motion. Neck supple. No JVD present. No tracheal deviation present. No thyromegaly present.  Cardiovascular: Normal rate, regular rhythm and normal heart sounds.   Pulmonary/Chest: No stridor. No respiratory distress. She has no wheezes.  Abdominal: Soft. Bowel sounds are normal. She exhibits no distension and no mass. There is no tenderness. There is no rebound and no guarding.  Musculoskeletal: She exhibits tenderness. She exhibits no edema.   Lymphadenopathy:    She has no cervical adenopathy.  Neurological: She displays normal reflexes. No cranial nerve deficit. She exhibits normal muscle tone. Coordination normal.  Skin: No rash noted. No erythema.  Psychiatric: She has a normal mood and affect. Her behavior is normal.  LS tender Alert, cooperative  Lab Results  Component Value Date   WBC 8.8 03/13/2015   HGB 15.0 03/13/2015   HCT 45.1 03/13/2015   PLT 224.0 03/13/2015   GLUCOSE 100* 03/13/2015   CHOL 119 07/26/2014   TRIG 129.0 07/26/2014   HDL 56.70 07/26/2014   LDLCALC 37 07/26/2014   ALT 16 03/13/2015   AST 19 03/13/2015   NA 137 03/13/2015   K 4.3 03/13/2015   CL 102 03/13/2015   CREATININE 0.92 03/13/2015   BUN 21 03/13/2015   CO2 26 03/13/2015   TSH 3.44 03/13/2015   INR 0.97 03/07/2015    Dg Chest 2 View  03/13/2015   CLINICAL DATA:  81 year old female with chills and past history of pneumonia. Evaluate for recurrent pneumonia.  EXAM: CHEST  2 VIEW  COMPARISON:  Prior chest x-ray 05/26/2014  FINDINGS: Stable cardiomegaly. Atherosclerotic calcifications again noted in the transverse aorta. No evidence of focal airspace consolidation to suggest pneumonia. No pleural effusion, pneumothorax or pulmonary edema. No acute osseous abnormality.  IMPRESSION: 1. Stable borderline cardiomegaly. 2. No evidence of airspace consolidation to suggest active pneumonia.   Electronically Signed   By: Malachy Moan M.D.   On: 03/13/2015 16:58   Dg Lumbar Spine 2-3 Views  03/13/2015   CLINICAL DATA:  Chronic low back pain, no known injury  EXAM: LUMBAR SPINE - 2-3 VIEW  COMPARISON:  04/04/2013  FINDINGS: Five lumbar type vertebral bodies.  Mild straightening of the lumbar spine.  No evidence of fracture or dislocation. Vertebral body heights are maintained.  Mild to moderate degenerative changes, most prominent at L5-S1.  Visualized bony pelvis appears intact.  IMPRESSION: Mild to moderate degenerative changes, most prominent  at L5-S1.   Electronically Signed   By: Charline Bills M.D.   On: 03/13/2015 16:59    Assessment & Plan:   Diagnoses and all orders for this visit:  Essential hypertension  Atherosclerosis of native coronary artery of native heart without angina pectoris  I have discontinued Ms. Vanzandt's Phosphatidylserine-DHA-EPA. I am also having her maintain her acetaminophen, aspirin, nitroGLYCERIN, amLODipine, lisinopril, Memantine HCl-Donepezil HCl, carvedilol, atorvastatin, Cholecalciferol, clopidogrel, ticagrelor, BYSTOLIC, CRESTOR, and mirtazapine.  No orders of the defined types were placed in this encounter.     Follow-up: No Follow-up on file.  Sonda Primes, MD

## 2015-08-14 NOTE — Assessment & Plan Note (Signed)
Lipitor, Amlodipine, Plavix, Coreg

## 2015-08-14 NOTE — Assessment & Plan Note (Signed)
On Lisinopril 

## 2015-08-14 NOTE — Assessment & Plan Note (Signed)
Chronic - on Aricept and Namenda Not driving She has a CO and smoke detectors

## 2015-08-14 NOTE — Progress Notes (Signed)
Pre visit review using our clinic review tool, if applicable. No additional management support is needed unless otherwise documented below in the visit note. 

## 2015-09-03 ENCOUNTER — Other Ambulatory Visit: Payer: Self-pay | Admitting: Internal Medicine

## 2015-09-06 ENCOUNTER — Encounter: Payer: Self-pay | Admitting: Cardiovascular Disease

## 2015-09-06 ENCOUNTER — Ambulatory Visit (INDEPENDENT_AMBULATORY_CARE_PROVIDER_SITE_OTHER): Payer: Medicare Other | Admitting: Cardiovascular Disease

## 2015-09-06 VITALS — BP 110/58 | HR 66 | Ht 63.0 in | Wt 129.0 lb

## 2015-09-06 DIAGNOSIS — I2510 Atherosclerotic heart disease of native coronary artery without angina pectoris: Secondary | ICD-10-CM

## 2015-09-06 DIAGNOSIS — I340 Nonrheumatic mitral (valve) insufficiency: Secondary | ICD-10-CM

## 2015-09-06 DIAGNOSIS — I10 Essential (primary) hypertension: Secondary | ICD-10-CM

## 2015-09-06 DIAGNOSIS — E785 Hyperlipidemia, unspecified: Secondary | ICD-10-CM

## 2015-09-06 LAB — LIPID PANEL
CHOL/HDL RATIO: 3.4 ratio (ref <=5.0)
CHOLESTEROL: 140 mg/dL (ref 125–200)
HDL: 41 mg/dL — AB (ref >=46)
LDL Cholesterol: 60 mg/dL (ref <130)
TRIGLYCERIDES: 193 mg/dL — AB (ref <150)
VLDL: 39 mg/dL — AB (ref <30)

## 2015-09-06 LAB — HEPATIC FUNCTION PANEL
ALBUMIN: 4.2 g/dL (ref 3.6–5.1)
ALT: 12 U/L (ref 6–29)
AST: 17 U/L (ref 10–35)
Alkaline Phosphatase: 57 U/L (ref 33–130)
BILIRUBIN DIRECT: 0.1 mg/dL (ref <=0.2)
BILIRUBIN TOTAL: 0.6 mg/dL (ref 0.2–1.2)
Indirect Bilirubin: 0.5 mg/dL (ref 0.2–1.2)
Total Protein: 7.1 g/dL (ref 6.1–8.1)

## 2015-09-06 NOTE — Patient Instructions (Signed)
Medication Instructions:  Your physician has recommended you make the following change in your medication: Stop Aspirin    Labwork:  Lab work to be done today--Lipid and Liver profiles.   Testing/Procedures: none  Follow-Up: Your physician wants you to follow-up in: 12 months.  You will receive a reminder letter in the mail two months in advance. If you don't receive a letter, please call our office to schedule the follow-up appointment.   Any Other Special Instructions Will Be Listed Below (If Applicable).

## 2015-09-06 NOTE — Progress Notes (Signed)
Chief Complaint  Patient presents with  . Back Pain      History of Present Illness: 81 y.o. female with history of CAD s/p PTCA to LAD in 2002, PCI/DES mid LAD and mid to distal RCA in 2015, ischemic CM with normal EF now, HTN, HLD here today for cardiac follow up. She has been followed in the past by Dr. Daleen Squibb. She was admitted 6/27-6/30/15 with a non-STEMI. Cardiac catheterization demonstrated high-grade disease in the mid LAD and mid to distal RCA. She underwent PCI with placement of a DES in the mid LAD and DES in the distal RCA. Of note, echocardiogram did demonstrate an EF of 50% with moderate to severe mitral regurgitation suspicious for ischemic mitral regurgitation and papillary muscle dysfunction. She was seen by Tereso Newcomer, PA-C for hospital f/u and was feeling better. Echo 07/26/14 with normal LV function and mild MR.   She is here today for f/u. She denies chest pain or SOb. No orthopnea, PND or edema. She denies syncope.   Primary Care Physician: Plotnikov  Past Medical History  Diagnosis Date  . CORONARY ARTERY DISEASE     a. 11/2000 Cath/PTCA: LM 20d, LAD 99p (PTCA), LCX 70p, RCA dom, 30m, EF 30 dist ant AK;  b. 2008 Myoview: EF 75%, small ant apical scar, mild peri-infarct ischemia.  [name] HYPERTENSION   . HYPERLIPIDEMIA   . DEPRESSION   . HERPES ZOSTER   . VERTIGO   . DIVERTICULOSIS, COLON   . OSTEOPOROSIS   . ANXIETY   . DEMENTIA   . History of Ischemic Cardiomyopathy     a. 11/2000 LV gram: EF 30%, dist ant AK;  b. 2008 MV EF 75%.  . Dementia   . Hx of echocardiogram     Echo (8/15):  EF 55%, mild apical HK, Gr 1 DD, mild AI, mild MR, severe LAE, mod RAE, mod PI, PASP 38 mmHg    Past Surgical History  Procedure Laterality Date  . Ptca  12/09/2000    successful; of the proximal left anterior descending with reduction of 99% narrowing to 30% with improvement of TIMI grade 1 to TIMI grade 3 flow  . Tonsillectomy and adenoidectomy  1943  . Left heart  catheterization with coronary angiogram N/A 05/28/2014    Procedure: LEFT HEART CATHETERIZATION WITH CORONARY ANGIOGRAM;  Surgeon: Kathleene Hazel, MD;  Location[facility] C[facility] LAB;  Service: Cardiovascular;  Laterality: N/A;  . Percutaneous coronary stent intervention (pci-s)  05/28/2014    Procedure: PERCUTANEOUS CORONARY STENT INTERVENTION (PCI-S);  Surgeon: Kathleene Hazel, MD;  Location: [facility] CATH LAB;  Service: Cardiovascular;;    Current Outpatient Prescriptions  Medication Sig Dispense Refill  . acetaminophen (TYLENOL) 325 MG tablet Take 2 tablets (650 mg total) by mouth every 4 (four) hours as needed for headache or mild pain.    [name] amLODipine (NORVASC) 5 MG tablet TAKE 1/2 TABLET BY MOUTH ONCE DAILY 30 tablet 3  . atorvastatin (LIPITOR) 20 MG tablet Take 1 tablet (20 mg total) by mouth daily. 30 tablet 11  . carvedilol (COREG) 12.5 MG tablet Take 1 tablet (12.5 mg total) by mouth 2 (two) times daily with a meal. 60 tablet 11  . Cholecalciferol ([employer]S VITAMIN D3) 1000 UNITS capsule Take 1 capsule (1,000 Units total) by mouth daily. 100 capsule 3  . clopidogrel (PLAVIX) 75 MG tablet Take 75 mg by mouth daily.    [name] lisinopril (PRINIVIL,ZESTRIL) 5 MG tablet Take 1 tablet (5 mg total) by mouth  daily. 90 tablet 3  . mirtazapine (REMERON) 15 MG tablet TAKE 1 TABLET BY MOUTH EVERY NIGHT AT BEDTIME BETWEEN 6-7 PM 90 tablet 3  . nitroGLYCERIN (NITROSTAT) 0.4 MG SL tablet Place 1 tablet (0.4 mg total) under the tongue every 5 (five) minutes as needed for chest pain. 25 tablet 2   No current facility-administered medications for this visit.    Allergies  Allergen Reactions  . Simvastatin Other (See Comments)    REACTION: memory problem, myalgia    Social History   Social History  . Marital Status: Widowed    Spouse Name: N/A  . Number of Children: N/A  . Years of Education: N/A   Occupational History  .      Retired   Social History Main Topics  . Smoking status: Never Smoker     . Smokeless tobacco: Never Used  . Alcohol Use: No  . Drug Use: No  . Sexual Activity: No   Other Topics Concern  . Not on file   Social History Narrative   Regular exercise-yes    Family History  Problem Relation Age of Onset  . Hypertension Other   . Heart disease Other   . Coronary artery disease Other   . Diabetes Mother   . Heart attack Mother   . Hypertension Daughter   . Sudden death Mother     Review of Systems:  As stated in the HPI and otherwise negative.   BP 110/58 mmHg  Pulse 66  Ht  (1.6 m)  Wt 129 lb (58.514 kg)  BMI 22.86 kg/m2  SpO2 98%  Physical Examination: General: Well developed, well nourished, NAD HEENT: OP clear, mucus membranes moist SKIN: warm, dry. No rashes. Neuro: No focal deficits Musculoskeletal: Muscle strength 5/5 all ext Psychiatric: Mood and affect normal Neck: No JVD, no carotid bruits, no thyromegaly, no lymphadenopathy. Lungs:Clear bilaterally, no wheezes, rhonci, crackles Cardiovascular: Regular rate and rhythm. No murmurs, gallops or rubs. Abdomen:Soft. Bowel sounds present. Non-tender.  Extremities: No lower extremity edema. Pulses are 2 + in the bilateral DP/PT.  LHC (05/28/14): Distal left main 20%, proximal LAD 30%, mid LAD 99%, OM 50-60, proximal RCA 30%, mid RCA 80%/95%. >>> PCI: 2.5 x 15 mm Xience DES in the mid LAD and 2.5 x 38 mm Xience DES in distal RCA  Left main: 20% distal stenosis.  Left Anterior Descending Artery: Large caliber vessel that courses to the apex. The proximal vessel has diffuse 30% stenosis. The mid vessel has a focal 99% stenosis. There are several small caliber diagonal branches.  Circumflex Artery: Large caliber vessel that terminates into a large caliber obtuse marginal branch. The OM branch has a focal 50-60% stenosis.  Right Coronary Artery: Large dominant vessel with 30% proximal stenosis, 80% mid stenosis followed by serial 95% stenoses in the distal vessel. The posterolateral branch  and PDA are moderate in caliber and patent with mild diffuse disease.   Echo 07/26/14: Left ventricle: LVEF is approximately 55% with mild apical hypokinesis. The cavity size was normal. Wall thickness was normal. Doppler parameters are consistent with abnormal left ventricular relaxation (grade 1 diastolic dysfunction). - Aortic valve: There was mild regurgitation. - Mitral valve: There was mild regurgitation. - Left atrium: The atrium was severely dilated. - Right atrium: The atrium was moderately dilated. - Pulmonic valve: There was moderate regurgitation. - Pulmonary arteries: PA peak pressure: 38 mm Hg (S).  EKG:  EKG is not ordered today. The ekg ordered today demonstrates  Recent Labs: 03/13/2015: ALT 16; Hemoglobin 15.0; Platelets 224.0; TSH 3.44 08/14/2015: BUN 26*; Creatinine, Ser 1.07; Potassium 4.4; Sodium 140   Lipid Panel    Component Value Date/Time   CHOL 119 07/26/2014 0925   TRIG 129.0 07/26/2014 0925   HDL 56.70 07/26/2014 0925   CHOLHDL 2 07/26/2014 0925   VLDL 25.8 07/26/2014 0925   LDLCALC 37 07/26/2014 0925     Wt Readings from Last 3 Encounters:  09/06/15 129 lb (58.514 kg)  08/14/15 129 lb (58.514 kg)  03/20/15 131 lb (59.421 kg)     Other studies Reviewed: Additional studies/ records that were reviewed today include: . Review of the above records demonstrates:    Assessment and Plan:   1. Coronary artery disease: Stable. No angina. Continue Plavix, beta blocker, statin.   Will stop ASA.   2. Mitral regurgitation: Mild by echo 07/26/14. Will follow.   3. Hyperlipidemia: Lipids well controlled. Continue statin. Check lipids and LFTs now.   4. Essential hypertension: BP controlled. No changes.    Current medicines are reviewed at length with the patient today.  The patient does not have concerns regarding medicines.  The following changes have been made:  no change  Labs/ tests ordered today include:   Orders Placed This Encounter   Procedures  . Hepatic function panel  . Lipid Profile    Disposition:   FU with me in 12  months  Signed, Verne Carrow, MD 09/06/2015 10:57 AM    [facility] [address] [place], [place], [place]  [address] Phone: [phone]; Fax: [phone]

## 2015-10-30 DIAGNOSIS — H5213 Myopia, bilateral: Secondary | ICD-10-CM | POA: Diagnosis not present

## 2015-10-30 DIAGNOSIS — H524 Presbyopia: Secondary | ICD-10-CM | POA: Diagnosis not present

## 2015-11-26 ENCOUNTER — Other Ambulatory Visit: Payer: Self-pay | Admitting: Internal Medicine

## 2015-12-17 ENCOUNTER — Encounter: Payer: Self-pay | Admitting: Internal Medicine

## 2015-12-17 ENCOUNTER — Ambulatory Visit (INDEPENDENT_AMBULATORY_CARE_PROVIDER_SITE_OTHER): Payer: Medicare Other | Admitting: Internal Medicine

## 2015-12-17 VITALS — BP 124/64 | HR 69 | Wt 127.0 lb

## 2015-12-17 DIAGNOSIS — F4321 Adjustment disorder with depressed mood: Secondary | ICD-10-CM

## 2015-12-17 DIAGNOSIS — R413 Other amnesia: Secondary | ICD-10-CM | POA: Diagnosis not present

## 2015-12-17 DIAGNOSIS — I214 Non-ST elevation (NSTEMI) myocardial infarction: Secondary | ICD-10-CM | POA: Diagnosis not present

## 2015-12-17 DIAGNOSIS — I10 Essential (primary) hypertension: Secondary | ICD-10-CM | POA: Diagnosis not present

## 2015-12-17 DIAGNOSIS — F0280 Dementia in other diseases classified elsewhere without behavioral disturbance: Secondary | ICD-10-CM

## 2015-12-17 DIAGNOSIS — G309 Alzheimer's disease, unspecified: Secondary | ICD-10-CM

## 2015-12-17 MED ORDER — LISINOPRIL 5 MG PO TABS: 5.0000 mg | ORAL_TABLET | Freq: Every day | ORAL | Status: DC

## 2015-12-17 MED ORDER — MIRTAZAPINE 15 MG PO TABS: 15.0000 mg | ORAL_TABLET | Freq: Every day | ORAL | Status: DC

## 2015-12-17 MED ORDER — ATORVASTATIN CALCIUM 20 MG PO TABS: 20.0000 mg | ORAL_TABLET | Freq: Every day | ORAL | Status: DC

## 2015-12-17 MED ORDER — CARVEDILOL 12.5 MG PO TABS: 12.5000 mg | ORAL_TABLET | Freq: Two times a day (BID) | ORAL | Status: DC

## 2015-12-17 NOTE — Assessment & Plan Note (Signed)
On Rx  No CP

## 2015-12-17 NOTE — Assessment & Plan Note (Signed)
Pt lives w/dtr Zella Ball

## 2015-12-17 NOTE — Assessment & Plan Note (Signed)
Namzaric 

## 2015-12-17 NOTE — Progress Notes (Signed)
Subjective:  Patient ID: [name], female    DOB: Jun 10, 1927  Age: 82 y.o. MRN: [id number]  CC: No chief complaint on file.   HPI [name] SHEYLA Kelley presents for dementia, dyslipidemia, HTN f/u. Lives w/dtr.  Outpatient Prescriptions Prior to Visit  Medication Sig Dispense Refill  . acetaminophen (TYLENOL) 325 MG tablet Take 2 tablets (650 mg total) by mouth every 4 (four) hours as needed for headache or mild pain.    [name] amLODipine (NORVASC) 5 MG tablet TAKE 1/2 TABLET BY MOUTH ONCE DAILY 30 tablet 3  . atorvastatin (LIPITOR) 20 MG tablet Take 1 tablet (20 mg total) by mouth daily. 30 tablet 11  . carvedilol (COREG) 12.5 MG tablet Take 1 tablet (12.5 mg total) by mouth 2 (two) times daily with a meal. 60 tablet 11  . Cholecalciferol ([employer] VITAMIN D3) 1000 UNITS capsule Take 1 capsule (1,000 Units total) by mouth daily. 100 capsule 3  . clopidogrel (PLAVIX) 75 MG tablet Take 75 mg by mouth daily.    [name] lisinopril (PRINIVIL,ZESTRIL) 5 MG tablet TAKE 1 TABLET BY MOUTH DAILY 90 tablet 0  . mirtazapine (REMERON) 15 MG tablet TAKE 1 TABLET BY MOUTH EVERY NIGHT AT BEDTIME BETWEEN 6-7 PM 90 tablet 3  . nitroGLYCERIN (NITROSTAT) 0.4 MG SL tablet Place 1 tablet (0.4 mg total) under the tongue every 5 (five) minutes as needed for chest pain. 25 tablet 2   No facility-administered medications prior to visit.    ROS Review of Systems  Constitutional: Negative for chills, activity change, appetite change, fatigue and unexpected weight change.  HENT: Negative for congestion, mouth sores and sinus pressure.   Eyes: Negative for visual disturbance.  Respiratory: Negative for cough and chest tightness.   Gastrointestinal: Negative for nausea and abdominal pain.  Genitourinary: Negative for frequency, difficulty urinating and vaginal pain.  Musculoskeletal: Negative for back pain and gait problem.  Skin: Negative for pallor and rash.  Neurological: Negative for dizziness, tremors, weakness, numbness  and headaches.  Psychiatric/Behavioral: Negative for confusion and sleep disturbance.    Objective:  BP 124/64 mmHg  Pulse 69  Wt 127 lb (57.607 kg)  SpO2 98%  BP Readings from Last 3 Encounters:  12/17/15 124/64  09/06/15 110/58  08/14/15 138/80    Wt Readings from Last 3 Encounters:  12/17/15 127 lb (57.607 kg)  09/06/15 129 lb (58.514 kg)  08/14/15 129 lb (58.514 kg)    Physical Exam  Constitutional: She appears well-developed. No distress.  HENT:  Head: Normocephalic.  Right Ear: External ear normal.  Left Ear: External ear normal.  Nose: Nose normal.  Mouth/Throat: Oropharynx is clear and moist.  Eyes: Conjunctivae are normal. Pupils are equal, round, and reactive to light. Right eye exhibits no discharge. Left eye exhibits no discharge.  Neck: Normal range of motion. Neck supple. No JVD present. No tracheal deviation present. No thyromegaly present.  Cardiovascular: Normal rate, regular rhythm and normal heart sounds.   Pulmonary/Chest: No stridor. No respiratory distress. She has no wheezes.  Abdominal: Soft. Bowel sounds are normal. She exhibits no distension and no mass. There is no tenderness. There is no rebound and no guarding.  Musculoskeletal: She exhibits no edema or tenderness.  Lymphadenopathy:    She has no cervical adenopathy.  Neurological: She displays normal reflexes. No cranial nerve deficit. She exhibits normal muscle tone. Coordination normal.  Skin: No rash noted. No erythema.  Psychiatric: She has a normal mood and affect. Her behavior is normal. Judgment and  thought content normal.    Lab Results  Component Value Date   WBC 8.8 03/13/2015   HGB 15.0 03/13/2015   HCT 45.1 03/13/2015   PLT 224.0 03/13/2015   GLUCOSE 101* 08/14/2015   CHOL 140 09/06/2015   TRIG 193* 09/06/2015   HDL 41* 09/06/2015   LDLCALC 60 09/06/2015   ALT 12 09/06/2015   AST 17 09/06/2015   NA 140 08/14/2015   K 4.4 08/14/2015   CL 105 08/14/2015   CREATININE  1.07 08/14/2015   BUN 26* 08/14/2015   CO2 25 08/14/2015   TSH 3.44 03/13/2015   INR 0.97 03/07/2015    Dg Chest 2 View  03/13/2015  CLINICAL DATA:  82 year old female with chills and past history of pneumonia. Evaluate for recurrent pneumonia. EXAM: CHEST  2 VIEW COMPARISON:  Prior chest x-ray 05/26/2014 FINDINGS: Stable cardiomegaly. Atherosclerotic calcifications again noted in the transverse aorta. No evidence of focal airspace consolidation to suggest pneumonia. No pleural effusion, pneumothorax or pulmonary edema. No acute osseous abnormality. IMPRESSION: 1. Stable borderline cardiomegaly. 2. No evidence of airspace consolidation to suggest active pneumonia. Electronically Signed   By: [name] M.D.   On: 03/13/2015 16:58   Dg Lumbar Spine 2-3 Views  03/13/2015  CLINICAL DATA:  Chronic low back pain, no known injury EXAM: LUMBAR SPINE - 2-3 VIEW COMPARISON:  04/04/2013 FINDINGS: Five lumbar type vertebral bodies. Mild straightening of the lumbar spine. No evidence of fracture or dislocation. Vertebral body heights are maintained. Mild to moderate degenerative changes, most prominent at L5-S1. Visualized bony pelvis appears intact. IMPRESSION: Mild to moderate degenerative changes, most prominent at L5-S1. Electronically Signed   By: [name] M.D.   On: 03/13/2015 16:59    Assessment & Plan:   Diagnoses and all orders for this visit:  Memory loss  Situational depression  NSTEMI (non-ST elevated myocardial infarction) (HCC)  Essential hypertension  Alzheimer's dementia  Other orders -     atorvastatin (LIPITOR) 20 MG tablet; Take 1 tablet (20 mg total) by mouth daily. -     carvedilol (COREG) 12.5 MG tablet; Take 1 tablet (12.5 mg total) by mouth 2 (two) times daily with a meal. -     lisinopril (PRINIVIL,ZESTRIL) 5 MG tablet; Take 1 tablet (5 mg total) by mouth daily. -     mirtazapine (REMERON) 15 MG tablet; Take 1 tablet (15 mg total) by mouth at bedtime.  I  am having Ms. Fritzler maintain her acetaminophen, nitroGLYCERIN, carvedilol, atorvastatin, Cholecalciferol, mirtazapine, amLODipine, clopidogrel, lisinopril, and NAMZARIC.  Meds ordered this encounter  Medications  . NAMZARIC 28-10 MG CP24    Sig: Take 1 capsule by mouth daily.    Refill:  11     Follow-up: Return in about 4 months (around 04/15/2016) for a follow-up visit.  Sonda Primes, MD

## 2015-12-17 NOTE — Assessment & Plan Note (Signed)
On Remeron 

## 2015-12-17 NOTE — Assessment & Plan Note (Signed)
Coreg, Amlodipine On Lisinopril

## 2015-12-17 NOTE — Progress Notes (Signed)
Pre visit review using our clinic review tool, if applicable. No additional management support is needed unless otherwise documented below in the visit note. 

## 2016-01-31 ENCOUNTER — Other Ambulatory Visit: Payer: Self-pay | Admitting: Internal Medicine

## 2016-03-10 ENCOUNTER — Other Ambulatory Visit: Payer: Self-pay | Admitting: Cardiovascular Disease

## 2016-04-14 ENCOUNTER — Encounter: Payer: Self-pay | Admitting: Internal Medicine

## 2016-04-14 ENCOUNTER — Ambulatory Visit (INDEPENDENT_AMBULATORY_CARE_PROVIDER_SITE_OTHER): Payer: Medicare Other | Admitting: Internal Medicine

## 2016-04-14 VITALS — BP 140/70 | HR 72 | Wt 127.0 lb

## 2016-04-14 DIAGNOSIS — G309 Alzheimer's disease, unspecified: Secondary | ICD-10-CM

## 2016-04-14 DIAGNOSIS — I10 Essential (primary) hypertension: Secondary | ICD-10-CM

## 2016-04-14 DIAGNOSIS — F4321 Adjustment disorder with depressed mood: Secondary | ICD-10-CM

## 2016-04-14 DIAGNOSIS — F0280 Dementia in other diseases classified elsewhere without behavioral disturbance: Secondary | ICD-10-CM

## 2016-04-14 DIAGNOSIS — F411 Generalized anxiety disorder: Secondary | ICD-10-CM | POA: Diagnosis not present

## 2016-04-14 DIAGNOSIS — R413 Other amnesia: Secondary | ICD-10-CM

## 2016-04-14 DIAGNOSIS — I2510 Atherosclerotic heart disease of native coronary artery without angina pectoris: Secondary | ICD-10-CM

## 2016-04-14 MED ORDER — PHOSPHATIDYLSERINE-DHA-EPA 100-19.5-6.5 MG PO CAPS: 1.0000 | ORAL_CAPSULE | Freq: Every day | ORAL | Status: DC

## 2016-04-14 NOTE — Assessment & Plan Note (Signed)
5/17 Vayacog added

## 2016-04-14 NOTE — Progress Notes (Signed)
Subjective:  Patient ID: [name], female    DOB: 02-03-1927  Age: 82 y.o. MRN: [id number]  CC: No chief complaint on file.   HPI [name] presents for dementia, CAD, HTN f/u. Memory is getting worse  Outpatient Prescriptions Prior to Visit  Medication Sig Dispense Refill  . acetaminophen (TYLENOL) 325 MG tablet Take 2 tablets (650 mg total) by mouth every 4 (four) hours as needed for headache or mild pain.    [name] amLODipine (NORVASC) 5 MG tablet TAKE 1/2 TABLET BY MOUTH ONCE DAILY 30 tablet 3  . atorvastatin (LIPITOR) 20 MG tablet Take 1 tablet (20 mg total) by mouth daily. 90 tablet 3  . carvedilol (COREG) 12.5 MG tablet TAKE 1 TABLET BY MOUTH TWICE DAILY WITH A MEAL 60 tablet 5  . Cholecalciferol ([employer] VITAMIN D3) 1000 UNITS capsule Take 1 capsule (1,000 Units total) by mouth daily. 100 capsule 3  . clopidogrel (PLAVIX) 75 MG tablet Take 75 mg by mouth daily.    [name] lisinopril (PRINIVIL,ZESTRIL) 5 MG tablet Take 1 tablet (5 mg total) by mouth daily. 90 tablet 3  . mirtazapine (REMERON) 15 MG tablet Take 1 tablet (15 mg total) by mouth at bedtime. 90 tablet 3  . NAMZARIC 28-10 MG CP24 TAKE 1 CAPSULE BY MOUTH DAILY 30 capsule 5  . nitroGLYCERIN (NITROSTAT) 0.4 MG SL tablet Place 1 tablet (0.4 mg total) under the tongue every 5 (five) minutes as needed for chest pain. 25 tablet 2  . clopidogrel (PLAVIX) 75 MG tablet TAKE 1 TABLET BY MOUTH EVERY DAY (Patient not taking: Reported on 04/14/2016) 90 tablet 1   No facility-administered medications prior to visit.    ROS Review of Systems  Constitutional: Negative for chills, activity change, appetite change, fatigue and unexpected weight change.  HENT: Negative for congestion, mouth sores and sinus pressure.   Eyes: Negative for visual disturbance.  Respiratory: Negative for cough and chest tightness.   Gastrointestinal: Negative for nausea and abdominal pain.  Genitourinary: Negative for frequency, difficulty urinating and vaginal  pain.  Musculoskeletal: Negative for back pain and gait problem.  Skin: Negative for pallor and rash.  Neurological: Negative for dizziness, tremors, weakness, numbness and headaches.  Psychiatric/Behavioral: Positive for confusion and decreased concentration. Negative for suicidal ideas, sleep disturbance and agitation. The patient is not nervous/anxious.     Objective:  BP 140/70 mmHg  Pulse 72  Wt 127 lb (57.607 kg)  SpO2 95%  BP Readings from Last 3 Encounters:  04/14/16 140/70  12/17/15 124/64  09/06/15 110/58    Wt Readings from Last 3 Encounters:  04/14/16 127 lb (57.607 kg)  12/17/15 127 lb (57.607 kg)  09/06/15 129 lb (58.514 kg)    Physical Exam  Constitutional: She appears well-developed. No distress.  HENT:  Head: Normocephalic.  Right Ear: External ear normal.  Left Ear: External ear normal.  Nose: Nose normal.  Mouth/Throat: Oropharynx is clear and moist.  Eyes: Conjunctivae are normal. Pupils are equal, round, and reactive to light. Right eye exhibits no discharge. Left eye exhibits no discharge.  Neck: Normal range of motion. Neck supple. No JVD present. No tracheal deviation present. No thyromegaly present.  Cardiovascular: Normal rate, regular rhythm and normal heart sounds.   Pulmonary/Chest: No stridor. No respiratory distress. She has no wheezes.  Abdominal: Soft. Bowel sounds are normal. She exhibits no distension and no mass. There is no tenderness. There is no rebound and no guarding.  Musculoskeletal: She exhibits no edema or  tenderness.  Lymphadenopathy:    She has no cervical adenopathy.  Neurological: She displays normal reflexes. No cranial nerve deficit. She exhibits normal muscle tone. Coordination abnormal.  Skin: No rash noted. No erythema.  Psychiatric: She has a normal mood and affect. Her behavior is normal.  disoriented   Lab Results  Component Value Date   WBC 8.8 03/13/2015   HGB 15.0 03/13/2015   HCT 45.1 03/13/2015   PLT  224.0 03/13/2015   GLUCOSE 101* 08/14/2015   CHOL 140 09/06/2015   TRIG 193* 09/06/2015   HDL 41* 09/06/2015   LDLCALC 60 09/06/2015   ALT 12 09/06/2015   AST 17 09/06/2015   NA 140 08/14/2015   K 4.4 08/14/2015   CL 105 08/14/2015   CREATININE 1.07 08/14/2015   BUN 26* 08/14/2015   CO2 25 08/14/2015   TSH 3.44 03/13/2015   INR 0.97 03/07/2015    Dg Chest 2 View  03/13/2015  CLINICAL DATA:  82 year old female with chills and past history of pneumonia. Evaluate for recurrent pneumonia. EXAM: CHEST  2 VIEW COMPARISON:  Prior chest x-ray 05/26/2014 FINDINGS: Stable cardiomegaly. Atherosclerotic calcifications again noted in the transverse aorta. No evidence of focal airspace consolidation to suggest pneumonia. No pleural effusion, pneumothorax or pulmonary edema. No acute osseous abnormality. IMPRESSION: 1. Stable borderline cardiomegaly. 2. No evidence of airspace consolidation to suggest active pneumonia. Electronically Signed   By: Malachy MoanHeath  McCullough M.D.   On: 03/13/2015 16:58   Dg Lumbar Spine 2-3 Views  03/13/2015  CLINICAL DATA:  Chronic low back pain, no known injury EXAM: LUMBAR SPINE - 2-3 VIEW COMPARISON:  04/04/2013 FINDINGS: Five lumbar type vertebral bodies. Mild straightening of the lumbar spine. No evidence of fracture or dislocation. Vertebral body heights are maintained. Mild to moderate degenerative changes, most prominent at L5-S1. Visualized bony pelvis appears intact. IMPRESSION: Mild to moderate degenerative changes, most prominent at L5-S1. Electronically Signed   By: Charline BillsSriyesh  Krishnan M.D.   On: 03/13/2015 16:59    Assessment & Plan:   There are no diagnoses linked to this encounter. I am having [name] maintain her acetaminophen, nitroGLYCERIN, Cholecalciferol, amLODipine, clopidogrel, atorvastatin, lisinopril, mirtazapine, NAMZARIC, and carvedilol.  No orders of the defined types were placed in this encounter.     Follow-up: No Follow-up on file.  Sonda PrimesAlex  Carianna Lague, MD

## 2016-04-14 NOTE — Assessment & Plan Note (Addendum)
Worse  On Namzaric  5/17 Vayacog added

## 2016-04-14 NOTE — Assessment & Plan Note (Signed)
Chronic Coreg, Amlodipine On Lisinopril

## 2016-04-14 NOTE — Assessment & Plan Note (Signed)
On Remeron 

## 2016-04-14 NOTE — Progress Notes (Signed)
Pre visit review using our clinic review tool, if applicable. No additional management support is needed unless otherwise documented below in the visit note. 

## 2016-04-14 NOTE — Assessment & Plan Note (Signed)
Doing well 

## 2016-04-14 NOTE — Assessment & Plan Note (Signed)
Chronic. No angina Lipitor, Amlodipine, Plavix, Coreg NTG prn

## 2016-06-04 DIAGNOSIS — H04123 Dry eye syndrome of bilateral lacrimal glands: Secondary | ICD-10-CM | POA: Diagnosis not present

## 2016-06-04 DIAGNOSIS — H26493 Other secondary cataract, bilateral: Secondary | ICD-10-CM | POA: Diagnosis not present

## 2016-07-08 ENCOUNTER — Encounter: Payer: Self-pay | Admitting: Internal Medicine

## 2016-07-08 ENCOUNTER — Other Ambulatory Visit (INDEPENDENT_AMBULATORY_CARE_PROVIDER_SITE_OTHER): Payer: Medicare Other

## 2016-07-08 ENCOUNTER — Ambulatory Visit (INDEPENDENT_AMBULATORY_CARE_PROVIDER_SITE_OTHER): Payer: Medicare Other | Admitting: Internal Medicine

## 2016-07-08 DIAGNOSIS — M545 Low back pain, unspecified: Secondary | ICD-10-CM

## 2016-07-08 DIAGNOSIS — G309 Alzheimer's disease, unspecified: Secondary | ICD-10-CM

## 2016-07-08 DIAGNOSIS — F0280 Dementia in other diseases classified elsewhere without behavioral disturbance: Secondary | ICD-10-CM | POA: Diagnosis not present

## 2016-07-08 DIAGNOSIS — R5383 Other fatigue: Secondary | ICD-10-CM | POA: Diagnosis not present

## 2016-07-08 LAB — BASIC METABOLIC PANEL
BUN: 20 mg/dL (ref 6–23)
CHLORIDE: 106 meq/L (ref 96–112)
CO2: 29 mEq/L (ref 19–32)
CREATININE: 1.11 mg/dL (ref 0.40–1.20)
Calcium: 9.5 mg/dL (ref 8.4–10.5)
GFR: 49.43 mL/min — ABNORMAL LOW (ref >60.00)
Glucose, Bld: 96 mg/dL (ref 70–99)
POTASSIUM: 4.3 meq/L (ref 3.5–5.1)
SODIUM: 140 meq/L (ref 135–145)

## 2016-07-08 LAB — CBC WITH DIFFERENTIAL/PLATELET
BASOS PCT: 0.4 % (ref 0.0–3.0)
Basophils Absolute: 0 10*3/uL (ref 0.0–0.1)
EOS ABS: 0.1 10*3/uL (ref 0.0–0.7)
Eosinophils Relative: 1.9 % (ref 0.0–5.0)
HEMATOCRIT: 38.6 % (ref 36.0–46.0)
HEMOGLOBIN: 13.5 g/dL (ref 12.0–15.0)
LYMPHS PCT: 24.9 % (ref 12.0–46.0)
Lymphs Abs: 1.4 10*3/uL (ref 0.7–4.0)
MCHC: 35 g/dL (ref 30.0–36.0)
MCV: 87.7 fl (ref 78.0–100.0)
MONOS PCT: 11.7 % (ref 3.0–12.0)
Monocytes Absolute: 0.7 10*3/uL (ref 0.1–1.0)
Neutro Abs: 3.4 10*3/uL (ref 1.4–7.7)
Neutrophils Relative %: 61.1 % (ref 43.0–77.0)
Platelets: 192 10*3/uL (ref 150.0–400.0)
RBC: 4.41 Mil/uL (ref 3.87–5.11)
RDW: 14.6 % (ref 11.5–15.5)
WBC: 5.6 10*3/uL (ref 4.0–10.5)

## 2016-07-08 LAB — URINALYSIS, ROUTINE W REFLEX MICROSCOPIC
Bilirubin Urine: NEGATIVE
Ketones, ur: NEGATIVE
Nitrite: NEGATIVE
Specific Gravity, Urine: 1.015 (ref 1.000–1.030)
Total Protein, Urine: NEGATIVE
Urine Glucose: NEGATIVE
Urobilinogen, UA: 0.2 (ref 0.0–1.0)
pH: 6 (ref 5.0–8.0)

## 2016-07-08 LAB — HEPATIC FUNCTION PANEL
ALT: 10 U/L (ref 0–35)
AST: 16 U/L (ref 0–37)
Albumin: 4.3 g/dL (ref 3.5–5.2)
Alkaline Phosphatase: 57 U/L (ref 39–117)
BILIRUBIN DIRECT: 0.2 mg/dL (ref 0.0–0.3)
BILIRUBIN TOTAL: 0.7 mg/dL (ref 0.2–1.2)
Total Protein: 7.5 g/dL (ref 6.0–8.3)

## 2016-07-08 LAB — TSH: TSH: 3.42 u[IU]/mL (ref 0.35–4.50)

## 2016-07-08 NOTE — Progress Notes (Signed)
Subjective:  Patient ID: [name], female    DOB: Oct 09, 1927  Age: 82 y.o. MRN: [id number]  CC: Ear Pain (Left x 1 week, some better today); Fatigue; Gait Problem (becoming more unsteady recently); and Memory Loss (worsening per daughter)   HPI [name] presents for dementia, dyslipidemia, HTN. C/o fatigue, apathy  Outpatient Medications Prior to Visit  Medication Sig Dispense Refill  . acetaminophen (TYLENOL) 325 MG tablet Take 2 tablets (650 mg total) by mouth every 4 (four) hours as needed for headache or mild pain.    [name] amLODipine (NORVASC) 5 MG tablet TAKE 1/2 TABLET BY MOUTH ONCE DAILY 30 tablet 3  . atorvastatin (LIPITOR) 20 MG tablet Take 1 tablet (20 mg total) by mouth daily. 90 tablet 3  . carvedilol (COREG) 12.5 MG tablet TAKE 1 TABLET BY MOUTH TWICE DAILY WITH A MEAL 60 tablet 5  . Cholecalciferol ([employer] VITAMIN D3) 1000 UNITS capsule Take 1 capsule (1,000 Units total) by mouth daily. 100 capsule 3  . clopidogrel (PLAVIX) 75 MG tablet Take 75 mg by mouth daily.    [name] lisinopril (PRINIVIL,ZESTRIL) 5 MG tablet Take 1 tablet (5 mg total) by mouth daily. 90 tablet 3  . mirtazapine (REMERON) 15 MG tablet Take 1 tablet (15 mg total) by mouth at bedtime. 90 tablet 3  . NAMZARIC 28-10 MG CP24 TAKE 1 CAPSULE BY MOUTH DAILY 30 capsule 5  . nitroGLYCERIN (NITROSTAT) 0.4 MG SL tablet Place 1 tablet (0.4 mg total) under the tongue every 5 (five) minutes as needed for chest pain. 25 tablet 2  . Phosphatidylserine-DHA-EPA (VAYACOG) 100-19.5-6.5 MG CAPS Take 1 capsule by mouth daily. 30 capsule 11   No facility-administered medications prior to visit.     ROS Review of Systems  Constitutional: Positive for fatigue. Negative for activity change, appetite change, chills, fever and unexpected weight change.  HENT: Negative for congestion, mouth sores and sinus pressure.   Eyes: Negative for visual disturbance.  Respiratory: Negative for cough and chest tightness.   Gastrointestinal:  Negative for abdominal pain and nausea.  Genitourinary: Negative for difficulty urinating, frequency and vaginal pain.  Musculoskeletal: Negative for back pain and gait problem.  Skin: Negative for pallor and rash.  Neurological: Negative for dizziness, tremors, weakness, numbness and headaches.  Psychiatric/Behavioral: Positive for confusion and decreased concentration. Negative for sleep disturbance. The patient is nervous/anxious.     Objective:  BP 140/80   Pulse 82   Temp 97.5 F (36.4 C) (Oral)   Wt 126 lb (57.2 kg)   SpO2 97%   BMI 22.32 kg/m   BP Readings from Last 3 Encounters:  07/08/16 140/80  04/14/16 140/70  12/17/15 124/64    Wt Readings from Last 3 Encounters:  07/08/16 126 lb (57.2 kg)  04/14/16 127 lb (57.6 kg)  12/17/15 127 lb (57.6 kg)    Physical Exam  Constitutional: She appears well-developed. No distress.  HENT:  Head: Normocephalic.  Right Ear: External ear normal.  Left Ear: External ear normal.  Nose: Nose normal.  Mouth/Throat: Oropharynx is clear and moist.  Eyes: Conjunctivae are normal. Pupils are equal, round, and reactive to light. Right eye exhibits no discharge. Left eye exhibits no discharge.  Neck: Normal range of motion. Neck supple. No JVD present. No tracheal deviation present. No thyromegaly present.  Cardiovascular: Normal rate, regular rhythm and normal heart sounds.   Pulmonary/Chest: No stridor. No respiratory distress. She has no wheezes.  Abdominal: Soft. Bowel sounds are normal. She exhibits  no distension and no mass. There is no tenderness. There is no rebound and no guarding.  Musculoskeletal: She exhibits no edema or tenderness.  Lymphadenopathy:    She has no cervical adenopathy.  Neurological: She displays normal reflexes. No cranial nerve deficit. She exhibits normal muscle tone. Coordination abnormal.  Skin: No rash noted. No erythema.  Psychiatric: She has a normal mood and affect. Her behavior is normal.    confused Ataxic a little  Lab Results  Component Value Date   WBC 8.8 03/13/2015   HGB 15.0 03/13/2015   HCT 45.1 03/13/2015   PLT 224.0 03/13/2015   GLUCOSE 101 (H) 08/14/2015   CHOL 140 09/06/2015   TRIG 193 (H) 09/06/2015   HDL 41 (L) 09/06/2015   LDLCALC 60 09/06/2015   ALT 12 09/06/2015   AST 17 09/06/2015   NA 140 08/14/2015   K 4.4 08/14/2015   CL 105 08/14/2015   CREATININE 1.07 08/14/2015   BUN 26 (H) 08/14/2015   CO2 25 08/14/2015   TSH 3.44 03/13/2015   INR 0.97 03/07/2015    Dg Chest 2 View  Result Date: 03/13/2015 CLINICAL DATA:  82 year old female with chills and past history of pneumonia. Evaluate for recurrent pneumonia. EXAM: CHEST  2 VIEW COMPARISON:  Prior chest x-ray 05/26/2014 FINDINGS: Stable cardiomegaly. Atherosclerotic calcifications again noted in the transverse aorta. No evidence of focal airspace consolidation to suggest pneumonia. No pleural effusion, pneumothorax or pulmonary edema. No acute osseous abnormality. IMPRESSION: 1. Stable borderline cardiomegaly. 2. No evidence of airspace consolidation to suggest active pneumonia. Electronically Signed   By: Malachy MoanHeath  McCullough M.D.   On: 03/13/2015 16:58   Dg Lumbar Spine 2-3 Views  Result Date: 03/13/2015 CLINICAL DATA:  Chronic low back pain, no known injury EXAM: LUMBAR SPINE - 2-3 VIEW COMPARISON:  04/04/2013 FINDINGS: Five lumbar type vertebral bodies. Mild straightening of the lumbar spine. No evidence of fracture or dislocation. Vertebral body heights are maintained. Mild to moderate degenerative changes, most prominent at L5-S1. Visualized bony pelvis appears intact. IMPRESSION: Mild to moderate degenerative changes, most prominent at L5-S1. Electronically Signed   By: Charline BillsSriyesh  Krishnan M.D.   On: 03/13/2015 16:59    Assessment & Plan:   There are no diagnoses linked to this encounter. I am having [name] maintain her acetaminophen, nitroGLYCERIN, Cholecalciferol, amLODipine, clopidogrel,  atorvastatin, lisinopril, mirtazapine, NAMZARIC, carvedilol, and Phosphatidylserine-DHA-EPA.  No orders of the defined types were placed in this encounter.    Follow-up: No Follow-up on file.  Sonda PrimesAlex Plotnikov, MD

## 2016-07-08 NOTE — Assessment & Plan Note (Signed)
Worse slightly Namzaric

## 2016-07-08 NOTE — Progress Notes (Signed)
Pre visit review using our clinic review tool, if applicable. No additional management support is needed unless otherwise documented below in the visit note. 

## 2016-07-08 NOTE — Assessment & Plan Note (Signed)
Worse Labs 

## 2016-07-08 NOTE — Assessment & Plan Note (Signed)
UA

## 2016-09-14 ENCOUNTER — Ambulatory Visit: Payer: Medicare Other | Admitting: Internal Medicine

## 2016-09-22 ENCOUNTER — Ambulatory Visit (INDEPENDENT_AMBULATORY_CARE_PROVIDER_SITE_OTHER): Payer: Medicare Other

## 2016-09-22 DIAGNOSIS — Z23 Encounter for immunization: Secondary | ICD-10-CM | POA: Diagnosis not present

## 2016-10-06 ENCOUNTER — Other Ambulatory Visit: Payer: Self-pay | Admitting: Internal Medicine

## 2016-10-16 ENCOUNTER — Ambulatory Visit (INDEPENDENT_AMBULATORY_CARE_PROVIDER_SITE_OTHER): Payer: Medicare Other | Admitting: Internal Medicine

## 2016-10-16 ENCOUNTER — Encounter: Payer: Self-pay | Admitting: Internal Medicine

## 2016-10-16 DIAGNOSIS — I2510 Atherosclerotic heart disease of native coronary artery without angina pectoris: Secondary | ICD-10-CM

## 2016-10-16 DIAGNOSIS — M545 Low back pain, unspecified: Secondary | ICD-10-CM

## 2016-10-16 DIAGNOSIS — G301 Alzheimer's disease with late onset: Secondary | ICD-10-CM | POA: Diagnosis not present

## 2016-10-16 DIAGNOSIS — F02818 Dementia in other diseases classified elsewhere, unspecified severity, with other behavioral disturbance: Secondary | ICD-10-CM

## 2016-10-16 DIAGNOSIS — I10 Essential (primary) hypertension: Secondary | ICD-10-CM | POA: Diagnosis not present

## 2016-10-16 DIAGNOSIS — F0281 Dementia in other diseases classified elsewhere with behavioral disturbance: Secondary | ICD-10-CM

## 2016-10-16 DIAGNOSIS — G8929 Other chronic pain: Secondary | ICD-10-CM

## 2016-10-16 NOTE — Progress Notes (Signed)
Pre visit review using our clinic review tool, if applicable. No additional management support is needed unless otherwise documented below in the visit note. 

## 2016-10-16 NOTE — Progress Notes (Signed)
Subjective:  Patient ID: [name], female    DOB: 1927/11/11  Age: 82 y.o. MRN: [id number]  CC: No chief complaint on file.   HPI [name] presents for dementia - worse; more confused..father/u incontinence, anxiety. C/o constant confusion.    Outpatient Medications Prior to Visit  Medication Sig Dispense Refill  . acetaminophen (TYLENOL) 325 MG tablet Take 2 tablets (650 mg total) by mouth every 4 (four) hours as needed for headache or mild pain.    [name]. amLODipine (NORVASC) 5 MG tablet TAKE 1/2 TABLET BY MOUTH ONCE DAILY 45 tablet 2  . atorvastatin (LIPITOR) 20 MG tablet Take 1 tablet (20 mg total) by mouth daily. 90 tablet 3  . carvedilol (COREG) 12.5 MG tablet TAKE 1 TABLET BY MOUTH TWICE DAILY WITH A MEAL 60 tablet 5  . Cholecalciferol ([employer] VITAMIN D3) 1000 UNITS capsule Take 1 capsule (1,000 Units total) by mouth daily. 100 capsule 3  . clopidogrel (PLAVIX) 75 MG tablet Take 75 mg by mouth daily.    [name]. lisinopril (PRINIVIL,ZESTRIL) 5 MG tablet Take 1 tablet (5 mg total) by mouth daily. 90 tablet 3  . mirtazapine (REMERON) 15 MG tablet Take 1 tablet (15 mg total) by mouth at bedtime. 90 tablet 3  . NAMZARIC 28-10 MG CP24 TAKE 1 CAPSULE BY MOUTH DAILY 30 capsule 5  . nitroGLYCERIN (NITROSTAT) 0.4 MG SL tablet Place 1 tablet (0.4 mg total) under the tongue every 5 (five) minutes as needed for chest pain. 25 tablet 2  . Phosphatidylserine-DHA-EPA (VAYACOG) 100-19.5-6.5 MG CAPS Take 1 capsule by mouth daily. 30 capsule 11   No facility-administered medications prior to visit.     ROS Review of Systems  Constitutional: Negative for activity change, appetite change, chills, fatigue and unexpected weight change.  HENT: Negative for congestion, mouth sores and sinus pressure.   Eyes: Negative for visual disturbance.  Respiratory: Negative for cough and chest tightness.   Gastrointestinal: Negative for abdominal pain and nausea.  Genitourinary: Positive for enuresis. Negative  for difficulty urinating, frequency and vaginal pain.  Musculoskeletal: Negative for back pain and gait problem.  Skin: Negative for pallor and rash.  Neurological: Negative for dizziness, tremors, weakness, numbness and headaches.  Psychiatric/Behavioral: Positive for behavioral problems, confusion and decreased concentration. Negative for sleep disturbance and suicidal ideas. The patient is not nervous/anxious.     Objective:  BP 140/70   Pulse 68   Wt 126 lb (57.2 kg)   SpO2 96%   BMI 22.32 kg/m   BP Readings from Last 3 Encounters:  10/16/16 140/70  07/08/16 140/80  04/14/16 140/70    Wt Readings from Last 3 Encounters:  10/16/16 126 lb (57.2 kg)  07/08/16 126 lb (57.2 kg)  04/14/16 127 lb (57.6 kg)    Physical Exam  Constitutional: She appears well-developed. No distress.  HENT:  Head: Normocephalic.  Right Ear: External ear normal.  Left Ear: External ear normal.  Nose: Nose normal.  Mouth/Throat: Oropharynx is clear and moist.  Eyes: Conjunctivae are normal. Pupils are equal, round, and reactive to light. Right eye exhibits no discharge. Left eye exhibits no discharge.  Neck: Normal range of motion. Neck supple. No JVD present. No tracheal deviation present. No thyromegaly present.  Cardiovascular: Normal rate, regular rhythm and normal heart sounds.   Pulmonary/Chest: No stridor. No respiratory distress. She has no wheezes.  Abdominal: Soft. Bowel sounds are normal. She exhibits no distension and no mass. There is no tenderness. There is no rebound  and no guarding.  Musculoskeletal: She exhibits no edema or tenderness.  Lymphadenopathy:    She has no cervical adenopathy.  Neurological: She displays normal reflexes. No cranial nerve deficit. She exhibits normal muscle tone. Coordination normal.  Skin: No rash noted. No erythema.  Psychiatric: She has a normal mood and affect. Her behavior is normal.  Confused  Lab Results  Component Value Date   WBC 5.6  07/08/2016   HGB 13.5 07/08/2016   HCT 38.6 07/08/2016   PLT 192.0 07/08/2016   GLUCOSE 96 07/08/2016   CHOL 140 09/06/2015   TRIG 193 (H) 09/06/2015   HDL 41 (L) 09/06/2015   LDLCALC 60 09/06/2015   ALT 10 07/08/2016   AST 16 07/08/2016   NA 140 07/08/2016   K 4.3 07/08/2016   CL 106 07/08/2016   CREATININE 1.11 07/08/2016   BUN 20 07/08/2016   CO2 29 07/08/2016   TSH 3.42 07/08/2016   INR 0.97 03/07/2015    Dg Chest 2 View  Result Date: 03/13/2015 CLINICAL DATA:  82 year old female with chills and past history of pneumonia. Evaluate for recurrent pneumonia. EXAM: CHEST  2 VIEW COMPARISON:  Prior chest x-ray 05/26/2014 FINDINGS: Stable cardiomegaly. Atherosclerotic calcifications again noted in the transverse aorta. No evidence of focal airspace consolidation to suggest pneumonia. No pleural effusion, pneumothorax or pulmonary edema. No acute osseous abnormality. IMPRESSION: 1. Stable borderline cardiomegaly. 2. No evidence of airspace consolidation to suggest active pneumonia. Electronically Signed   By: Malachy MoanHeath  McCullough M.D.   On: 03/13/2015 16:58   Dg Lumbar Spine 2-3 Views  Result Date: 03/13/2015 CLINICAL DATA:  Chronic low back pain, no known injury EXAM: LUMBAR SPINE - 2-3 VIEW COMPARISON:  04/04/2013 FINDINGS: Five lumbar type vertebral bodies. Mild straightening of the lumbar spine. No evidence of fracture or dislocation. Vertebral body heights are maintained. Mild to moderate degenerative changes, most prominent at L5-S1. Visualized bony pelvis appears intact. IMPRESSION: Mild to moderate degenerative changes, most prominent at L5-S1. Electronically Signed   By: Charline BillsSriyesh  Krishnan M.D.   On: 03/13/2015 16:59    Assessment & Plan:   There are no diagnoses linked to this encounter. I am having [name] maintain her acetaminophen, nitroGLYCERIN, Cholecalciferol, clopidogrel, atorvastatin, lisinopril, mirtazapine, NAMZARIC, carvedilol, Phosphatidylserine-DHA-EPA, and  amLODipine.  No orders of the defined types were placed in this encounter.    Follow-up: No Follow-up on file.  Sonda PrimesAlex Plotnikov, MD

## 2016-10-16 NOTE — Assessment & Plan Note (Signed)
Chronic Tylenol prn 

## 2016-10-16 NOTE — Assessment & Plan Note (Signed)
Coreg, Amlodipine, Lisinopril

## 2016-10-16 NOTE — Assessment & Plan Note (Signed)
Worse  Namzaric  [name] will have to move to a Memory care unit appts most likely

## 2016-10-16 NOTE — Assessment & Plan Note (Signed)
Lipitor, Plavix, Coreg, Lipitor, Amlodipine

## 2016-11-25 ENCOUNTER — Other Ambulatory Visit: Payer: Self-pay | Admitting: Internal Medicine

## 2016-11-25 ENCOUNTER — Other Ambulatory Visit: Payer: Self-pay | Admitting: Cardiovascular Disease

## 2016-11-25 NOTE — Telephone Encounter (Signed)
clopidogrel (PLAVIX) 75 MG tablet  Medication  Date: 11/25/2016 [facility] [address] Office Ordering/Authorizing: Kathleene Hazelhristopher D McAlhany, MD  Order Providers   Prescribing Provider Encounter Provider  Kathleene Hazelhristopher D McAlhany, MD Kathleene Hazelhristopher D McAlhany, MD  Medication Detail    Disp Refills Start End   clopidogrel (PLAVIX) 75 MG tablet 30 tablet 0 11/25/2016    Sig: TAKE 1 TABLET BY MOUTH EVERY DAY   Notes to Pharmacy: PT DUE APPT FOR FURTHER REFILLS, CALL [phone] TO SCHEDULE, 1ST ATTEMPT   E-Prescribing Status: Receipt confirmed by pharmacy (11/25/2016 1:15 PM EST)   Pharmacy   [id number] - [place] - [address] [facility] [address]

## 2016-12-08 ENCOUNTER — Encounter: Payer: Self-pay | Admitting: Internal Medicine

## 2016-12-21 ENCOUNTER — Encounter: Payer: Self-pay | Admitting: Internal Medicine

## 2016-12-21 ENCOUNTER — Other Ambulatory Visit: Payer: Self-pay | Admitting: Internal Medicine

## 2016-12-21 DIAGNOSIS — G301 Alzheimer's disease with late onset: Principal | ICD-10-CM

## 2016-12-21 DIAGNOSIS — F0280 Dementia in other diseases classified elsewhere without behavioral disturbance: Secondary | ICD-10-CM

## 2016-12-23 ENCOUNTER — Telehealth: Payer: Self-pay | Admitting: Internal Medicine

## 2016-12-23 DIAGNOSIS — R5383 Other fatigue: Secondary | ICD-10-CM

## 2016-12-23 DIAGNOSIS — G301 Alzheimer's disease with late onset: Principal | ICD-10-CM

## 2016-12-23 DIAGNOSIS — F0281 Dementia in other diseases classified elsewhere with behavioral disturbance: Secondary | ICD-10-CM

## 2016-12-23 DIAGNOSIS — I10 Essential (primary) hypertension: Secondary | ICD-10-CM

## 2016-12-23 DIAGNOSIS — I2510 Atherosclerotic heart disease of native coronary artery without angina pectoris: Secondary | ICD-10-CM

## 2016-12-23 DIAGNOSIS — F02818 Dementia in other diseases classified elsewhere, unspecified severity, with other behavioral disturbance: Secondary | ICD-10-CM

## 2016-12-23 DIAGNOSIS — F411 Generalized anxiety disorder: Secondary | ICD-10-CM

## 2016-12-23 DIAGNOSIS — N3090 Cystitis, unspecified without hematuria: Secondary | ICD-10-CM

## 2016-12-23 NOTE — Telephone Encounter (Signed)
[facility] called in and needs some clearifacation on a order or referral that was sent over.  Can you call them when you get a chance ?     [phone] Timmothy EulerBrynn

## 2016-12-24 NOTE — Telephone Encounter (Signed)
[name] called from [facility], want to know if Dr. Macario GoldsPlot requesting nursing skill type of care or CNA? They do not provide CNA work, only Social research officer, governmentnursing skills. Please call her back  [phone]

## 2016-12-24 NOTE — Telephone Encounter (Signed)
Pls ask dtr [name] what she has arranged for Thx

## 2016-12-25 ENCOUNTER — Ambulatory Visit (INDEPENDENT_AMBULATORY_CARE_PROVIDER_SITE_OTHER): Payer: Medicare Other | Admitting: Cardiovascular Disease

## 2016-12-25 VITALS — BP 124/60 | HR 75 | Ht 63.0 in | Wt 124.8 lb

## 2016-12-25 DIAGNOSIS — I340 Nonrheumatic mitral (valve) insufficiency: Secondary | ICD-10-CM

## 2016-12-25 DIAGNOSIS — I10 Essential (primary) hypertension: Secondary | ICD-10-CM

## 2016-12-25 DIAGNOSIS — E7800 Pure hypercholesterolemia, unspecified: Secondary | ICD-10-CM | POA: Diagnosis not present

## 2016-12-25 DIAGNOSIS — I2510 Atherosclerotic heart disease of native coronary artery without angina pectoris: Secondary | ICD-10-CM | POA: Diagnosis not present

## 2016-12-25 NOTE — Patient Instructions (Signed)

## 2016-12-25 NOTE — Progress Notes (Signed)
Chief Complaint  Patient presents with  . Follow-up    1 year      History of Present Illness: 83 y.o. female with history of CAD s/p PTCA to LAD in 2002, PCI/DES mid LAD and mid to distal RCA in 2015, ischemic CM with normal EF now, HTN, HLD here today for cardiac follow up. She has been followed in the past by Dr. Daleen Squibb. She was admitted 6/27-6/30/15 with a non-STEMI. Cardiac catheterization demonstrated high-grade disease in the mid LAD and mid to distal RCA. She underwent PCI with placement of a DES in the mid LAD and DES in the distal RCA. Of note, echocardiogram did demonstrate an EF of 50% with moderate to severe mitral regurgitation suspicious for ischemic mitral regurgitation and papillary muscle dysfunction. She was seen by Tereso Newcomer, PA-C for hospital f/u and was feeling better. Echo 07/26/14 with normal LV function and mild MR.   She is here today for f/u. She denies chest pain or SOB. No orthopnea, PND or edema. She denies syncope.   Primary Care Physician: Sonda Primes, MD  Past Medical History:  Diagnosis Date  . ANXIETY   . CORONARY ARTERY DISEASE    a. 11/2000 Cath/PTCA: LM 20d, LAD 99p (PTCA), LCX 70p, RCA dom, 59m, EF 30 dist ant AK;  b. 2008 Myoview: EF 75%, small ant apical scar, mild peri-infarct ischemia.  [name] DEMENTIA   . Dementia   . DEPRESSION   . DIVERTICULOSIS, COLON   . HERPES ZOSTER   . History of Ischemic Cardiomyopathy    a. 11/2000 LV gram: EF 30%, dist ant AK;  b. 2008 MV EF 75%.  [name] Hx of echocardiogram    Echo (8/15):  EF 55%, mild apical HK, Gr 1 DD, mild AI, mild MR, severe LAE, mod RAE, mod PI, PASP 38 mmHg  . HYPERLIPIDEMIA   . HYPERTENSION   . OSTEOPOROSIS   . VERTIGO     Past Surgical History:  Procedure Laterality Date  . LEFT HEART CATHETERIZATION WITH CORONARY ANGIOGRAM N/A 05/28/2014   Procedure: LEFT HEART CATHETERIZATION WITH CORONARY ANGIOGRAM;  Surgeon: Kathleene Hazel, MD;  Location: [facility] CATH LAB;  Service:  Cardiovascular;  Laterality: N/A;  . PERCUTANEOUS CORONARY STENT INTERVENTION (PCI-S)  05/28/2014   Procedure: PERCUTANEOUS CORONARY STENT INTERVENTION (PCI-S);  Surgeon: Kathleene Hazel, MD;  Location: [facility] CATH LAB;  Service: Cardiovascular;;  . PTCA  12/09/2000   successful; of the proximal left anterior descending with reduction of 99% narrowing to 30% with improvement of TIMI grade 1 to TIMI grade 3 flow  . TONSILLECTOMY AND ADENOIDECTOMY  1943    Current Outpatient Prescriptions  Medication Sig Dispense Refill  . acetaminophen (TYLENOL) 325 MG tablet Take 2 tablets (650 mg total) by mouth every 4 (four) hours as needed for headache or mild pain.    [name] amLODipine (NORVASC) 5 MG tablet TAKE 1/2 TABLET BY MOUTH ONCE DAILY 45 tablet 2  . atorvastatin (LIPITOR) 20 MG tablet Take 1 tablet (20 mg total) by mouth daily. 90 tablet 3  . carvedilol (COREG) 12.5 MG tablet TAKE 1 TABLET BY MOUTH TWICE DAILY WITH A MEAL 60 tablet 5  . Cholecalciferol ([employer]S VITAMIN D3) 1000 UNITS capsule Take 1 capsule (1,000 Units total) by mouth daily. 100 capsule 3  . clopidogrel (PLAVIX) 75 MG tablet Take 75 mg by mouth daily.    [name] lisinopril (PRINIVIL,ZESTRIL) 5 MG tablet Take 1 tablet (5 mg total) by mouth daily. 90 tablet 3  .  mirtazapine (REMERON) 15 MG tablet Take 1 tablet (15 mg total) by mouth at bedtime. 90 tablet 3  . NAMZARIC 28-10 MG CP24 TAKE 1 CAPSULE BY MOUTH DAILY 30 capsule 2  . nitroGLYCERIN (NITROSTAT) 0.4 MG SL tablet Place 1 tablet (0.4 mg total) under the tongue every 5 (five) minutes as needed for chest pain. 25 tablet 2   No current facility-administered medications for this visit.     Allergies  Allergen Reactions  . Simvastatin Other (See Comments)    REACTION: memory problem, myalgia    Social History   Social History  . Marital status: Widowed    Spouse name: N/A  . Number of children: N/A  . Years of education: N/A   Occupational History  .  Retired    Retired    Social History Main Topics  . Smoking status: Never Smoker  . Smokeless tobacco: Never Used  . Alcohol use No  . Drug use: No  . Sexual activity: No   Other Topics Concern  . Not on file   Social History Narrative   Regular exercise-yes    Family History  Problem Relation Age of Onset  . Hypertension Other   . Heart disease Other   . Coronary artery disease Other   . Diabetes Mother   . Heart attack Mother   . Hypertension Daughter   . Sudden death Mother     Review of Systems:  As stated in the HPI and otherwise negative.   BP 124/60   Pulse 75   Ht 5\' 3"  (1.6 m)   Wt 124 lb 12.8 oz (56.6 kg)   BMI 22.11 kg/m   Physical Examination: General: Well developed, well nourished, NAD  HEENT: OP clear, mucus membranes moist  SKIN: warm, dry. No rashes. Neuro: No focal deficits  Musculoskeletal: Muscle strength 5/5 all ext  Psychiatric: Mood and affect normal  Neck: No JVD, no carotid bruits, no thyromegaly, no lymphadenopathy.  Lungs:Clear bilaterally, no wheezes, rhonci, crackles Cardiovascular: Regular rate and rhythm. No murmurs, gallops or rubs. Abdomen:Soft. Bowel sounds present. Non-tender.  Extremities: No lower extremity edema. Pulses are 2 + in the bilateral DP/PT.  LHC (05/28/14): Distal left main 20%, proximal LAD 30%, mid LAD 99%, OM 50-60, proximal RCA 30%, mid RCA 80%/95%. >>> PCI: 2.5 x 15 mm Xience DES in the mid LAD and 2.5 x 38 mm Xience DES in distal RCA  Left main: 20% distal stenosis.  Left Anterior Descending Artery: Large caliber vessel that courses to the apex. The proximal vessel has diffuse 30% stenosis. The mid vessel has a focal 99% stenosis. There are several small caliber diagonal branches.  Circumflex Artery: Large caliber vessel that terminates into a large caliber obtuse marginal branch. The OM branch has a focal 50-60% stenosis.  Right Coronary Artery: Large dominant vessel with 30% proximal stenosis, 80% mid stenosis followed by serial  95% stenoses in the distal vessel. The posterolateral branch and PDA are moderate in caliber and patent with mild diffuse disease.   Echo 07/26/14: Left ventricle: LVEF is approximately 55% with mild apical hypokinesis. The cavity size was normal. Wall thickness was normal. Doppler parameters are consistent with abnormal left ventricular relaxation (grade 1 diastolic dysfunction). - Aortic valve: There was mild regurgitation. - Mitral valve: There was mild regurgitation. - Left atrium: The atrium was severely dilated. - Right atrium: The atrium was moderately dilated. - Pulmonic valve: There was moderate regurgitation. - Pulmonary arteries: PA peak pressure: 38 mm Hg (S).  EKG:  EKG is ordered today. The ekg ordered today demonstrates NSR, rate 75 bpm. Non-specific T wave abn  Recent Labs: 07/08/2016: ALT 10; BUN 20; Creatinine, Ser 1.11; Hemoglobin 13.5; Platelets 192.0; Potassium 4.3; Sodium 140; TSH 3.42   Lipid Panel    Component Value Date/Time   CHOL 140 09/06/2015 1052   TRIG 193 (H) 09/06/2015 1052   HDL 41 (L) 09/06/2015 1052   CHOLHDL 3.4 09/06/2015 1052   VLDL 39 (H) 09/06/2015 1052   LDLCALC 60 09/06/2015 1052     Wt Readings from Last 3 Encounters:  12/25/16 124 lb 12.8 oz (56.6 kg)  10/16/16 126 lb (57.2 kg)  07/08/16 126 lb (57.2 kg)     Other studies Reviewed: Additional studies/ records that were reviewed today include: . Review of the above records demonstrates:    Assessment and Plan:   1. Coronary artery disease without angina: Stable. No angina. Continue Plavix, beta blocker, statin.      2. Mitral regurgitation: Mild by echo 07/26/14. Will follow.   3. Hyperlipidemia: Lipids well controlled. Continue statin. LFTs normal August 2017.  4. Essential hypertension: BP controlled. No changes.    Current medicines are reviewed at length with the patient today.  The patient does not have concerns regarding medicines.  The following changes have been  made:  no change  Labs/ tests ordered today include:   Orders Placed This Encounter  Procedures  . EKG 12-Lead    Disposition:   FU with me in 12  months  Signed, Verne Carrow, MD 12/25/2016 5:03 PM    [facility] [address] [place], [place], [place]  [address] Phone: [phone]; Fax: [phone]

## 2016-12-29 NOTE — Telephone Encounter (Signed)
[name] [name], [name] need help with getting dress,bath and making sure she take her medication. Please advise

## 2016-12-30 NOTE — Telephone Encounter (Signed)
Can you put a new Home Health referral in for social work and med [employer]

## 2016-12-30 NOTE — Telephone Encounter (Signed)
Home health enter, Please check phone note before send to certain home health.

## 2016-12-30 NOTE — Telephone Encounter (Signed)
Done on 1/22 Thx

## 2016-12-31 NOTE — Telephone Encounter (Signed)
[facility]

## 2017-01-01 ENCOUNTER — Telehealth: Payer: Self-pay | Admitting: Internal Medicine

## 2017-01-01 NOTE — Telephone Encounter (Signed)
Keith,[facility] called want to let Dr. Macario GoldsPlot know of 48 hours delay start of care for [name]. But she ask for a call back.

## 2017-01-01 NOTE — Telephone Encounter (Signed)
Noted. Thx.

## 2017-01-04 ENCOUNTER — Telehealth: Payer: Self-pay | Admitting: Emergency Medicine

## 2017-01-04 DIAGNOSIS — M545 Low back pain: Secondary | ICD-10-CM | POA: Diagnosis not present

## 2017-01-04 DIAGNOSIS — I2510 Atherosclerotic heart disease of native coronary artery without angina pectoris: Secondary | ICD-10-CM | POA: Diagnosis not present

## 2017-01-04 DIAGNOSIS — I10 Essential (primary) hypertension: Secondary | ICD-10-CM | POA: Diagnosis not present

## 2017-01-04 DIAGNOSIS — G301 Alzheimer's disease with late onset: Secondary | ICD-10-CM | POA: Diagnosis not present

## 2017-01-04 DIAGNOSIS — Z9181 History of falling: Secondary | ICD-10-CM | POA: Diagnosis not present

## 2017-01-04 DIAGNOSIS — Z7902 Long term (current) use of antithrombotics/antiplatelets: Secondary | ICD-10-CM | POA: Diagnosis not present

## 2017-01-04 DIAGNOSIS — G8929 Other chronic pain: Secondary | ICD-10-CM | POA: Diagnosis not present

## 2017-01-04 NOTE — Telephone Encounter (Signed)
[facility] called and pt was delayed on being seen but was requesting to be seen today. Is that ok to go out? Please advise thanks.

## 2017-01-04 NOTE — Telephone Encounter (Signed)
Thx

## 2017-01-04 NOTE — Telephone Encounter (Signed)
[facility] went out to see patient today for skilled nursing, social worker for community resources, home health aid for ADL's and PT to evaluate since there has been a fall in the last 3 months. Thanks.

## 2017-01-06 DIAGNOSIS — Z9181 History of falling: Secondary | ICD-10-CM | POA: Diagnosis not present

## 2017-01-06 DIAGNOSIS — Z7902 Long term (current) use of antithrombotics/antiplatelets: Secondary | ICD-10-CM | POA: Diagnosis not present

## 2017-01-06 DIAGNOSIS — I2510 Atherosclerotic heart disease of native coronary artery without angina pectoris: Secondary | ICD-10-CM | POA: Diagnosis not present

## 2017-01-06 DIAGNOSIS — I10 Essential (primary) hypertension: Secondary | ICD-10-CM | POA: Diagnosis not present

## 2017-01-06 DIAGNOSIS — G301 Alzheimer's disease with late onset: Secondary | ICD-10-CM | POA: Diagnosis not present

## 2017-01-06 DIAGNOSIS — M545 Low back pain: Secondary | ICD-10-CM | POA: Diagnosis not present

## 2017-01-06 DIAGNOSIS — G8929 Other chronic pain: Secondary | ICD-10-CM | POA: Diagnosis not present

## 2017-01-08 ENCOUNTER — Other Ambulatory Visit: Payer: Self-pay | Admitting: Internal Medicine

## 2017-01-08 DIAGNOSIS — Z9181 History of falling: Secondary | ICD-10-CM | POA: Diagnosis not present

## 2017-01-08 DIAGNOSIS — I10 Essential (primary) hypertension: Secondary | ICD-10-CM | POA: Diagnosis not present

## 2017-01-08 DIAGNOSIS — I2510 Atherosclerotic heart disease of native coronary artery without angina pectoris: Secondary | ICD-10-CM | POA: Diagnosis not present

## 2017-01-08 DIAGNOSIS — Z7902 Long term (current) use of antithrombotics/antiplatelets: Secondary | ICD-10-CM | POA: Diagnosis not present

## 2017-01-08 DIAGNOSIS — G301 Alzheimer's disease with late onset: Secondary | ICD-10-CM | POA: Diagnosis not present

## 2017-01-08 DIAGNOSIS — G8929 Other chronic pain: Secondary | ICD-10-CM | POA: Diagnosis not present

## 2017-01-08 DIAGNOSIS — M545 Low back pain: Secondary | ICD-10-CM | POA: Diagnosis not present

## 2017-01-11 DIAGNOSIS — M545 Low back pain: Secondary | ICD-10-CM | POA: Diagnosis not present

## 2017-01-11 DIAGNOSIS — G301 Alzheimer's disease with late onset: Secondary | ICD-10-CM | POA: Diagnosis not present

## 2017-01-11 DIAGNOSIS — I2510 Atherosclerotic heart disease of native coronary artery without angina pectoris: Secondary | ICD-10-CM | POA: Diagnosis not present

## 2017-01-11 DIAGNOSIS — I10 Essential (primary) hypertension: Secondary | ICD-10-CM | POA: Diagnosis not present

## 2017-01-11 DIAGNOSIS — G8929 Other chronic pain: Secondary | ICD-10-CM | POA: Diagnosis not present

## 2017-01-11 DIAGNOSIS — Z7902 Long term (current) use of antithrombotics/antiplatelets: Secondary | ICD-10-CM | POA: Diagnosis not present

## 2017-01-11 DIAGNOSIS — Z9181 History of falling: Secondary | ICD-10-CM | POA: Diagnosis not present

## 2017-01-12 DIAGNOSIS — G301 Alzheimer's disease with late onset: Secondary | ICD-10-CM | POA: Diagnosis not present

## 2017-01-12 DIAGNOSIS — Z9181 History of falling: Secondary | ICD-10-CM | POA: Diagnosis not present

## 2017-01-12 DIAGNOSIS — M545 Low back pain: Secondary | ICD-10-CM | POA: Diagnosis not present

## 2017-01-12 DIAGNOSIS — Z7902 Long term (current) use of antithrombotics/antiplatelets: Secondary | ICD-10-CM | POA: Diagnosis not present

## 2017-01-12 DIAGNOSIS — I2510 Atherosclerotic heart disease of native coronary artery without angina pectoris: Secondary | ICD-10-CM | POA: Diagnosis not present

## 2017-01-12 DIAGNOSIS — I10 Essential (primary) hypertension: Secondary | ICD-10-CM | POA: Diagnosis not present

## 2017-01-12 DIAGNOSIS — G8929 Other chronic pain: Secondary | ICD-10-CM | POA: Diagnosis not present

## 2017-01-13 DIAGNOSIS — M545 Low back pain: Secondary | ICD-10-CM | POA: Diagnosis not present

## 2017-01-13 DIAGNOSIS — I10 Essential (primary) hypertension: Secondary | ICD-10-CM | POA: Diagnosis not present

## 2017-01-13 DIAGNOSIS — Z7902 Long term (current) use of antithrombotics/antiplatelets: Secondary | ICD-10-CM | POA: Diagnosis not present

## 2017-01-13 DIAGNOSIS — I2510 Atherosclerotic heart disease of native coronary artery without angina pectoris: Secondary | ICD-10-CM | POA: Diagnosis not present

## 2017-01-13 DIAGNOSIS — Z9181 History of falling: Secondary | ICD-10-CM | POA: Diagnosis not present

## 2017-01-13 DIAGNOSIS — G8929 Other chronic pain: Secondary | ICD-10-CM | POA: Diagnosis not present

## 2017-01-13 DIAGNOSIS — G301 Alzheimer's disease with late onset: Secondary | ICD-10-CM | POA: Diagnosis not present

## 2017-01-14 DIAGNOSIS — M545 Low back pain: Secondary | ICD-10-CM | POA: Diagnosis not present

## 2017-01-14 DIAGNOSIS — G8929 Other chronic pain: Secondary | ICD-10-CM | POA: Diagnosis not present

## 2017-01-14 DIAGNOSIS — G301 Alzheimer's disease with late onset: Secondary | ICD-10-CM | POA: Diagnosis not present

## 2017-01-14 DIAGNOSIS — I10 Essential (primary) hypertension: Secondary | ICD-10-CM | POA: Diagnosis not present

## 2017-01-14 DIAGNOSIS — Z7902 Long term (current) use of antithrombotics/antiplatelets: Secondary | ICD-10-CM | POA: Diagnosis not present

## 2017-01-14 DIAGNOSIS — Z9181 History of falling: Secondary | ICD-10-CM | POA: Diagnosis not present

## 2017-01-14 DIAGNOSIS — I2510 Atherosclerotic heart disease of native coronary artery without angina pectoris: Secondary | ICD-10-CM | POA: Diagnosis not present

## 2017-01-18 DIAGNOSIS — Z7902 Long term (current) use of antithrombotics/antiplatelets: Secondary | ICD-10-CM | POA: Diagnosis not present

## 2017-01-18 DIAGNOSIS — M545 Low back pain: Secondary | ICD-10-CM | POA: Diagnosis not present

## 2017-01-18 DIAGNOSIS — Z9181 History of falling: Secondary | ICD-10-CM | POA: Diagnosis not present

## 2017-01-18 DIAGNOSIS — G301 Alzheimer's disease with late onset: Secondary | ICD-10-CM | POA: Diagnosis not present

## 2017-01-18 DIAGNOSIS — I10 Essential (primary) hypertension: Secondary | ICD-10-CM | POA: Diagnosis not present

## 2017-01-18 DIAGNOSIS — I2510 Atherosclerotic heart disease of native coronary artery without angina pectoris: Secondary | ICD-10-CM | POA: Diagnosis not present

## 2017-01-18 DIAGNOSIS — G8929 Other chronic pain: Secondary | ICD-10-CM | POA: Diagnosis not present

## 2017-01-19 DIAGNOSIS — G301 Alzheimer's disease with late onset: Secondary | ICD-10-CM | POA: Diagnosis not present

## 2017-01-19 DIAGNOSIS — Z7902 Long term (current) use of antithrombotics/antiplatelets: Secondary | ICD-10-CM | POA: Diagnosis not present

## 2017-01-19 DIAGNOSIS — I2510 Atherosclerotic heart disease of native coronary artery without angina pectoris: Secondary | ICD-10-CM | POA: Diagnosis not present

## 2017-01-19 DIAGNOSIS — G8929 Other chronic pain: Secondary | ICD-10-CM | POA: Diagnosis not present

## 2017-01-19 DIAGNOSIS — Z9181 History of falling: Secondary | ICD-10-CM | POA: Diagnosis not present

## 2017-01-19 DIAGNOSIS — I10 Essential (primary) hypertension: Secondary | ICD-10-CM | POA: Diagnosis not present

## 2017-01-19 DIAGNOSIS — M545 Low back pain: Secondary | ICD-10-CM | POA: Diagnosis not present

## 2017-01-19 NOTE — Progress Notes (Signed)
Subjective:   [name] is a 83 y.o. female who presents for Medicare Annual (Subsequent) preventive examination.  Review of Systems:  No ROS.  Medicare Wellness Visit.  Cardiac Risk Factors include: advanced age (>9men, >46 women);family history of premature cardiovascular disease;dyslipidemia;hypertension Sleep patterns: no sleep issues, feels rested on waking, does not get up to void and sleeps 12 hours nightly.   Home Safety/Smoke Alarms:  Feels safe in home. Smoke alarms in place.  Living environment; residence and Firearm Safety: 2-story house, firearms stored safely. Lives with daughter Seat Belt Safety/Bike Helmet: Wears seat belt.   Counseling:   Eye Exam- goes yearly Dental- goes yearly  Female:   Pap-  N/A     Mammo-  N/A     Dexa scan- N/A       CCS- N/A       Objective:     Vitals: BP 110/60   Pulse 65   Temp 97.8 F (36.6 C) (Oral)   Resp 16   Ht 5\' 3"  (1.6 m)   Wt 128 lb 8 oz (58.3 kg)   SpO2 98%   BMI 22.76 kg/m   Body mass index is 22.76 kg/m.   Tobacco History  Smoking Status  . Never Smoker  Smokeless Tobacco  . Never Used     Counseling given: Not Answered   Past Medical History:  Diagnosis Date  . ANXIETY   . CORONARY ARTERY DISEASE    a. 11/2000 Cath/PTCA: LM 20d, LAD 99p (PTCA), LCX 70p, RCA dom, 12m, EF 30 dist ant AK;  b. 2008 Myoview: EF 75%, small ant apical scar, mild peri-infarct ischemia.  [name] DEMENTIA   . Dementia   . DEPRESSION   . DIVERTICULOSIS, COLON   . HERPES ZOSTER   . History of Ischemic Cardiomyopathy    a. 11/2000 LV gram: EF 30%, dist ant AK;  b. 2008 MV EF 75%.  [name] Hx of echocardiogram    Echo (8/15):  EF 55%, mild apical HK, Gr 1 DD, mild AI, mild MR, severe LAE, mod RAE, mod PI, PASP 38 mmHg  . HYPERLIPIDEMIA   . HYPERTENSION   . OSTEOPOROSIS   . VERTIGO    Past Surgical History:  Procedure Laterality Date  . LEFT HEART CATHETERIZATION WITH CORONARY ANGIOGRAM N/A 05/28/2014   Procedure: LEFT HEART  CATHETERIZATION WITH CORONARY ANGIOGRAM;  Surgeon: Kathleene Hazel, MD;  Location: [facility] CATH LAB;  Service: Cardiovascular;  Laterality: N/A;  . PERCUTANEOUS CORONARY STENT INTERVENTION (PCI-S)  05/28/2014   Procedure: PERCUTANEOUS CORONARY STENT INTERVENTION (PCI-S);  Surgeon: Kathleene Hazel, MD;  Location: [facility] CATH LAB;  Service: Cardiovascular;;  . PTCA  12/09/2000   successful; of the proximal left anterior descending with reduction of 99% narrowing to 30% with improvement of TIMI grade 1 to TIMI grade 3 flow  . TONSILLECTOMY AND ADENOIDECTOMY  1943   Family History  Problem Relation Age of Onset  . Diabetes Mother   . Heart attack Mother   . Sudden death Mother   . Hypertension Other   . Heart disease Other   . Coronary artery disease Other   . Hypertension Daughter    History  Sexual Activity  . Sexual activity: No    Outpatient Encounter Prescriptions as of 01/20/2017  Medication Sig  . acetaminophen (TYLENOL) 325 MG tablet Take 2 tablets (650 mg total) by mouth every 4 (four) hours as needed for headache or mild pain.  [name] amLODipine (NORVASC) 5 MG tablet TAKE  1/2 TABLET BY MOUTH ONCE DAILY  . atorvastatin (LIPITOR) 20 MG tablet TAKE 1 TABLET BY MOUTH DAILY  . carvedilol (COREG) 12.5 MG tablet TAKE 1 TABLET BY MOUTH TWICE DAILY WITH A MEAL  . Cholecalciferol ([employer]S VITAMIN D3) 1000 UNITS capsule Take 1 capsule (1,000 Units total) by mouth daily.  . clopidogrel (PLAVIX) 75 MG tablet Take 75 mg by mouth daily.  [name]. lisinopril (PRINIVIL,ZESTRIL) 5 MG tablet Take 1 tablet (5 mg total) by mouth daily.  . mirtazapine (REMERON) 15 MG tablet Take 1 tablet (15 mg total) by mouth at bedtime.  [name]. NAMZARIC 28-10 MG CP24 TAKE 1 CAPSULE BY MOUTH DAILY  . nitroGLYCERIN (NITROSTAT) 0.4 MG SL tablet Place 1 tablet (0.4 mg total) under the tongue every 5 (five) minutes as needed for chest pain.   No facility-administered encounter medications on file as of 01/20/2017.     Activities of  Daily Living In your present state of health, do you have any difficulty performing the following activities: 01/20/2017  Hearing? N  Vision? N  Difficulty concentrating or making decisions? Y  Walking or climbing stairs? N  Dressing or bathing? N  Doing errands, shopping? N  Preparing Food and eating ? Y  Using the Toilet? Y  In the past six months, have you accidently leaked urine? Y  Do you have problems with loss of bowel control? N  Managing your Medications? N  Managing your Finances? N  Housekeeping or managing your Housekeeping? N  Some recent data might be hidden    Patient Care Team: Tresa GarterAleksei Plotnikov V, MD as PCP - General Kathleene Hazelhristopher D McAlhany, MD as Consulting Physician (Cardiology)    Assessment:    Physical assessment deferred to PCP.  Exercise Activities and Dietary recommendations Current Exercise Habits: The patient does not participate in regular exercise at present ([facility]), Exercise limited by: Other - see comments (dementia)  Diet (meal preparation, eat out, water intake, caffeinated beverages, dairy products, fruits and vegetables): Eats a variety of foods. Frequently skips meals.  Discussed high protein ensure supplements and encouraged patient to increase water intake.  Goals    . Exercise 3x per week (30 min per time)          Will continue work with physical therapy and will walk.      Fall Risk Fall Risk  01/20/2017 04/14/2016 03/13/2015 12/19/2014  Falls in the past year? No No No No   Depression Screen PHQ 2/9 Scores 01/20/2017 04/14/2016 03/13/2015  PHQ - 2 Score 0 0 0     Cognitive Function        Immunization History  Administered Date(s) Administered  . Influenza Split 08/30/2012  . Influenza Whole 11/16/2006, 08/27/2008, 08/22/2009, 08/13/2010, 08/01/2011  . Influenza, High Dose Seasonal PF 09/12/2013, 09/22/2016  . Influenza,inj,Quad PF,36+ Mos 09/11/2014, 08/14/2015  . Pneumococcal Conjugate-13 12/19/2014  .  Pneumococcal Polysaccharide-23 05/13/2005  . Td 05/11/2013  . Zoster 06/04/2008   Screening Tests Health Maintenance  Topic Date Due  . TETANUS/TDAP  05/12/2023  . INFLUENZA VACCINE  Completed  . DEXA SCAN  Completed  . PNA vac Low Risk Adult  Completed      Plan:     Continue doing brain stimulating activities (puzzles, reading, adult coloring books, staying active) to keep memory sharp.   Try ensure high protein as a meal supplement and increase your water intake at least 2 bottles per day.  Discuss with physical therapist regarding any needed medical equipment  During  the course of the visit the patient was educated and counseled about the following appropriate screening and preventive services:   Vaccines to include Pneumoccal, Influenza, Hepatitis B, Td, Zostavax, HCV  Cardiovascular Disease  Colorectal cancer screening  Bone density screening  Diabetes screening  Glaucoma screening  Mammography/PAP  Nutrition counseling   Patient Instructions (the written plan) was given to the patient.   Wanda Plump, RN  01/20/2017  Medical screening examination/treatment/procedure(s) were performed by non-physician practitioner and as supervising physician I was immediately available for consultation/collaboration. I agree with above. Sonda Primes, MD

## 2017-01-19 NOTE — Progress Notes (Signed)
Pre visit review using our clinic review tool, if applicable. No additional management support is needed unless otherwise documented below in the visit note. 

## 2017-01-20 ENCOUNTER — Ambulatory Visit (INDEPENDENT_AMBULATORY_CARE_PROVIDER_SITE_OTHER): Payer: Medicare Other | Admitting: Internal Medicine

## 2017-01-20 ENCOUNTER — Encounter: Payer: Self-pay | Admitting: Internal Medicine

## 2017-01-20 VITALS — BP 110/60 | HR 65 | Temp 97.8°F | Resp 16 | Ht 63.0 in | Wt 128.5 lb

## 2017-01-20 DIAGNOSIS — I10 Essential (primary) hypertension: Secondary | ICD-10-CM

## 2017-01-20 DIAGNOSIS — F02818 Dementia in other diseases classified elsewhere, unspecified severity, with other behavioral disturbance: Secondary | ICD-10-CM

## 2017-01-20 DIAGNOSIS — G301 Alzheimer's disease with late onset: Secondary | ICD-10-CM | POA: Diagnosis not present

## 2017-01-20 DIAGNOSIS — F4321 Adjustment disorder with depressed mood: Secondary | ICD-10-CM | POA: Diagnosis not present

## 2017-01-20 DIAGNOSIS — Z0000 Encounter for general adult medical examination without abnormal findings: Secondary | ICD-10-CM

## 2017-01-20 DIAGNOSIS — F0281 Dementia in other diseases classified elsewhere with behavioral disturbance: Secondary | ICD-10-CM

## 2017-01-20 NOTE — Progress Notes (Signed)
Pre-visit discussion using our clinic review tool. No additional management support is needed unless otherwise documented below in the visit note.  

## 2017-01-20 NOTE — Patient Instructions (Addendum)
Continue doing brain stimulating activities (puzzles, reading, adult coloring books, staying active) to keep memory sharp.   Try ensure high protein as a meal supplement and increase your water intake at least 2 bottles per day.  Discuss with physical therapist regarding any needed medical equipment Noreene [email or web address]  [name] , Thank you for taking time to come for your Medicare Wellness Visit. I appreciate your ongoing commitment to your health goals. Please review the following plan we discussed and let me know if I can assist you in the future.   These are the goals we discussed: Goals    . Exercise 3x per week (30 min per time)          Will continue work with physical therapy and will walk.       This is a list of the screening recommended for you and due dates:  Health Maintenance  Topic Date Due  . Tetanus Vaccine  05/12/2023  . Flu Shot  Completed  . DEXA scan (bone density measurement)  Completed  . Pneumonia vaccines  Completed

## 2017-01-20 NOTE — Assessment & Plan Note (Signed)
On Remeron 

## 2017-01-20 NOTE — Progress Notes (Signed)
Subjective:  Patient ID: [name], female    DOB: 1926/12/05  Age: 83 y.o. MRN: [id number]  CC: Annual Exam   HPI Eileen KnackRuby M Milroy presents for dementia, gait imbalance, dyslipidemia f/u  Outpatient Medications Prior to Visit  Medication Sig Dispense Refill  . acetaminophen (TYLENOL) 325 MG tablet Take 2 tablets (650 mg total) by mouth every 4 (four) hours as needed for headache or mild pain.    [name]. amLODipine (NORVASC) 5 MG tablet TAKE 1/2 TABLET BY MOUTH ONCE DAILY 45 tablet 2  . atorvastatin (LIPITOR) 20 MG tablet TAKE 1 TABLET BY MOUTH DAILY 90 tablet 2  . carvedilol (COREG) 12.5 MG tablet TAKE 1 TABLET BY MOUTH TWICE DAILY WITH A MEAL 60 tablet 5  . Cholecalciferol ([employer]S VITAMIN D3) 1000 UNITS capsule Take 1 capsule (1,000 Units total) by mouth daily. 100 capsule 3  . clopidogrel (PLAVIX) 75 MG tablet Take 75 mg by mouth daily.    [name]. lisinopril (PRINIVIL,ZESTRIL) 5 MG tablet Take 1 tablet (5 mg total) by mouth daily. 90 tablet 3  . mirtazapine (REMERON) 15 MG tablet Take 1 tablet (15 mg total) by mouth at bedtime. 90 tablet 3  . NAMZARIC 28-10 MG CP24 TAKE 1 CAPSULE BY MOUTH DAILY 30 capsule 2  . nitroGLYCERIN (NITROSTAT) 0.4 MG SL tablet Place 1 tablet (0.4 mg total) under the tongue every 5 (five) minutes as needed for chest pain. 25 tablet 2   No facility-administered medications prior to visit.     ROS Review of Systems  Constitutional: Negative for activity change, appetite change, chills, fatigue and unexpected weight change.  HENT: Negative for congestion, mouth sores and sinus pressure.   Eyes: Negative for visual disturbance.  Respiratory: Negative for cough and chest tightness.   Gastrointestinal: Negative for abdominal pain and nausea.  Genitourinary: Negative for difficulty urinating, frequency and vaginal pain.  Musculoskeletal: Negative for back pain and gait problem.  Skin: Negative for pallor and rash.  Neurological: Negative for dizziness, tremors, weakness,  numbness and headaches.  Psychiatric/Behavioral: Positive for behavioral problems, confusion and decreased concentration. Negative for sleep disturbance and suicidal ideas. The patient is nervous/anxious.     Objective:  BP 110/60   Pulse 65   Temp 97.8 F (36.6 C) (Oral)   Resp 16   Ht 5\' 3"  (1.6 m)   Wt 128 lb 8 oz (58.3 kg)   SpO2 98%   BMI 22.76 kg/m   BP Readings from Last 3 Encounters:  01/20/17 110/60  12/25/16 124/60  10/16/16 140/70    Wt Readings from Last 3 Encounters:  01/20/17 128 lb 8 oz (58.3 kg)  12/25/16 124 lb 12.8 oz (56.6 kg)  10/16/16 126 lb (57.2 kg)    Physical Exam  Constitutional: She appears well-developed. No distress.  HENT:  Head: Normocephalic.  Right Ear: External ear normal.  Left Ear: External ear normal.  Nose: Nose normal.  Mouth/Throat: Oropharynx is clear and moist.  Eyes: Conjunctivae are normal. Pupils are equal, round, and reactive to light. Right eye exhibits no discharge. Left eye exhibits no discharge.  Neck: Normal range of motion. Neck supple. No JVD present. No tracheal deviation present. No thyromegaly present.  Cardiovascular: Normal rate, regular rhythm and normal heart sounds.   Pulmonary/Chest: No stridor. No respiratory distress. She has no wheezes.  Abdominal: Soft. Bowel sounds are normal. She exhibits no distension and no mass. There is no tenderness. There is no rebound and no guarding.  Musculoskeletal: She exhibits no  edema or tenderness.  Lymphadenopathy:    She has no cervical adenopathy.  Neurological: She displays normal reflexes. No cranial nerve deficit. She exhibits normal muscle tone. Coordination abnormal.  Skin: No rash noted. No erythema.  Psychiatric: She has a normal mood and affect. Her behavior is normal.    Lab Results  Component Value Date   WBC 5.6 07/08/2016   HGB 13.5 07/08/2016   HCT 38.6 07/08/2016   PLT 192.0 07/08/2016   GLUCOSE 96 07/08/2016   CHOL 140 09/06/2015   TRIG 193 (H)  09/06/2015   HDL 41 (L) 09/06/2015   LDLCALC 60 09/06/2015   ALT 10 07/08/2016   AST 16 07/08/2016   NA 140 07/08/2016   K 4.3 07/08/2016   CL 106 07/08/2016   CREATININE 1.11 07/08/2016   BUN 20 07/08/2016   CO2 29 07/08/2016   TSH 3.42 07/08/2016   INR 0.97 03/07/2015    Dg Chest 2 View  Result Date: 03/13/2015 CLINICAL DATA:  83 year old female with chills and past history of pneumonia. Evaluate for recurrent pneumonia. EXAM: CHEST  2 VIEW COMPARISON:  Prior chest x-ray 05/26/2014 FINDINGS: Stable cardiomegaly. Atherosclerotic calcifications again noted in the transverse aorta. No evidence of focal airspace consolidation to suggest pneumonia. No pleural effusion, pneumothorax or pulmonary edema. No acute osseous abnormality. IMPRESSION: 1. Stable borderline cardiomegaly. 2. No evidence of airspace consolidation to suggest active pneumonia. Electronically Signed   By: Malachy Moan M.D.   On: 03/13/2015 16:58   Dg Lumbar Spine 2-3 Views  Result Date: 03/13/2015 CLINICAL DATA:  Chronic low back pain, no known injury EXAM: LUMBAR SPINE - 2-3 VIEW COMPARISON:  04/04/2013 FINDINGS: Five lumbar type vertebral bodies. Mild straightening of the lumbar spine. No evidence of fracture or dislocation. Vertebral body heights are maintained. Mild to moderate degenerative changes, most prominent at L5-S1. Visualized bony pelvis appears intact. IMPRESSION: Mild to moderate degenerative changes, most prominent at L5-S1. Electronically Signed   By: Charline Bills M.D.   On: 03/13/2015 16:59    Assessment & Plan:   There are no diagnoses linked to this encounter. I am having Ms. Briski maintain her acetaminophen, nitroGLYCERIN, Cholecalciferol, clopidogrel, lisinopril, mirtazapine, carvedilol, amLODipine, NAMZARIC, and atorvastatin.  No orders of the defined types were placed in this encounter.    Follow-up: No Follow-up on file.  Sonda Primes, MD

## 2017-01-20 NOTE — Assessment & Plan Note (Signed)
Namzaric to cont

## 2017-01-20 NOTE — Assessment & Plan Note (Signed)
Cont w/Coreg, Amlodipine, Lisinopril

## 2017-01-21 DIAGNOSIS — I10 Essential (primary) hypertension: Secondary | ICD-10-CM | POA: Diagnosis not present

## 2017-01-21 DIAGNOSIS — I2510 Atherosclerotic heart disease of native coronary artery without angina pectoris: Secondary | ICD-10-CM | POA: Diagnosis not present

## 2017-01-21 DIAGNOSIS — G301 Alzheimer's disease with late onset: Secondary | ICD-10-CM | POA: Diagnosis not present

## 2017-01-21 DIAGNOSIS — Z9181 History of falling: Secondary | ICD-10-CM | POA: Diagnosis not present

## 2017-01-21 DIAGNOSIS — Z7902 Long term (current) use of antithrombotics/antiplatelets: Secondary | ICD-10-CM | POA: Diagnosis not present

## 2017-01-21 DIAGNOSIS — M545 Low back pain: Secondary | ICD-10-CM | POA: Diagnosis not present

## 2017-01-21 DIAGNOSIS — G8929 Other chronic pain: Secondary | ICD-10-CM | POA: Diagnosis not present

## 2017-01-22 DIAGNOSIS — I10 Essential (primary) hypertension: Secondary | ICD-10-CM | POA: Diagnosis not present

## 2017-01-22 DIAGNOSIS — I2510 Atherosclerotic heart disease of native coronary artery without angina pectoris: Secondary | ICD-10-CM | POA: Diagnosis not present

## 2017-01-22 DIAGNOSIS — Z7902 Long term (current) use of antithrombotics/antiplatelets: Secondary | ICD-10-CM | POA: Diagnosis not present

## 2017-01-22 DIAGNOSIS — G301 Alzheimer's disease with late onset: Secondary | ICD-10-CM | POA: Diagnosis not present

## 2017-01-22 DIAGNOSIS — G8929 Other chronic pain: Secondary | ICD-10-CM | POA: Diagnosis not present

## 2017-01-22 DIAGNOSIS — M545 Low back pain: Secondary | ICD-10-CM | POA: Diagnosis not present

## 2017-01-22 DIAGNOSIS — Z9181 History of falling: Secondary | ICD-10-CM | POA: Diagnosis not present

## 2017-01-25 DIAGNOSIS — Z7902 Long term (current) use of antithrombotics/antiplatelets: Secondary | ICD-10-CM | POA: Diagnosis not present

## 2017-01-25 DIAGNOSIS — G8929 Other chronic pain: Secondary | ICD-10-CM | POA: Diagnosis not present

## 2017-01-25 DIAGNOSIS — I10 Essential (primary) hypertension: Secondary | ICD-10-CM | POA: Diagnosis not present

## 2017-01-25 DIAGNOSIS — G301 Alzheimer's disease with late onset: Secondary | ICD-10-CM | POA: Diagnosis not present

## 2017-01-25 DIAGNOSIS — M545 Low back pain: Secondary | ICD-10-CM | POA: Diagnosis not present

## 2017-01-25 DIAGNOSIS — I2510 Atherosclerotic heart disease of native coronary artery without angina pectoris: Secondary | ICD-10-CM | POA: Diagnosis not present

## 2017-01-25 DIAGNOSIS — Z9181 History of falling: Secondary | ICD-10-CM | POA: Diagnosis not present

## 2017-01-26 DIAGNOSIS — I10 Essential (primary) hypertension: Secondary | ICD-10-CM | POA: Diagnosis not present

## 2017-01-26 DIAGNOSIS — M545 Low back pain: Secondary | ICD-10-CM | POA: Diagnosis not present

## 2017-01-26 DIAGNOSIS — G8929 Other chronic pain: Secondary | ICD-10-CM | POA: Diagnosis not present

## 2017-01-26 DIAGNOSIS — I2510 Atherosclerotic heart disease of native coronary artery without angina pectoris: Secondary | ICD-10-CM | POA: Diagnosis not present

## 2017-01-26 DIAGNOSIS — Z9181 History of falling: Secondary | ICD-10-CM | POA: Diagnosis not present

## 2017-01-26 DIAGNOSIS — Z7902 Long term (current) use of antithrombotics/antiplatelets: Secondary | ICD-10-CM | POA: Diagnosis not present

## 2017-01-26 DIAGNOSIS — G301 Alzheimer's disease with late onset: Secondary | ICD-10-CM | POA: Diagnosis not present

## 2017-01-27 DIAGNOSIS — G8929 Other chronic pain: Secondary | ICD-10-CM | POA: Diagnosis not present

## 2017-01-27 DIAGNOSIS — I2510 Atherosclerotic heart disease of native coronary artery without angina pectoris: Secondary | ICD-10-CM | POA: Diagnosis not present

## 2017-01-27 DIAGNOSIS — I10 Essential (primary) hypertension: Secondary | ICD-10-CM | POA: Diagnosis not present

## 2017-01-27 DIAGNOSIS — G301 Alzheimer's disease with late onset: Secondary | ICD-10-CM | POA: Diagnosis not present

## 2017-01-27 DIAGNOSIS — M545 Low back pain: Secondary | ICD-10-CM | POA: Diagnosis not present

## 2017-01-27 DIAGNOSIS — Z7902 Long term (current) use of antithrombotics/antiplatelets: Secondary | ICD-10-CM | POA: Diagnosis not present

## 2017-01-27 DIAGNOSIS — Z9181 History of falling: Secondary | ICD-10-CM | POA: Diagnosis not present

## 2017-01-28 DIAGNOSIS — Z9181 History of falling: Secondary | ICD-10-CM | POA: Diagnosis not present

## 2017-01-28 DIAGNOSIS — I2510 Atherosclerotic heart disease of native coronary artery without angina pectoris: Secondary | ICD-10-CM | POA: Diagnosis not present

## 2017-01-28 DIAGNOSIS — G8929 Other chronic pain: Secondary | ICD-10-CM | POA: Diagnosis not present

## 2017-01-28 DIAGNOSIS — I10 Essential (primary) hypertension: Secondary | ICD-10-CM | POA: Diagnosis not present

## 2017-01-28 DIAGNOSIS — M545 Low back pain: Secondary | ICD-10-CM | POA: Diagnosis not present

## 2017-01-28 DIAGNOSIS — G301 Alzheimer's disease with late onset: Secondary | ICD-10-CM | POA: Diagnosis not present

## 2017-01-28 DIAGNOSIS — Z7902 Long term (current) use of antithrombotics/antiplatelets: Secondary | ICD-10-CM | POA: Diagnosis not present

## 2017-01-29 DIAGNOSIS — G301 Alzheimer's disease with late onset: Secondary | ICD-10-CM | POA: Diagnosis not present

## 2017-01-29 DIAGNOSIS — G8929 Other chronic pain: Secondary | ICD-10-CM | POA: Diagnosis not present

## 2017-01-29 DIAGNOSIS — M545 Low back pain: Secondary | ICD-10-CM | POA: Diagnosis not present

## 2017-01-29 DIAGNOSIS — I2510 Atherosclerotic heart disease of native coronary artery without angina pectoris: Secondary | ICD-10-CM | POA: Diagnosis not present

## 2017-01-29 DIAGNOSIS — I10 Essential (primary) hypertension: Secondary | ICD-10-CM | POA: Diagnosis not present

## 2017-01-29 DIAGNOSIS — Z7902 Long term (current) use of antithrombotics/antiplatelets: Secondary | ICD-10-CM | POA: Diagnosis not present

## 2017-01-29 DIAGNOSIS — Z9181 History of falling: Secondary | ICD-10-CM | POA: Diagnosis not present

## 2017-01-30 DIAGNOSIS — I2510 Atherosclerotic heart disease of native coronary artery without angina pectoris: Secondary | ICD-10-CM | POA: Diagnosis not present

## 2017-01-30 DIAGNOSIS — M545 Low back pain: Secondary | ICD-10-CM | POA: Diagnosis not present

## 2017-01-30 DIAGNOSIS — Z7902 Long term (current) use of antithrombotics/antiplatelets: Secondary | ICD-10-CM | POA: Diagnosis not present

## 2017-01-30 DIAGNOSIS — Z9181 History of falling: Secondary | ICD-10-CM | POA: Diagnosis not present

## 2017-01-30 DIAGNOSIS — G8929 Other chronic pain: Secondary | ICD-10-CM | POA: Diagnosis not present

## 2017-01-30 DIAGNOSIS — G301 Alzheimer's disease with late onset: Secondary | ICD-10-CM | POA: Diagnosis not present

## 2017-01-30 DIAGNOSIS — I10 Essential (primary) hypertension: Secondary | ICD-10-CM | POA: Diagnosis not present

## 2017-02-01 DIAGNOSIS — I10 Essential (primary) hypertension: Secondary | ICD-10-CM | POA: Diagnosis not present

## 2017-02-01 DIAGNOSIS — I2510 Atherosclerotic heart disease of native coronary artery without angina pectoris: Secondary | ICD-10-CM | POA: Diagnosis not present

## 2017-02-01 DIAGNOSIS — G8929 Other chronic pain: Secondary | ICD-10-CM | POA: Diagnosis not present

## 2017-02-01 DIAGNOSIS — M545 Low back pain: Secondary | ICD-10-CM | POA: Diagnosis not present

## 2017-02-01 DIAGNOSIS — Z7902 Long term (current) use of antithrombotics/antiplatelets: Secondary | ICD-10-CM | POA: Diagnosis not present

## 2017-02-01 DIAGNOSIS — Z9181 History of falling: Secondary | ICD-10-CM | POA: Diagnosis not present

## 2017-02-01 DIAGNOSIS — G301 Alzheimer's disease with late onset: Secondary | ICD-10-CM | POA: Diagnosis not present

## 2017-02-02 DIAGNOSIS — Z9181 History of falling: Secondary | ICD-10-CM | POA: Diagnosis not present

## 2017-02-02 DIAGNOSIS — I2510 Atherosclerotic heart disease of native coronary artery without angina pectoris: Secondary | ICD-10-CM | POA: Diagnosis not present

## 2017-02-02 DIAGNOSIS — M545 Low back pain: Secondary | ICD-10-CM | POA: Diagnosis not present

## 2017-02-02 DIAGNOSIS — I10 Essential (primary) hypertension: Secondary | ICD-10-CM | POA: Diagnosis not present

## 2017-02-02 DIAGNOSIS — Z7902 Long term (current) use of antithrombotics/antiplatelets: Secondary | ICD-10-CM | POA: Diagnosis not present

## 2017-02-02 DIAGNOSIS — G8929 Other chronic pain: Secondary | ICD-10-CM | POA: Diagnosis not present

## 2017-02-02 DIAGNOSIS — G301 Alzheimer's disease with late onset: Secondary | ICD-10-CM | POA: Diagnosis not present

## 2017-02-03 DIAGNOSIS — G8929 Other chronic pain: Secondary | ICD-10-CM | POA: Diagnosis not present

## 2017-02-03 DIAGNOSIS — G301 Alzheimer's disease with late onset: Secondary | ICD-10-CM | POA: Diagnosis not present

## 2017-02-03 DIAGNOSIS — I10 Essential (primary) hypertension: Secondary | ICD-10-CM | POA: Diagnosis not present

## 2017-02-03 DIAGNOSIS — I2510 Atherosclerotic heart disease of native coronary artery without angina pectoris: Secondary | ICD-10-CM | POA: Diagnosis not present

## 2017-02-03 DIAGNOSIS — Z9181 History of falling: Secondary | ICD-10-CM | POA: Diagnosis not present

## 2017-02-03 DIAGNOSIS — M545 Low back pain: Secondary | ICD-10-CM | POA: Diagnosis not present

## 2017-02-03 DIAGNOSIS — Z7902 Long term (current) use of antithrombotics/antiplatelets: Secondary | ICD-10-CM | POA: Diagnosis not present

## 2017-02-05 DIAGNOSIS — M545 Low back pain: Secondary | ICD-10-CM | POA: Diagnosis not present

## 2017-02-05 DIAGNOSIS — Z9181 History of falling: Secondary | ICD-10-CM | POA: Diagnosis not present

## 2017-02-05 DIAGNOSIS — G301 Alzheimer's disease with late onset: Secondary | ICD-10-CM | POA: Diagnosis not present

## 2017-02-05 DIAGNOSIS — I2510 Atherosclerotic heart disease of native coronary artery without angina pectoris: Secondary | ICD-10-CM | POA: Diagnosis not present

## 2017-02-05 DIAGNOSIS — G8929 Other chronic pain: Secondary | ICD-10-CM | POA: Diagnosis not present

## 2017-02-05 DIAGNOSIS — Z7902 Long term (current) use of antithrombotics/antiplatelets: Secondary | ICD-10-CM | POA: Diagnosis not present

## 2017-02-05 DIAGNOSIS — I10 Essential (primary) hypertension: Secondary | ICD-10-CM | POA: Diagnosis not present

## 2017-02-10 DIAGNOSIS — Z7902 Long term (current) use of antithrombotics/antiplatelets: Secondary | ICD-10-CM | POA: Diagnosis not present

## 2017-02-10 DIAGNOSIS — I2510 Atherosclerotic heart disease of native coronary artery without angina pectoris: Secondary | ICD-10-CM | POA: Diagnosis not present

## 2017-02-10 DIAGNOSIS — I10 Essential (primary) hypertension: Secondary | ICD-10-CM | POA: Diagnosis not present

## 2017-02-10 DIAGNOSIS — M545 Low back pain: Secondary | ICD-10-CM | POA: Diagnosis not present

## 2017-02-10 DIAGNOSIS — G8929 Other chronic pain: Secondary | ICD-10-CM | POA: Diagnosis not present

## 2017-02-10 DIAGNOSIS — G301 Alzheimer's disease with late onset: Secondary | ICD-10-CM | POA: Diagnosis not present

## 2017-02-10 DIAGNOSIS — Z9181 History of falling: Secondary | ICD-10-CM | POA: Diagnosis not present

## 2017-02-17 ENCOUNTER — Other Ambulatory Visit: Payer: Self-pay | Admitting: Internal Medicine

## 2017-02-17 ENCOUNTER — Other Ambulatory Visit: Payer: Self-pay | Admitting: Cardiovascular Disease

## 2017-02-18 DIAGNOSIS — I10 Essential (primary) hypertension: Secondary | ICD-10-CM | POA: Diagnosis not present

## 2017-02-18 DIAGNOSIS — M545 Low back pain: Secondary | ICD-10-CM | POA: Diagnosis not present

## 2017-02-18 DIAGNOSIS — F0281 Dementia in other diseases classified elsewhere with behavioral disturbance: Secondary | ICD-10-CM | POA: Diagnosis not present

## 2017-02-18 DIAGNOSIS — Z9181 History of falling: Secondary | ICD-10-CM | POA: Diagnosis not present

## 2017-02-18 DIAGNOSIS — G301 Alzheimer's disease with late onset: Secondary | ICD-10-CM | POA: Diagnosis not present

## 2017-02-18 DIAGNOSIS — Z7902 Long term (current) use of antithrombotics/antiplatelets: Secondary | ICD-10-CM | POA: Diagnosis not present

## 2017-02-18 DIAGNOSIS — G8929 Other chronic pain: Secondary | ICD-10-CM

## 2017-02-18 DIAGNOSIS — I2510 Atherosclerotic heart disease of native coronary artery without angina pectoris: Secondary | ICD-10-CM | POA: Diagnosis not present

## 2017-03-25 DIAGNOSIS — H26493 Other secondary cataract, bilateral: Secondary | ICD-10-CM | POA: Diagnosis not present

## 2017-03-25 DIAGNOSIS — H04123 Dry eye syndrome of bilateral lacrimal glands: Secondary | ICD-10-CM | POA: Diagnosis not present

## 2017-03-25 DIAGNOSIS — H353131 Nonexudative age-related macular degeneration, bilateral, early dry stage: Secondary | ICD-10-CM | POA: Diagnosis not present

## 2017-03-25 DIAGNOSIS — H5211 Myopia, right eye: Secondary | ICD-10-CM | POA: Diagnosis not present

## 2017-03-27 ENCOUNTER — Other Ambulatory Visit: Payer: Self-pay | Admitting: Internal Medicine

## 2017-04-12 ENCOUNTER — Other Ambulatory Visit: Payer: Self-pay | Admitting: Internal Medicine

## 2017-04-14 ENCOUNTER — Encounter: Payer: Self-pay | Admitting: Gynecology

## 2017-04-28 ENCOUNTER — Encounter: Payer: Self-pay | Admitting: Internal Medicine

## 2017-04-28 ENCOUNTER — Ambulatory Visit (INDEPENDENT_AMBULATORY_CARE_PROVIDER_SITE_OTHER): Payer: Medicare Other | Admitting: Internal Medicine

## 2017-04-28 DIAGNOSIS — F0281 Dementia in other diseases classified elsewhere with behavioral disturbance: Secondary | ICD-10-CM

## 2017-04-28 DIAGNOSIS — R413 Other amnesia: Secondary | ICD-10-CM | POA: Diagnosis not present

## 2017-04-28 DIAGNOSIS — E785 Hyperlipidemia, unspecified: Secondary | ICD-10-CM

## 2017-04-28 DIAGNOSIS — I2510 Atherosclerotic heart disease of native coronary artery without angina pectoris: Secondary | ICD-10-CM | POA: Diagnosis not present

## 2017-04-28 DIAGNOSIS — F4321 Adjustment disorder with depressed mood: Secondary | ICD-10-CM

## 2017-04-28 DIAGNOSIS — G301 Alzheimer's disease with late onset: Secondary | ICD-10-CM

## 2017-04-28 NOTE — Assessment & Plan Note (Signed)
Discussed.

## 2017-04-28 NOTE — Assessment & Plan Note (Signed)
On Lipitor 

## 2017-04-28 NOTE — Assessment & Plan Note (Signed)
Remeron  

## 2017-04-28 NOTE — Progress Notes (Signed)
Subjective:  Patient ID: [name], female    DOB: [birth year]  Age: 83 y.o. MRN: [id number]  CC: No chief complaint on file.   HPI   [name] presents for dementia, apathy, HTN, CAD f/u    Outpatient Medications Prior to Visit  Medication Sig Dispense Refill  . acetaminophen (TYLENOL) 325 MG tablet Take 2 tablets (650 mg total) by mouth every 4 (four) hours as needed for headache or mild pain.    [name] amLODipine (NORVASC) 5 MG tablet TAKE 1/2 TABLET BY MOUTH ONCE DAILY 45 tablet 2  . atorvastatin (LIPITOR) 20 MG tablet TAKE 1 TABLET BY MOUTH DAILY 90 tablet 2  . carvedilol (COREG) 12.5 MG tablet TAKE 1 TABLET BY MOUTH TWICE DAILY WITH A MEAL 60 tablet 5  . Cholecalciferol (CVS VITAMIN D3) 1000 UNITS capsule Take 1 capsule (1,000 Units total) by mouth daily. 100 capsule 3  . clopidogrel (PLAVIX) 75 MG tablet Take 75 mg by mouth daily.    . clopidogrel (PLAVIX) 75 MG tablet TAKE 1 TABLET BY MOUTH EVERY DAY 30 tablet 10  . lisinopril (PRINIVIL,ZESTRIL) 5 MG tablet TAKE 1 TABLET BY MOUTH DAILY 90 tablet 2  . mirtazapine (REMERON) 15 MG tablet TAKE 1 TABLET BY MOUTH EVERY NIGHT AT BEDTIME 90 tablet 1  . NAMZARIC 28-10 MG CP24 TAKE 1 CAPSULE BY MOUTH DAILY 30 capsule 5  . nitroGLYCERIN (NITROSTAT) 0.4 MG SL tablet Place 1 tablet (0.4 mg total) under the tongue every 5 (five) minutes as needed for chest pain. 25 tablet 2   No facility-administered medications prior to visit.     ROS Review of Systems  Constitutional: Negative for activity change, appetite change, chills, fatigue and unexpected weight change.  HENT: Negative for congestion, mouth sores and sinus pressure.   Eyes: Negative for visual disturbance.  Respiratory: Negative for cough and chest tightness.   Gastrointestinal: Negative for abdominal pain and nausea.  Genitourinary: Negative for difficulty urinating, frequency and vaginal pain.  Musculoskeletal: Positive for back pain. Negative for gait problem.  Skin:  Negative for pallor and rash.  Neurological: Negative for dizziness, tremors, weakness, numbness and headaches.  Psychiatric/Behavioral: Positive for confusion and decreased concentration. Negative for sleep disturbance and suicidal ideas. The patient is nervous/anxious.     Objective:  BP 138/64 (BP Location: Left Arm, Patient Position: Sitting, Cuff Size: Normal)   Pulse 77   Temp 97.6 F (36.4 C) (Oral)   Ht 5\' 3"  (1.6 m)   Wt 122 lb (55.3 kg)   SpO2 99%   BMI 21.61 kg/m   BP Readings from Last 3 Encounters:  04/28/17 138/64  01/20/17 110/60  12/25/16 124/60    Wt Readings from Last 3 Encounters:  04/28/17 122 lb (55.3 kg)  01/20/17 128 lb 8 oz (58.3 kg)  12/25/16 124 lb 12.8 oz (56.6 kg)    Physical Exam  Constitutional: She appears well-developed. No distress.  HENT:  Head: Normocephalic.  Right Ear: External ear normal.  Left Ear: External ear normal.  Nose: Nose normal.  Mouth/Throat: Oropharynx is clear and moist.  Eyes: Conjunctivae are normal. Pupils are equal, round, and reactive to light. Right eye exhibits no discharge. Left eye exhibits no discharge.  Neck: Normal range of motion. Neck supple. No JVD present. No tracheal deviation present. No thyromegaly present.  Cardiovascular: Normal rate, regular rhythm and normal heart sounds.   Pulmonary/Chest: No stridor. No respiratory distress. She has no wheezes.  Abdominal: Soft. Bowel sounds are normal.  She exhibits no distension and no mass. There is no tenderness. There is no rebound and no guarding.  Musculoskeletal: She exhibits tenderness. She exhibits no edema.  Lymphadenopathy:    She has no cervical adenopathy.  Neurological: She displays normal reflexes. No cranial nerve deficit. She exhibits normal muscle tone. Coordination normal.  Skin: No rash noted. No erythema.  Psychiatric: Her behavior is normal. Judgment and thought content normal.    Lab Results  Component Value Date   WBC 5.6 07/08/2016     HGB 13.5 07/08/2016   HCT 38.6 07/08/2016   PLT 192.0 07/08/2016   GLUCOSE 96 07/08/2016   CHOL 140 09/06/2015   TRIG 193 (H) 09/06/2015   HDL 41 (L) 09/06/2015   LDLCALC 60 09/06/2015   ALT 10 07/08/2016   AST 16 07/08/2016   NA 140 07/08/2016   K 4.3 07/08/2016   CL 106 07/08/2016   CREATININE 1.11 07/08/2016   BUN 20 07/08/2016   CO2 29 07/08/2016   TSH 3.42 07/08/2016   INR 0.97 03/07/2015    Dg Chest 2 View  Result Date: 03/13/2015 CLINICAL DATA:  83 year old female with chills and past history of pneumonia. Evaluate for recurrent pneumonia. EXAM: CHEST  2 VIEW COMPARISON:  Prior chest x-ray 05/26/2014 FINDINGS: Stable cardiomegaly. Atherosclerotic calcifications again noted in the transverse aorta. No evidence of focal airspace consolidation to suggest pneumonia. No pleural effusion, pneumothorax or pulmonary edema. No acute osseous abnormality. IMPRESSION: 1. Stable borderline cardiomegaly. 2. No evidence of airspace consolidation to suggest active pneumonia. Electronically Signed   By: Malachy MoanHeath  McCullough M.D.   On: 03/13/2015 16:58   Dg Lumbar Spine 2-3 Views  Result Date: 03/13/2015 CLINICAL DATA:  Chronic low back pain, no known injury EXAM: LUMBAR SPINE - 2-3 VIEW COMPARISON:  04/04/2013 FINDINGS: Five lumbar type vertebral bodies. Mild straightening of the lumbar spine. No evidence of fracture or dislocation. Vertebral body heights are maintained. Mild to moderate degenerative changes, most prominent at L5-S1. Visualized bony pelvis appears intact. IMPRESSION: Mild to moderate degenerative changes, most prominent at L5-S1. Electronically Signed   By: Charline BillsSriyesh  Krishnan M.D.   On: 03/13/2015 16:59    Assessment & Plan:   There are no diagnoses linked to this encounter. I am having [name] maintain her acetaminophen, nitroGLYCERIN, Cholecalciferol, clopidogrel, carvedilol, amLODipine, atorvastatin, clopidogrel, lisinopril, mirtazapine, and NAMZARIC.  No orders of the  defined types were placed in this encounter.    Follow-up: No Follow-up on file.  Sonda PrimesAlex Aliea Bobe, MD

## 2017-04-28 NOTE — Assessment & Plan Note (Signed)
Lipitor, Amlodipine, Plavix, Coreg NTG prn

## 2017-04-28 NOTE — Assessment & Plan Note (Signed)
Chronic - on Aricept and Namenda (Namzaric) Not driving She has a CO and smoke detectors

## 2017-05-10 ENCOUNTER — Ambulatory Visit: Payer: Medicare Other | Admitting: Internal Medicine

## 2017-05-26 ENCOUNTER — Encounter: Payer: Self-pay | Admitting: Internal Medicine

## 2017-05-28 ENCOUNTER — Other Ambulatory Visit (INDEPENDENT_AMBULATORY_CARE_PROVIDER_SITE_OTHER): Payer: Medicare Other

## 2017-05-28 DIAGNOSIS — E785 Hyperlipidemia, unspecified: Secondary | ICD-10-CM | POA: Diagnosis not present

## 2017-05-28 DIAGNOSIS — I2510 Atherosclerotic heart disease of native coronary artery without angina pectoris: Secondary | ICD-10-CM

## 2017-05-28 LAB — BASIC METABOLIC PANEL
BUN: 20 mg/dL (ref 6–23)
CALCIUM: 9.8 mg/dL (ref 8.4–10.5)
CO2: 28 mEq/L (ref 19–32)
CREATININE: 1 mg/dL (ref 0.40–1.20)
Chloride: 105 mEq/L (ref 96–112)
GFR: 55.64 mL/min — ABNORMAL LOW (ref >60.00)
GLUCOSE: 105 mg/dL — AB (ref 70–99)
Potassium: 4.4 mEq/L (ref 3.5–5.1)
Sodium: 142 mEq/L (ref 135–145)

## 2017-05-28 LAB — LIPID PANEL
CHOLESTEROL: 145 mg/dL (ref 0–200)
HDL: 41 mg/dL (ref >39.00)
NonHDL: 104.29
Total CHOL/HDL Ratio: 4
Triglycerides: 228 mg/dL — ABNORMAL HIGH (ref 0.0–149.0)
VLDL: 45.6 mg/dL — AB (ref 0.0–40.0)

## 2017-05-28 LAB — HEPATIC FUNCTION PANEL
ALBUMIN: 4.2 g/dL (ref 3.5–5.2)
ALT: 12 U/L (ref 0–35)
AST: 13 U/L (ref 0–37)
Alkaline Phosphatase: 60 U/L (ref 39–117)
BILIRUBIN TOTAL: 0.6 mg/dL (ref 0.2–1.2)
Bilirubin, Direct: 0.1 mg/dL (ref 0.0–0.3)
Total Protein: 7.3 g/dL (ref 6.0–8.3)

## 2017-05-28 LAB — TSH: TSH: 2.64 u[IU]/mL (ref 0.35–4.50)

## 2017-05-28 LAB — LDL CHOLESTEROL, DIRECT: LDL DIRECT: 61 mg/dL

## 2017-06-01 ENCOUNTER — Encounter: Payer: Self-pay | Admitting: Internal Medicine

## 2017-06-01 ENCOUNTER — Ambulatory Visit (INDEPENDENT_AMBULATORY_CARE_PROVIDER_SITE_OTHER): Payer: Medicare Other | Admitting: Internal Medicine

## 2017-06-01 ENCOUNTER — Other Ambulatory Visit (INDEPENDENT_AMBULATORY_CARE_PROVIDER_SITE_OTHER): Payer: Medicare Other

## 2017-06-01 ENCOUNTER — Ambulatory Visit (INDEPENDENT_AMBULATORY_CARE_PROVIDER_SITE_OTHER)
Admission: RE | Admit: 2017-06-01 | Discharge: 2017-06-01 | Disposition: A | Discharge status: Home / Self Care | Payer: Medicare Other | Source: Ambulatory Visit | Attending: Internal Medicine | Admitting: Internal Medicine

## 2017-06-01 DIAGNOSIS — G8929 Other chronic pain: Secondary | ICD-10-CM

## 2017-06-01 DIAGNOSIS — F02818 Dementia in other diseases classified elsewhere, unspecified severity, with other behavioral disturbance: Secondary | ICD-10-CM

## 2017-06-01 DIAGNOSIS — M545 Low back pain: Secondary | ICD-10-CM

## 2017-06-01 DIAGNOSIS — G301 Alzheimer's disease with late onset: Secondary | ICD-10-CM

## 2017-06-01 DIAGNOSIS — F0281 Dementia in other diseases classified elsewhere with behavioral disturbance: Secondary | ICD-10-CM | POA: Diagnosis not present

## 2017-06-01 DIAGNOSIS — E785 Hyperlipidemia, unspecified: Secondary | ICD-10-CM

## 2017-06-01 LAB — URINALYSIS
Bilirubin Urine: NEGATIVE
LEUKOCYTES UA: NEGATIVE
Nitrite: POSITIVE — AB
Total Protein, Urine: NEGATIVE
UROBILINOGEN UA: 0.2 (ref 0.0–1.0)
Urine Glucose: NEGATIVE
pH: 5.5 (ref 5.0–8.0)

## 2017-06-01 LAB — URINALYSIS, ROUTINE W REFLEX MICROSCOPIC
BILIRUBIN URINE: NEGATIVE
Leukocytes, UA: NEGATIVE
NITRITE: POSITIVE — AB
Specific Gravity, Urine: >=1.03 — AB (ref 1.000–1.030)
TOTAL PROTEIN, URINE-UPE24: NEGATIVE
URINE GLUCOSE: NEGATIVE
UROBILINOGEN UA: 0.2 (ref 0.0–1.0)
pH: 5.5 (ref 5.0–8.0)

## 2017-06-01 MED ORDER — TRAMADOL-ACETAMINOPHEN 37.5-325 MG PO TABS: 0.5000 | ORAL_TABLET | Freq: Two times a day (BID) | ORAL | 1 refills | Status: DC | PRN

## 2017-06-01 NOTE — Assessment & Plan Note (Signed)
X ray UA Ultracet 1/2-1 prn   Potential benefits of opioids use as well as potential risks (i.e. addiction risk, apnea etc) and complications (i.e. Somnolence, constipation and others) were explained to the patient and were aknowledged.

## 2017-06-01 NOTE — Assessment & Plan Note (Signed)
Hold Lipitor x 2 weeks due to pain

## 2017-06-01 NOTE — Patient Instructions (Signed)
Hold Lipitor x 2 weeks 

## 2017-06-01 NOTE — Assessment & Plan Note (Signed)
Namzaric 

## 2017-06-01 NOTE — Progress Notes (Signed)
Subjective:  Patient ID: [name], female    DOB: May 25, 1927  Age: 83 y.o. MRN: [id number]  CC: No chief complaint on file.   HPI Mora Bellmanuby Clancy GourdM Chai presents for LBP - worse over past month or 2 wks w/poss irrad to legs. Poor historian  Outpatient Medications Prior to Visit  Medication Sig Dispense Refill  . acetaminophen (TYLENOL) 325 MG tablet Take 2 tablets (650 mg total) by mouth every 4 (four) hours as needed for headache or mild pain.    [name]. amLODipine (NORVASC) 5 MG tablet TAKE 1/2 TABLET BY MOUTH ONCE DAILY 45 tablet 2  . atorvastatin (LIPITOR) 20 MG tablet TAKE 1 TABLET BY MOUTH DAILY 90 tablet 2  . carvedilol (COREG) 12.5 MG tablet TAKE 1 TABLET BY MOUTH TWICE DAILY WITH A MEAL 60 tablet 5  . Cholecalciferol ([employer] VITAMIN D3) 1000 UNITS capsule Take 1 capsule (1,000 Units total) by mouth daily. 100 capsule 3  . clopidogrel (PLAVIX) 75 MG tablet Take 75 mg by mouth daily.    . clopidogrel (PLAVIX) 75 MG tablet TAKE 1 TABLET BY MOUTH EVERY DAY 30 tablet 10  . lisinopril (PRINIVIL,ZESTRIL) 5 MG tablet TAKE 1 TABLET BY MOUTH DAILY 90 tablet 2  . mirtazapine (REMERON) 15 MG tablet TAKE 1 TABLET BY MOUTH EVERY NIGHT AT BEDTIME 90 tablet 1  . NAMZARIC 28-10 MG CP24 TAKE 1 CAPSULE BY MOUTH DAILY 30 capsule 5  . nitroGLYCERIN (NITROSTAT) 0.4 MG SL tablet Place 1 tablet (0.4 mg total) under the tongue every 5 (five) minutes as needed for chest pain. 25 tablet 2   No facility-administered medications prior to visit.     ROS Review of Systems  Constitutional: Negative for activity change, appetite change, chills, fatigue and unexpected weight change.  HENT: Negative for congestion, mouth sores and sinus pressure.   Eyes: Negative for visual disturbance.  Respiratory: Negative for cough and chest tightness.   Gastrointestinal: Negative for abdominal pain and nausea.  Genitourinary: Negative for difficulty urinating, frequency and vaginal pain.  Musculoskeletal: Positive for back pain  and gait problem.  Skin: Negative for pallor and rash.  Neurological: Negative for dizziness, tremors, weakness, numbness and headaches.  Psychiatric/Behavioral: Negative for confusion, sleep disturbance and suicidal ideas.    Objective:  BP 126/68 (BP Location: Left Arm, Patient Position: Sitting, Cuff Size: Normal)   Pulse 64   Temp 98.1 F (36.7 C) (Oral)   Ht 5\' 3"  (1.6 m)   Wt 120 lb (54.4 kg)   SpO2 99%   BMI 21.26 kg/m   BP Readings from Last 3 Encounters:  06/01/17 126/68  04/28/17 138/64  01/20/17 110/60    Wt Readings from Last 3 Encounters:  06/01/17 120 lb (54.4 kg)  04/28/17 122 lb (55.3 kg)  01/20/17 128 lb 8 oz (58.3 kg)    Physical Exam  Constitutional: She appears well-developed. No distress.  HENT:  Head: Normocephalic.  Right Ear: External ear normal.  Left Ear: External ear normal.  Nose: Nose normal.  Mouth/Throat: Oropharynx is clear and moist.  Eyes: Conjunctivae are normal. Pupils are equal, round, and reactive to light. Right eye exhibits no discharge. Left eye exhibits no discharge.  Neck: Normal range of motion. Neck supple. No JVD present. No tracheal deviation present. No thyromegaly present.  Cardiovascular: Normal rate, regular rhythm and normal heart sounds.   Pulmonary/Chest: No stridor. No respiratory distress. She has no wheezes.  Abdominal: Soft. Bowel sounds are normal. She exhibits no distension and no mass.  There is no tenderness. There is no rebound and no guarding.  Musculoskeletal: She exhibits tenderness. She exhibits no edema.  Lymphadenopathy:    She has no cervical adenopathy.  Neurological: She displays normal reflexes. No cranial nerve deficit. She exhibits normal muscle tone. Coordination normal.  Skin: No rash noted. No erythema.  Psychiatric: She has a normal mood and affect. Her behavior is normal. Judgment and thought content normal.  LS tender   Lab Results  Component Value Date   WBC 5.6 07/08/2016   HGB 13.5  07/08/2016   HCT 38.6 07/08/2016   PLT 192.0 07/08/2016   GLUCOSE 105 (H) 05/28/2017   CHOL 145 05/28/2017   TRIG 228.0 (H) 05/28/2017   HDL 41.00 05/28/2017   LDLDIRECT 61.0 05/28/2017   LDLCALC 60 09/06/2015   ALT 12 05/28/2017   AST 13 05/28/2017   NA 142 05/28/2017   K 4.4 05/28/2017   CL 105 05/28/2017   CREATININE 1.00 05/28/2017   BUN 20 05/28/2017   CO2 28 05/28/2017   TSH 2.64 05/28/2017   INR 0.97 03/07/2015    Dg Chest 2 View  Result Date: 03/13/2015 CLINICAL DATA:  83 year old female with chills and past history of pneumonia. Evaluate for recurrent pneumonia. EXAM: CHEST  2 VIEW COMPARISON:  Prior chest x-ray 05/26/2014 FINDINGS: Stable cardiomegaly. Atherosclerotic calcifications again noted in the transverse aorta. No evidence of focal airspace consolidation to suggest pneumonia. No pleural effusion, pneumothorax or pulmonary edema. No acute osseous abnormality. IMPRESSION: 1. Stable borderline cardiomegaly. 2. No evidence of airspace consolidation to suggest active pneumonia. Electronically Signed   By: Malachy Moan M.D.   On: 03/13/2015 16:58   Dg Lumbar Spine 2-3 Views  Result Date: 03/13/2015 CLINICAL DATA:  Chronic low back pain, no known injury EXAM: LUMBAR SPINE - 2-3 VIEW COMPARISON:  04/04/2013 FINDINGS: Five lumbar type vertebral bodies. Mild straightening of the lumbar spine. No evidence of fracture or dislocation. Vertebral body heights are maintained. Mild to moderate degenerative changes, most prominent at L5-S1. Visualized bony pelvis appears intact. IMPRESSION: Mild to moderate degenerative changes, most prominent at L5-S1. Electronically Signed   By: Charline Bills M.D.   On: 03/13/2015 16:59    Assessment & Plan:   There are no diagnoses linked to this encounter. I am having Ms. Chappelle maintain her acetaminophen, nitroGLYCERIN, Cholecalciferol, clopidogrel, carvedilol, amLODipine, atorvastatin, clopidogrel, lisinopril, mirtazapine, and  NAMZARIC.  No orders of the defined types were placed in this encounter.    Follow-up: No Follow-up on file.  Sonda Primes, MD

## 2017-07-13 ENCOUNTER — Ambulatory Visit (INDEPENDENT_AMBULATORY_CARE_PROVIDER_SITE_OTHER): Payer: Medicare Other | Admitting: Internal Medicine

## 2017-07-13 ENCOUNTER — Encounter: Payer: Self-pay | Admitting: Internal Medicine

## 2017-07-13 DIAGNOSIS — F02818 Dementia in other diseases classified elsewhere, unspecified severity, with other behavioral disturbance: Secondary | ICD-10-CM

## 2017-07-13 DIAGNOSIS — R413 Other amnesia: Secondary | ICD-10-CM

## 2017-07-13 DIAGNOSIS — F0281 Dementia in other diseases classified elsewhere with behavioral disturbance: Secondary | ICD-10-CM

## 2017-07-13 DIAGNOSIS — M5440 Lumbago with sciatica, unspecified side: Secondary | ICD-10-CM | POA: Diagnosis not present

## 2017-07-13 DIAGNOSIS — G301 Alzheimer's disease with late onset: Secondary | ICD-10-CM | POA: Diagnosis not present

## 2017-07-13 DIAGNOSIS — G8929 Other chronic pain: Secondary | ICD-10-CM

## 2017-07-13 MED ORDER — ATORVASTATIN CALCIUM 20 MG PO TABS: 10.0000 mg | ORAL_TABLET | Freq: Every day | ORAL | 2 refills | Status: DC

## 2017-07-13 NOTE — Assessment & Plan Note (Signed)
Worsening apraxia Cont w/Nammzaric RTC 3 mo

## 2017-07-13 NOTE — Progress Notes (Signed)
Subjective:  Patient ID: [name], female    DOB: [birth year]  Age: 83 y.o. MRN: [id number]  CC: No chief complaint on file.   HPI [name] presents for dementia - worse; having hard time w/usual tasks - washing dishes, brushing teeth  etc...  Outpatient Medications Prior to Visit  Medication Sig Dispense Refill  . acetaminophen (TYLENOL) 325 MG tablet Take 2 tablets (650 mg total) by mouth every 4 (four) hours as needed for headache or mild pain.    [name]. amLODipine (NORVASC) 5 MG tablet TAKE 1/2 TABLET BY MOUTH ONCE DAILY 45 tablet 2  . atorvastatin (LIPITOR) 20 MG tablet TAKE 1 TABLET BY MOUTH DAILY 90 tablet 2  . carvedilol (COREG) 12.5 MG tablet TAKE 1 TABLET BY MOUTH TWICE DAILY WITH A MEAL 60 tablet 5  . Cholecalciferol ([employer] VITAMIN D3) 1000 UNITS capsule Take 1 capsule (1,000 Units total) by mouth daily. 100 capsule 3  . clopidogrel (PLAVIX) 75 MG tablet TAKE 1 TABLET BY MOUTH EVERY DAY 30 tablet 10  . lisinopril (PRINIVIL,ZESTRIL) 5 MG tablet TAKE 1 TABLET BY MOUTH DAILY 90 tablet 2  . mirtazapine (REMERON) 15 MG tablet TAKE 1 TABLET BY MOUTH EVERY NIGHT AT BEDTIME 90 tablet 1  . NAMZARIC 28-10 MG CP24 TAKE 1 CAPSULE BY MOUTH DAILY 30 capsule 5  . nitroGLYCERIN (NITROSTAT) 0.4 MG SL tablet Place 1 tablet (0.4 mg total) under the tongue every 5 (five) minutes as needed for chest pain. 25 tablet 2  . traMADol-acetaminophen (ULTRACET) 37.5-325 MG tablet Take 0.5-1 tablets by mouth 2 (two) times daily as needed. 60 tablet 1   No facility-administered medications prior to visit.     ROS Review of Systems  Constitutional: Negative for activity change, appetite change, chills, fatigue and unexpected weight change.  HENT: Negative for congestion, mouth sores and sinus pressure.   Eyes: Negative for visual disturbance.  Respiratory: Negative for cough and chest tightness.   Gastrointestinal: Negative for abdominal pain and nausea.  Genitourinary: Negative for difficulty  urinating, frequency and vaginal pain.  Musculoskeletal: Negative for back pain and gait problem.  Skin: Negative for pallor and rash.  Neurological: Negative for dizziness, tremors, weakness, numbness and headaches.  Psychiatric/Behavioral: Positive for behavioral problems, confusion and decreased concentration. Negative for sleep disturbance.    Objective:  BP 132/64 (BP Location: Left Arm, Patient Position: Sitting, Cuff Size: Normal)   Pulse 62   Temp 97.7 F (36.5 C) (Oral)   Ht 5\' 3"  (1.6 m)   Wt 122 lb (55.3 kg)   SpO2 99%   BMI 21.61 kg/m   BP Readings from Last 3 Encounters:  07/13/17 132/64  06/01/17 126/68  04/28/17 138/64    Wt Readings from Last 3 Encounters:  07/13/17 122 lb (55.3 kg)  06/01/17 120 lb (54.4 kg)  04/28/17 122 lb (55.3 kg)    Physical Exam  Constitutional: She appears well-developed. No distress.  HENT:  Head: Normocephalic.  Right Ear: External ear normal.  Left Ear: External ear normal.  Nose: Nose normal.  Mouth/Throat: Oropharynx is clear and moist.  Eyes: Pupils are equal, round, and reactive to light. Conjunctivae are normal. Right eye exhibits no discharge. Left eye exhibits no discharge.  Neck: Normal range of motion. Neck supple. No JVD present. No tracheal deviation present. No thyromegaly present.  Cardiovascular: Normal rate, regular rhythm and normal heart sounds.   Pulmonary/Chest: No stridor. No respiratory distress. She has no wheezes.  Abdominal: Soft. Bowel sounds are  normal. She exhibits no distension and no mass. There is no tenderness. There is no rebound and no guarding.  Musculoskeletal: She exhibits no edema or tenderness.  Lymphadenopathy:    She has no cervical adenopathy.  Neurological: She displays normal reflexes. No cranial nerve deficit. She exhibits normal muscle tone. Coordination normal.  Skin: No rash noted. No erythema.  Psychiatric: She has a normal mood and affect. Her behavior is normal.  disoriented     Lab Results  Component Value Date   WBC 5.6 07/08/2016   HGB 13.5 07/08/2016   HCT 38.6 07/08/2016   PLT 192.0 07/08/2016   GLUCOSE 105 (H) 05/28/2017   CHOL 145 05/28/2017   TRIG 228.0 (H) 05/28/2017   HDL 41.00 05/28/2017   LDLDIRECT 61.0 05/28/2017   LDLCALC 60 09/06/2015   ALT 12 05/28/2017   AST 13 05/28/2017   NA 142 05/28/2017   K 4.4 05/28/2017   CL 105 05/28/2017   CREATININE 1.00 05/28/2017   BUN 20 05/28/2017   CO2 28 05/28/2017   TSH 2.64 05/28/2017   INR 0.97 03/07/2015    Dg Lumbar Spine 2-3 Views  Result Date: 06/01/2017 CLINICAL DATA:  Chronic low back pain, worse during the past week. No known injury. EXAM: LUMBAR SPINE - 2-3 VIEW COMPARISON:  03/13/2015 FINDINGS: There are 5 non rib-bearing lumbar type vertebral bodies. Mild scoliotic curvature of the thoracolumbar spine with dominant caudal component convex the right, unchanged. There is mild straightening expected lumbar lordosis, unchanged. Unchanged mild (approximately 4 mm) of anterolisthesis of L4 upon L5. Lumbar vertebral body heights appear preserved Moderate to severe multilevel lumbar spine DDD, likely worse at L4-L5 with disc space height loss, endplate irregularity and sclerosis, progressed compared to the 03/2015 examination. Limited visualization of the bilateral SI joints is normal Calcified atherosclerotic plaque within the abdominal aorta. Regional bowel gas pattern is normal. IMPRESSION: 1. No acute findings. 2. Moderate severe multilevel lumbar spine DDD, likely worse at L4-L5, progressed compared to the 03/2015 examination. 3.  Aortic Atherosclerosis (ICD10-I70.0). Electronically Signed   By: Simonne Come M.D.   On: 06/01/2017 12:42    Assessment & Plan:   There are no diagnoses linked to this encounter. I am having Ms. Droege maintain her acetaminophen, nitroGLYCERIN, Cholecalciferol, carvedilol, amLODipine, atorvastatin, clopidogrel, lisinopril, mirtazapine, NAMZARIC, and  traMADol-acetaminophen.  No orders of the defined types were placed in this encounter.    Follow-up: No Follow-up on file.  Sonda Primes, MD

## 2017-07-13 NOTE — Assessment & Plan Note (Signed)
Better  Ultracet prn

## 2017-07-13 NOTE — Assessment & Plan Note (Signed)
Worsening apraxia Cont w/Nammzaric RTC 3 mo Neurol ref offered

## 2017-08-27 ENCOUNTER — Other Ambulatory Visit: Payer: Self-pay | Admitting: Internal Medicine

## 2017-09-01 ENCOUNTER — Ambulatory Visit: Payer: Medicare Other | Admitting: Internal Medicine

## 2017-09-15 ENCOUNTER — Encounter: Payer: Self-pay | Admitting: Internal Medicine

## 2017-09-16 ENCOUNTER — Ambulatory Visit (INDEPENDENT_AMBULATORY_CARE_PROVIDER_SITE_OTHER): Payer: Medicare Other | Admitting: General Practice

## 2017-09-16 DIAGNOSIS — Z23 Encounter for immunization: Secondary | ICD-10-CM

## 2017-09-23 ENCOUNTER — Telehealth: Payer: Self-pay | Admitting: Internal Medicine

## 2017-09-23 NOTE — Telephone Encounter (Signed)
OK to fill this/these prescription(s) with additional refills x2 Thank you!  

## 2017-09-23 NOTE — Telephone Encounter (Signed)
Pt daughter called for a refill of her traMADol-acetaminophen (ULTRACET) 37.5-325 MG tablet  Please advise and call back  POF

## 2017-09-24 MED ORDER — TRAMADOL-ACETAMINOPHEN 37.5-325 MG PO TABS: 0.5000 | ORAL_TABLET | Freq: Two times a day (BID) | ORAL | 2 refills | Status: DC | PRN

## 2017-09-24 NOTE — Telephone Encounter (Signed)
Printed script, MD signed, notified daughter rx will be faxed over to [facility]...Raechel Chute/lmb

## 2017-10-13 ENCOUNTER — Ambulatory Visit (INDEPENDENT_AMBULATORY_CARE_PROVIDER_SITE_OTHER): Payer: Medicare Other | Admitting: Internal Medicine

## 2017-10-13 ENCOUNTER — Encounter: Payer: Self-pay | Admitting: Internal Medicine

## 2017-10-13 VITALS — BP 160/80 | HR 64 | Temp 97.5°F | Ht 63.0 in | Wt 121.2 lb

## 2017-10-13 DIAGNOSIS — F0281 Dementia in other diseases classified elsewhere with behavioral disturbance: Secondary | ICD-10-CM | POA: Diagnosis not present

## 2017-10-13 DIAGNOSIS — G301 Alzheimer's disease with late onset: Secondary | ICD-10-CM | POA: Diagnosis not present

## 2017-10-13 DIAGNOSIS — F4321 Adjustment disorder with depressed mood: Secondary | ICD-10-CM

## 2017-10-13 DIAGNOSIS — R413 Other amnesia: Secondary | ICD-10-CM

## 2017-10-13 DIAGNOSIS — I10 Essential (primary) hypertension: Secondary | ICD-10-CM

## 2017-10-13 DIAGNOSIS — Z23 Encounter for immunization: Secondary | ICD-10-CM

## 2017-10-13 DIAGNOSIS — F02818 Dementia in other diseases classified elsewhere, unspecified severity, with other behavioral disturbance: Secondary | ICD-10-CM

## 2017-10-13 NOTE — Assessment & Plan Note (Signed)
Namzaric 

## 2017-10-13 NOTE — Assessment & Plan Note (Signed)
Remeron  

## 2017-10-13 NOTE — Progress Notes (Signed)
Subjective:  Patient ID: [name], female    DOB: [birth year]  Age: 83 y.o. MRN: [id number]  CC: No chief complaint on file.   HPI [name] presents for Alzheimer's, CAD, dyslipidemia f/u  Outpatient Medications Prior to Visit  Medication Sig Dispense Refill  . acetaminophen (TYLENOL) 325 MG tablet Take 2 tablets (650 mg total) by mouth every 4 (four) hours as needed for headache or mild pain.    [name]. amLODipine (NORVASC) 5 MG tablet TAKE 1/2 TABLET BY MOUTH ONCE DAILY 45 tablet 2  . atorvastatin (LIPITOR) 20 MG tablet Take 0.5 tablets (10 mg total) by mouth daily. 90 tablet 2  . carvedilol (COREG) 12.5 MG tablet TAKE 1 TABLET BY MOUTH TWICE DAILY WITH A MEAL 60 tablet 11  . Cholecalciferol ([employer]S VITAMIN D3) 1000 UNITS capsule Take 1 capsule (1,000 Units total) by mouth daily. 100 capsule 3  . clopidogrel (PLAVIX) 75 MG tablet TAKE 1 TABLET BY MOUTH EVERY DAY 30 tablet 10  . lisinopril (PRINIVIL,ZESTRIL) 5 MG tablet TAKE 1 TABLET BY MOUTH DAILY 90 tablet 2  . mirtazapine (REMERON) 15 MG tablet TAKE 1 TABLET BY MOUTH EVERY NIGHT AT BEDTIME 90 tablet 1  . NAMZARIC 28-10 MG CP24 TAKE 1 CAPSULE BY MOUTH DAILY 30 capsule 5  . nitroGLYCERIN (NITROSTAT) 0.4 MG SL tablet Place 1 tablet (0.4 mg total) under the tongue every 5 (five) minutes as needed for chest pain. 25 tablet 2  . traMADol-acetaminophen (ULTRACET) 37.5-325 MG tablet Take 0.5-1 tablets by mouth 2 (two) times daily as needed. 60 tablet 2   No facility-administered medications prior to visit.     ROS Review of Systems  Constitutional: Negative for activity change, appetite change, chills, fatigue and unexpected weight change.  HENT: Negative for congestion, mouth sores and sinus pressure.   Eyes: Negative for visual disturbance.  Respiratory: Negative for cough and chest tightness.   Gastrointestinal: Negative for abdominal pain and nausea.  Genitourinary: Negative for difficulty urinating, frequency and vaginal pain.    Musculoskeletal: Negative for back pain and gait problem.  Skin: Negative for pallor and rash.  Neurological: Negative for dizziness, tremors, weakness, numbness and headaches.  Psychiatric/Behavioral: Positive for agitation, behavioral problems, confusion, decreased concentration and sleep disturbance. Negative for suicidal ideas. The patient is nervous/anxious.     Objective:  BP (!) 160/80 (BP Location: Left Arm, Patient Position: Sitting, Cuff Size: Normal)   Pulse 64   Temp (!) 97.5 F (36.4 C) (Oral)   Ht 5\' 3"  (1.6 m)   Wt 121 lb 4 oz (55 kg)   SpO2 98%   BMI 21.48 kg/m   BP Readings from Last 3 Encounters:  10/13/17 (!) 160/80  07/13/17 132/64  06/01/17 126/68    Wt Readings from Last 3 Encounters:  10/13/17 121 lb 4 oz (55 kg)  07/13/17 122 lb (55.3 kg)  06/01/17 120 lb (54.4 kg)    Physical Exam  Constitutional: She appears well-developed. No distress.  HENT:  Head: Normocephalic.  Right Ear: External ear normal.  Left Ear: External ear normal.  Nose: Nose normal.  Mouth/Throat: Oropharynx is clear and moist.  Eyes: Conjunctivae are normal. Pupils are equal, round, and reactive to light. Right eye exhibits no discharge. Left eye exhibits no discharge.  Neck: Normal range of motion. Neck supple. No JVD present. No tracheal deviation present. No thyromegaly present.  Cardiovascular: Normal rate, regular rhythm and normal heart sounds.  Pulmonary/Chest: No stridor. No respiratory distress. She has no  wheezes.  Abdominal: Soft. Bowel sounds are normal. She exhibits no distension and no mass. There is no tenderness. There is no rebound and no guarding.  Musculoskeletal: She exhibits no edema or tenderness.  Lymphadenopathy:    She has no cervical adenopathy.  Neurological: She displays normal reflexes. No cranial nerve deficit. She exhibits normal muscle tone. Coordination normal.  Skin: No rash noted. No erythema.  Psychiatric: She has a normal mood and affect.  Her behavior is normal.  confused, pleasant  Lab Results  Component Value Date   WBC 5.6 07/08/2016   HGB 13.5 07/08/2016   HCT 38.6 07/08/2016   PLT 192.0 07/08/2016   GLUCOSE 105 (H) 05/28/2017   CHOL 145 05/28/2017   TRIG 228.0 (H) 05/28/2017   HDL 41.00 05/28/2017   LDLDIRECT 61.0 05/28/2017   LDLCALC 60 09/06/2015   ALT 12 05/28/2017   AST 13 05/28/2017   NA 142 05/28/2017   K 4.4 05/28/2017   CL 105 05/28/2017   CREATININE 1.00 05/28/2017   BUN 20 05/28/2017   CO2 28 05/28/2017   TSH 2.64 05/28/2017   INR 0.97 03/07/2015    Dg Lumbar Spine 2-3 Views  Result Date: 06/01/2017 CLINICAL DATA:  Chronic low back pain, worse during the past week. No known injury. EXAM: LUMBAR SPINE - 2-3 VIEW COMPARISON:  03/13/2015 FINDINGS: There are 5 non rib-bearing lumbar type vertebral bodies. Mild scoliotic curvature of the thoracolumbar spine with dominant caudal component convex the right, unchanged. There is mild straightening expected lumbar lordosis, unchanged. Unchanged mild (approximately 4 mm) of anterolisthesis of L4 upon L5. Lumbar vertebral body heights appear preserved Moderate to severe multilevel lumbar spine DDD, likely worse at L4-L5 with disc space height loss, endplate irregularity and sclerosis, progressed compared to the 03/2015 examination. Limited visualization of the bilateral SI joints is normal Calcified atherosclerotic plaque within the abdominal aorta. Regional bowel gas pattern is normal. IMPRESSION: 1. No acute findings. 2. Moderate severe multilevel lumbar spine DDD, likely worse at L4-L5, progressed compared to the 03/2015 examination. 3.  Aortic Atherosclerosis (ICD10-I70.0). Electronically Signed   By: Simonne ComeJohn  Watts M.D.   On: 06/01/2017 12:42    Assessment & Plan:   There are no diagnoses linked to this encounter. I am having [name] maintain her acetaminophen, nitroGLYCERIN, Cholecalciferol, amLODipine, clopidogrel, lisinopril, mirtazapine, NAMZARIC,  atorvastatin, carvedilol, and traMADol-acetaminophen.  No orders of the defined types were placed in this encounter.    Follow-up: No Follow-up on file.  Sonda PrimesAlex Plotnikov, MD

## 2017-10-13 NOTE — Assessment & Plan Note (Addendum)
[facility]

## 2017-10-13 NOTE — Assessment & Plan Note (Signed)
Coreg, Amlodipine Lisinopril

## 2017-10-13 NOTE — Addendum Note (Signed)
Addended by: Verlan FriendsAIRRIKIER DAVIDSON, Kazuma Elena M on: 10/13/2017 04:32 PM   Modules accepted: Orders

## 2017-10-25 ENCOUNTER — Other Ambulatory Visit: Payer: Self-pay

## 2017-10-25 MED ORDER — AMLODIPINE BESYLATE 5 MG PO TABS: 2.5000 mg | ORAL_TABLET | Freq: Every day | ORAL | 2 refills | Status: DC

## 2017-11-04 ENCOUNTER — Other Ambulatory Visit: Payer: Self-pay

## 2017-11-04 MED ORDER — MIRTAZAPINE 15 MG PO TABS: 15.0000 mg | ORAL_TABLET | Freq: Every day | ORAL | 1 refills | Status: DC

## 2017-12-02 ENCOUNTER — Emergency Department (HOSPITAL_COMMUNITY)
Admission: EM | Admit: 2017-12-02 | Discharge: 2017-12-03 | Disposition: A | Discharge status: Home / Self Care | Payer: Medicare Other | Attending: Emergency Medicine | Admitting: Emergency Medicine

## 2017-12-02 ENCOUNTER — Other Ambulatory Visit: Payer: Self-pay

## 2017-12-02 ENCOUNTER — Encounter (HOSPITAL_COMMUNITY): Payer: Self-pay

## 2017-12-02 ENCOUNTER — Emergency Department (HOSPITAL_COMMUNITY): Payer: Medicare Other

## 2017-12-02 DIAGNOSIS — Z79899 Other long term (current) drug therapy: Secondary | ICD-10-CM | POA: Insufficient documentation

## 2017-12-02 DIAGNOSIS — Y929 Unspecified place or not applicable: Secondary | ICD-10-CM | POA: Diagnosis not present

## 2017-12-02 DIAGNOSIS — I10 Essential (primary) hypertension: Secondary | ICD-10-CM | POA: Insufficient documentation

## 2017-12-02 DIAGNOSIS — Y939 Activity, unspecified: Secondary | ICD-10-CM | POA: Insufficient documentation

## 2017-12-02 DIAGNOSIS — Y998 Other external cause status: Secondary | ICD-10-CM | POA: Insufficient documentation

## 2017-12-02 DIAGNOSIS — Y33XXXA Other specified events, undetermined intent, initial encounter: Secondary | ICD-10-CM | POA: Diagnosis not present

## 2017-12-02 DIAGNOSIS — N390 Urinary tract infection, site not specified: Secondary | ICD-10-CM | POA: Insufficient documentation

## 2017-12-02 DIAGNOSIS — R55 Syncope and collapse: Secondary | ICD-10-CM | POA: Diagnosis not present

## 2017-12-02 DIAGNOSIS — S8001XA Contusion of right knee, initial encounter: Secondary | ICD-10-CM | POA: Insufficient documentation

## 2017-12-02 DIAGNOSIS — F0390 Unspecified dementia without behavioral disturbance: Secondary | ICD-10-CM | POA: Insufficient documentation

## 2017-12-02 DIAGNOSIS — I252 Old myocardial infarction: Secondary | ICD-10-CM | POA: Insufficient documentation

## 2017-12-02 DIAGNOSIS — M25561 Pain in right knee: Secondary | ICD-10-CM | POA: Diagnosis not present

## 2017-12-02 DIAGNOSIS — E785 Hyperlipidemia, unspecified: Secondary | ICD-10-CM | POA: Insufficient documentation

## 2017-12-02 DIAGNOSIS — I2510 Atherosclerotic heart disease of native coronary artery without angina pectoris: Secondary | ICD-10-CM | POA: Insufficient documentation

## 2017-12-02 DIAGNOSIS — R4182 Altered mental status, unspecified: Secondary | ICD-10-CM | POA: Diagnosis present

## 2017-12-02 LAB — CBC
HEMATOCRIT: 42.5 % (ref 36.0–46.0)
Hemoglobin: 13.4 g/dL (ref 12.0–15.0)
MCH: 29.9 pg (ref 26.0–34.0)
MCHC: 31.5 g/dL (ref 30.0–36.0)
MCV: 94.9 fL (ref 78.0–100.0)
Platelets: 182 10*3/uL (ref 150–400)
RBC: 4.48 MIL/uL (ref 3.87–5.11)
RDW: 13.9 % (ref 11.5–15.5)
WBC: 6.8 10*3/uL (ref 4.0–10.5)

## 2017-12-02 LAB — COMPREHENSIVE METABOLIC PANEL
ALT: 20 U/L (ref 14–54)
AST: 27 U/L (ref 15–41)
Albumin: 4 g/dL (ref 3.5–5.0)
Alkaline Phosphatase: 69 U/L (ref 38–126)
Anion gap: 8 (ref 5–15)
BUN: 26 mg/dL — ABNORMAL HIGH (ref 6–20)
CHLORIDE: 107 mmol/L (ref 101–111)
CO2: 27 mmol/L (ref 22–32)
Calcium: 9.6 mg/dL (ref 8.9–10.3)
Creatinine, Ser: 0.98 mg/dL (ref 0.44–1.00)
GFR, EST AFRICAN AMERICAN: 58 mL/min — AB (ref >60)
GFR, EST NON AFRICAN AMERICAN: 50 mL/min — AB (ref >60)
Glucose, Bld: 100 mg/dL — ABNORMAL HIGH (ref 65–99)
POTASSIUM: 4.3 mmol/L (ref 3.5–5.1)
Sodium: 142 mmol/L (ref 135–145)
Total Bilirubin: 0.6 mg/dL (ref 0.3–1.2)
Total Protein: 7.2 g/dL (ref 6.5–8.1)

## 2017-12-02 LAB — URINALYSIS, ROUTINE W REFLEX MICROSCOPIC
BILIRUBIN URINE: NEGATIVE
Glucose, UA: NEGATIVE mg/dL
Ketones, ur: NEGATIVE mg/dL
Leukocytes, UA: NEGATIVE
Nitrite: NEGATIVE
PH: 6 (ref 5.0–8.0)
Protein, ur: NEGATIVE mg/dL
SPECIFIC GRAVITY, URINE: 1.024 (ref 1.005–1.030)

## 2017-12-02 LAB — AMMONIA: Ammonia: 32 umol/L (ref 9–35)

## 2017-12-02 LAB — CBG MONITORING, ED: Glucose-Capillary: 97 mg/dL (ref 65–99)

## 2017-12-02 NOTE — ED Notes (Signed)
Patient transported to CT 

## 2017-12-02 NOTE — ED Provider Notes (Signed)
[facility] Provider Note   CSN: [id number] Arrival date & time: 12/02/17  1526     History   Chief Complaint Chief Complaint  Patient presents with  . Altered Mental Status    HPI [name] is a [age] y.o. female.  Brought in by her family with complaint of altered mental status and knee pain.  They state that she is complaining of pain on her knee and would not ambulate on it.  They noticed bruising.  They are unsure if she had a fall.  Patient lives at home.  They state that she also seemed to be more "out of it."  A bit more sleepy than normal just not acting herself.  She has a history of urinary tract infections.  States she has not had a recent cold, cough or fevers. There is a level 5 caveat due to dementia.  HPI  Past Medical History:  Diagnosis Date  . ANXIETY   . CORONARY ARTERY DISEASE    a. 11/2000 Cath/PTCA: LM 20d, LAD 99p (PTCA), LCX 70p, RCA dom, 2765m, EF 30 dist ant AK;  b. 2008 Myoview: EF 75%, small ant apical scar, mild peri-infarct ischemia.  [name]. DEMENTIA   . Dementia   . DEPRESSION   . DIVERTICULOSIS, COLON   . HERPES ZOSTER   . History of Ischemic Cardiomyopathy    a. 11/2000 LV gram: EF 30%, dist ant AK;  b. 2008 MV EF 75%.  [name]. Hx of echocardiogram    Echo (8/15):  EF 55%, mild apical HK, Gr 1 DD, mild AI, mild MR, severe LAE, mod RAE, mod PI, PASP 38 mmHg  . HYPERLIPIDEMIA   . HYPERTENSION   . OSTEOPOROSIS   . VERTIGO     Patient Active Problem List   Diagnosis Date Noted  . Low back pain 03/13/2015  . NSTEMI (non-ST elevated myocardial infarction) (HCC) 05/26/2014  . Acute URI 03/06/2014  . Pneumonia involving right lung 03/06/2014  . Anogenital lichen sclerosus 11/01/2013  . Well adult exam 05/11/2013  . Chills 04/04/2013  . Chronic low back pain 05/30/2012  . Grief reaction 07/15/2011  . Fatigue 04/21/2011  . Dizzy spells 04/21/2011  . Sinusitis, chronic 04/21/2011  . ALLERGIC RHINITIS 02/16/2011  .  Alzheimer's dementia 11/01/2010  . Anxiety state 10/28/2010  . ACTINIC KERATOSIS 10/28/2010  . Cystitis 08/22/2009  . HERPES ZOSTER 05/23/2009  . Situational depression 12/05/2008  . VERTIGO 04/16/2008  . Memory loss 04/16/2008  . Dyslipidemia 10/25/2007  . Essential hypertension 10/25/2007  . DIVERTICULOSIS, COLON 10/25/2007  . Coronary atherosclerosis 09/26/2007  . OSTEOPOROSIS 09/26/2007    Past Surgical History:  Procedure Laterality Date  . LEFT HEART CATHETERIZATION WITH CORONARY ANGIOGRAM N/A 05/28/2014   Procedure: LEFT HEART CATHETERIZATION WITH CORONARY ANGIOGRAM;  Surgeon: Kathleene Hazelhristopher D McAlhany, MD;  [facility];  Service: Cardiovascular;  Laterality: N/A;  . PERCUTANEOUS CORONARY STENT INTERVENTION (PCI-S)  05/28/2014   Procedure: PERCUTANEOUS CORONARY STENT INTERVENTION (PCI-S);  Surgeon: Kathleene Hazelhristopher D McAlhany, MD;  [facility];  Service: Cardiovascular;;  . PTCA  12/09/2000   successful; of the proximal left anterior descending with reduction of 99% narrowing to 30% with improvement of TIMI grade 1 to TIMI grade 3 flow  . TONSILLECTOMY AND ADENOIDECTOMY  1943    OB History    Gravida Para Term Preterm AB Living   2 2       2    SAB TAB Ectopic Multiple Live Births  Home Medications    Prior to Admission medications   Medication Sig Start Date End Date Taking? Authorizing Provider  acetaminophen (TYLENOL) 325 MG tablet Take 2 tablets (650 mg total) by mouth every 4 (four) hours as needed for headache or mild pain. 05/29/14  Yes Kilroy, Luke K, PA-C  amLODipine (NORVASC) 5 MG tablet Take 0.5 tablets (2.5 mg total) by mouth daily. 10/25/17  Yes Plotnikov, Georgina Quint, MD  atorvastatin (LIPITOR) 20 MG tablet Take 0.5 tablets (10 mg total) by mouth daily. 07/13/17  Yes Plotnikov, Georgina Quint, MD  carvedilol (COREG) 12.5 MG tablet TAKE 1 TABLET BY MOUTH TWICE DAILY WITH A MEAL 08/29/17  Yes Plotnikov, Georgina Quint, MD  Cholecalciferol ([employer]  VITAMIN D3) 1000 UNITS capsule Take 1 capsule (1,000 Units total) by mouth daily. 12/19/14  Yes Plotnikov, Georgina Quint, MD  clopidogrel (PLAVIX) 75 MG tablet TAKE 1 TABLET BY MOUTH EVERY DAY 02/17/17  Yes Kathleene Hazel, MD  lisinopril (PRINIVIL,ZESTRIL) 5 MG tablet TAKE 1 TABLET BY MOUTH DAILY 02/17/17  Yes Plotnikov, Georgina Quint, MD  mirtazapine (REMERON) 15 MG tablet Take 1 tablet (15 mg total) by mouth at bedtime. 11/04/17  Yes Plotnikov, Georgina Quint, MD  NAMZARIC 28-10 MG CP24 TAKE 1 CAPSULE BY MOUTH DAILY 04/13/17  Yes Plotnikov, Georgina Quint, MD  nitroGLYCERIN (NITROSTAT) 0.4 MG SL tablet Place 1 tablet (0.4 mg total) under the tongue every 5 (five) minutes as needed for chest pain. 05/29/14  Yes Kilroy, Eda Paschal, PA-C  traMADol-acetaminophen (ULTRACET) 37.5-325 MG tablet Take 0.5-1 tablets by mouth 2 (two) times daily as needed. Patient taking differently: Take 0.5-1 tablets by mouth 2 (two) times daily. 0.5 tablet in the morning and 1 tablet in the evening 09/24/17  Yes Plotnikov, Georgina Quint, MD  cephALEXin (KEFLEX) 500 MG capsule 2 caps po bid x 7 days 12/03/17   Arthor Captain, PA-C    Family History Family History  Problem Relation Age of Onset  . Diabetes Mother   . Heart attack Mother   . Sudden death Mother   . Hypertension Other   . Heart disease Other   . Coronary artery disease Other   . Hypertension Daughter     Social History Social History   Tobacco Use  . Smoking status: Never Smoker  . Smokeless tobacco: Never Used  Substance Use Topics  . Alcohol use: No  . Drug use: No     Allergies   Simvastatin   Review of Systems Review of Systems Ten systems reviewed and are negative for acute change, except as noted in the HPI.    Physical Exam Updated Vital Signs BP (!) 143/63   Pulse 60   Temp 98.4 F (36.9 C) (Oral)   Resp 17   Ht 5\' 2"  (1.575 m)   Wt 54.4 kg (120 lb)   SpO2 97%   BMI 21.95 kg/m   Physical Exam  Constitutional: She is oriented to  person, place, and time. She appears well-developed and well-nourished. No distress.  HENT:  Head: Normocephalic and atraumatic.  Eyes: Conjunctivae are normal. No scleral icterus.  Neck: Normal range of motion.  Cardiovascular: Normal rate, regular rhythm and normal heart sounds. Exam reveals no gallop and no friction rub.  No murmur heard. Pulmonary/Chest: Effort normal and breath sounds normal. No respiratory distress.  Abdominal: Soft. Bowel sounds are normal. She exhibits no distension and no mass. There is no tenderness. There is no guarding.  Musculoskeletal:  Bruising over the right knee, tenderness at  the site of the bruise.  Some of it appears old.  Full range of motion without pain.  Normal ipsilateral hip and ankle exam  Neurological: She is alert and oriented to person, place, and time.  Skin: Skin is warm and dry. She is not diaphoretic.  Psychiatric: Her behavior is normal.  Nursing note and vitals reviewed.    ED Treatments / Results  Labs (all labs ordered are listed, but only abnormal results are displayed) Labs Reviewed  COMPREHENSIVE METABOLIC PANEL - Abnormal; Notable for the following components:      Result Value   Glucose, Bld 100 (*)    BUN 26 (*)    GFR calc non Af Amer 50 (*)    GFR calc Af Amer 58 (*)    All other components within normal limits  URINALYSIS, ROUTINE W REFLEX MICROSCOPIC - Abnormal; Notable for the following components:   APPearance HAZY (*)    Hgb urine dipstick MODERATE (*)    Bacteria, UA MANY (*)    Squamous Epithelial / LPF 0-5 (*)    All other components within normal limits  CBC  AMMONIA  CBG MONITORING, ED    EKG  EKG Interpretation None       Radiology Ct Head Wo Contrast  Result Date: 12/02/2017 CLINICAL DATA:  Dementia, patient found down on kitchen floor last evening. EXAM: CT HEAD WITHOUT CONTRAST TECHNIQUE: Contiguous axial images were obtained from the base of the skull through the vertex without intravenous  contrast. COMPARISON:  01/20/2010 FINDINGS: Brain: Chronic small vessel ischemic disease and atrophy. Chronic bilateral basal ganglial lacunar infarcts. No acute intracranial hemorrhage, midline shift or edema. No intra-axial mass nor extra-axial fluid collections. No hydrocephalus. No effacement of the basal cisterns or fourth ventricle. Vascular: Moderate atherosclerosis of the carotid siphons. Skull: Negative for fracture or focal lesion. Sinuses/Orbits: No acute finding. Other: None. IMPRESSION: 1. Chronic stable small vessel ischemic disease of periventricular white matter with chronic bilateral basal ganglial lacunar infarcts. 2. No acute intracranial abnormality. Electronically Signed   By: Tollie Eth M.D.   On: 12/02/2017 20:26   Dg Knee Complete 4 Views Right  Result Date: 12/02/2017 CLINICAL DATA:  Recent fall with right knee pain, initial encounter EXAM: RIGHT KNEE - COMPLETE 4+ VIEW COMPARISON:  None. FINDINGS: No evidence of fracture, dislocation, or joint effusion. No evidence of arthropathy or other focal bone abnormality. Soft tissues are unremarkable. IMPRESSION: No acute abnormality noted. Electronically Signed   By: Alcide Clever M.D.   On: 12/02/2017 21:01    Procedures Procedures (including critical care time)  Medications Ordered in ED Medications  cefTRIAXone (ROCEPHIN) 1 g in dextrose 5 % 50 mL IVPB (0 g Intravenous Incomplete 12/03/17 0029)     Initial Impression / Assessment and Plan / ED Course  I have reviewed the triage vital signs and the nursing notes.  Pertinent labs & imaging results that were available during my care of the patient were reviewed by me and considered in my medical decision making (see chart for details).  Clinical Course as of Dec 03 32  Caleen Essex Dec 03, 2017  0008 Patient with UTI. Will treat with Rocephin and d/c with keflex.  [AH]    Clinical Course User Index [AH] Arthor Captain, PA-C   CT negative patient strain negative for any acute  fractures.  She appears to have a urinary tract infection will be treated here with Rocephin and discharged with Keflex.  She appears appropriate for discharge.  No  other significant abnormal lab findings.  Final Clinical Impressions(s) / ED Diagnoses   Final diagnoses:  Contusion of right knee, initial encounter  Urinary tract infection without hematuria, site unspecified    ED Discharge Orders        Ordered    cephALEXin (KEFLEX) 500 MG capsule     12/03/17 0034       Arthor Captain, PA-C 12/03/17 1610    Rolan Bucco, MD 12/04/17 [phone]

## 2017-12-02 NOTE — ED Notes (Addendum)
Pt found on floor at 4am by family; family states that they are unsure if patient fell but pt was c/o of pain to R knee and left chest area. The suspected fall was reported to potentially be 11/30/17. Pt A/Ox1, which family states that patient is usually a lil more alert

## 2017-12-02 NOTE — ED Triage Notes (Signed)
Per Family, Pt is coming from home where she started to have some altered mental status two nights ago. Family reports that she was restless and she kept turning off and on the light when she was going to bed. In the middle of the night at 0400 they found the patient sitting in the middle of the floor in the living room and she kept saying, "Thank you, thank you I've been here for such a long time." Family had gone to bed at 1230 and everything was fine. Reports some knee pain, but patient seems to be more lethargic than usual. Hx of memory impairment, but patient is worse than normal.

## 2017-12-02 NOTE — ED Notes (Signed)
Purewick applied.

## 2017-12-03 MED ORDER — CEPHALEXIN 500 MG PO CAPS: ORAL_CAPSULE | ORAL | 0 refills | Status: DC

## 2017-12-03 MED ORDER — DEXTROSE 5 % IV SOLN
1.0000 g | Freq: Once | INTRAVENOUS | Status: AC
  Administered 2017-12-03: 1 g via INTRAVENOUS
  Filled 2017-12-03: qty 10

## 2017-12-03 NOTE — Discharge Instructions (Signed)
Contact a health care provider if: °You have back pain. °You have a fever. °You feel nauseous or vomit. °Your symptoms do not get better after 3 days. °Your symptoms go away and then return. °Get help right away if: °You have severe back pain or lower abdominal pain. °You are vomiting and cannot keep down any medicines or water. °

## 2017-12-03 NOTE — ED Notes (Signed)
Pt discharged from ED; instructions provided and scripts given; Pt encouraged to return to ED if symptoms worsen and to f/u with PCP; Pt verbalized understanding of all instructions 

## 2017-12-08 ENCOUNTER — Other Ambulatory Visit (INDEPENDENT_AMBULATORY_CARE_PROVIDER_SITE_OTHER): Payer: Medicare Other

## 2017-12-08 ENCOUNTER — Encounter: Payer: Self-pay | Admitting: Internal Medicine

## 2017-12-08 ENCOUNTER — Ambulatory Visit (INDEPENDENT_AMBULATORY_CARE_PROVIDER_SITE_OTHER): Payer: Medicare Other | Admitting: Internal Medicine

## 2017-12-08 VITALS — BP 148/82 | HR 58 | Temp 97.8°F | Ht 62.0 in | Wt 120.0 lb

## 2017-12-08 DIAGNOSIS — R634 Abnormal weight loss: Secondary | ICD-10-CM | POA: Diagnosis not present

## 2017-12-08 DIAGNOSIS — I10 Essential (primary) hypertension: Secondary | ICD-10-CM

## 2017-12-08 DIAGNOSIS — I2510 Atherosclerotic heart disease of native coronary artery without angina pectoris: Secondary | ICD-10-CM

## 2017-12-08 DIAGNOSIS — Z111 Encounter for screening for respiratory tuberculosis: Secondary | ICD-10-CM | POA: Diagnosis not present

## 2017-12-08 DIAGNOSIS — F0281 Dementia in other diseases classified elsewhere with behavioral disturbance: Secondary | ICD-10-CM

## 2017-12-08 DIAGNOSIS — G301 Alzheimer's disease with late onset: Secondary | ICD-10-CM

## 2017-12-08 LAB — BASIC METABOLIC PANEL
BUN: 17 mg/dL (ref 6–23)
CALCIUM: 9.6 mg/dL (ref 8.4–10.5)
CO2: 29 meq/L (ref 19–32)
CREATININE: 0.84 mg/dL (ref 0.40–1.20)
Chloride: 102 mEq/L (ref 96–112)
GFR: 67.96 mL/min (ref >60.00)
Glucose, Bld: 105 mg/dL — ABNORMAL HIGH (ref 70–99)
Potassium: 5.6 mEq/L — ABNORMAL HIGH (ref 3.5–5.1)
Sodium: 138 mEq/L (ref 135–145)

## 2017-12-08 NOTE — Assessment & Plan Note (Signed)
Coreg, Amlodipine On Lisinopril

## 2017-12-08 NOTE — Assessment & Plan Note (Signed)
Worse Gold test The pt started to wonder; considering [facility]

## 2017-12-08 NOTE — Assessment & Plan Note (Signed)
Chronic. No angina Lipitor, Amlodipine, Plavix, Coreg

## 2017-12-08 NOTE — Progress Notes (Signed)
Subjective:  Patient ID: [name], female    DOB: [birth year]  Age: 84 y.o. MRN: [id number]  CC: No chief complaint on file.   HPI [name] presents for dementia, CAD, HTN f/u The pt started to wonder out; considering [facility]  Outpatient Medications Prior to Visit  Medication Sig Dispense Refill  . acetaminophen (TYLENOL) 325 MG tablet Take 2 tablets (650 mg total) by mouth every 4 (four) hours as needed for headache or mild pain.    [name] amLODipine (NORVASC) 5 MG tablet Take 0.5 tablets (2.5 mg total) by mouth daily. 45 tablet 2  . atorvastatin (LIPITOR) 20 MG tablet Take 0.5 tablets (10 mg total) by mouth daily. 90 tablet 2  . carvedilol (COREG) 12.5 MG tablet TAKE 1 TABLET BY MOUTH TWICE DAILY WITH A MEAL 60 tablet 11  . cephALEXin (KEFLEX) 500 MG capsule 2 caps po bid x 7 days 28 capsule 0  . Cholecalciferol ([employer] VITAMIN D3) 1000 UNITS capsule Take 1 capsule (1,000 Units total) by mouth daily. 100 capsule 3  . clopidogrel (PLAVIX) 75 MG tablet TAKE 1 TABLET BY MOUTH EVERY DAY 30 tablet 10  . lisinopril (PRINIVIL,ZESTRIL) 5 MG tablet TAKE 1 TABLET BY MOUTH DAILY 90 tablet 2  . mirtazapine (REMERON) 15 MG tablet Take 1 tablet (15 mg total) by mouth at bedtime. 90 tablet 1  . NAMZARIC 28-10 MG CP24 TAKE 1 CAPSULE BY MOUTH DAILY 30 capsule 5  . nitroGLYCERIN (NITROSTAT) 0.4 MG SL tablet Place 1 tablet (0.4 mg total) under the tongue every 5 (five) minutes as needed for chest pain. 25 tablet 2  . traMADol-acetaminophen (ULTRACET) 37.5-325 MG tablet Take 0.5-1 tablets by mouth 2 (two) times daily as needed. (Patient taking differently: Take 0.5-1 tablets by mouth 2 (two) times daily. 0.5 tablet in the morning and 1 tablet in the evening) 60 tablet 2   No facility-administered medications prior to visit.     ROS Review of Systems  Constitutional: Positive for fatigue. Negative for activity change, appetite change, chills and unexpected weight change.  HENT: Negative for  congestion, mouth sores and sinus pressure.   Eyes: Negative for visual disturbance.  Respiratory: Negative for cough and chest tightness.   Gastrointestinal: Negative for abdominal pain and nausea.  Genitourinary: Negative for difficulty urinating, frequency and vaginal pain.  Musculoskeletal: Negative for back pain and gait problem.  Skin: Negative for pallor and rash.  Neurological: Negative for dizziness, tremors, weakness, numbness and headaches.  Psychiatric/Behavioral: Positive for behavioral problems, confusion and decreased concentration. Negative for sleep disturbance.    Objective:  BP (!) 148/82 (BP Location: Left Arm, Patient Position: Sitting, Cuff Size: Normal)   Pulse (!) 58   Temp 97.8 F (36.6 C) (Oral)   Ht 5\' 2"  (1.575 m)   Wt 120 lb (54.4 kg)   SpO2 100%   BMI 21.95 kg/m   BP Readings from Last 3 Encounters:  12/08/17 (!) 148/82  12/03/17 140/62  10/13/17 (!) 160/80    Wt Readings from Last 3 Encounters:  12/08/17 120 lb (54.4 kg)  12/02/17 120 lb (54.4 kg)  10/13/17 121 lb 4 oz (55 kg)    Physical Exam  Constitutional: She appears well-developed. No distress.  HENT:  Head: Normocephalic.  Right Ear: External ear normal.  Left Ear: External ear normal.  Nose: Nose normal.  Mouth/Throat: Oropharynx is clear and moist.  Eyes: Conjunctivae are normal. Pupils are equal, round, and reactive to light. Right eye exhibits no  discharge. Left eye exhibits no discharge.  Neck: Normal range of motion. Neck supple. No JVD present. No tracheal deviation present. No thyromegaly present.  Cardiovascular: Normal rate, regular rhythm and normal heart sounds.  Pulmonary/Chest: No stridor. No respiratory distress. She has no wheezes.  Abdominal: Soft. Bowel sounds are normal. She exhibits no distension and no mass. There is no tenderness. There is no rebound and no guarding.  Musculoskeletal: She exhibits no edema or tenderness.  Lymphadenopathy:    She has no  cervical adenopathy.  Neurological: She displays normal reflexes. No cranial nerve deficit. She exhibits normal muscle tone. Coordination abnormal.  Skin: No rash noted. No erythema.  Psychiatric: She has a normal mood and affect. Her behavior is normal.  confused, pleasant  Lab Results  Component Value Date   WBC 6.8 12/02/2017   HGB 13.4 12/02/2017   HCT 42.5 12/02/2017   PLT 182 12/02/2017   GLUCOSE 100 (H) 12/02/2017   CHOL 145 05/28/2017   TRIG 228.0 (H) 05/28/2017   HDL 41.00 05/28/2017   LDLDIRECT 61.0 05/28/2017   LDLCALC 60 09/06/2015   ALT 20 12/02/2017   AST 27 12/02/2017   NA 142 12/02/2017   K 4.3 12/02/2017   CL 107 12/02/2017   CREATININE 0.98 12/02/2017   BUN 26 (H) 12/02/2017   CO2 27 12/02/2017   TSH 2.64 05/28/2017   INR 0.97 03/07/2015    Ct Head Wo Contrast  Result Date: 12/02/2017 CLINICAL DATA:  Dementia, patient found down on kitchen floor last evening. EXAM: CT HEAD WITHOUT CONTRAST TECHNIQUE: Contiguous axial images were obtained from the base of the skull through the vertex without intravenous contrast. COMPARISON:  01/20/2010 FINDINGS: Brain: Chronic small vessel ischemic disease and atrophy. Chronic bilateral basal ganglial lacunar infarcts. No acute intracranial hemorrhage, midline shift or edema. No intra-axial mass nor extra-axial fluid collections. No hydrocephalus. No effacement of the basal cisterns or fourth ventricle. Vascular: Moderate atherosclerosis of the carotid siphons. Skull: Negative for fracture or focal lesion. Sinuses/Orbits: No acute finding. Other: None. IMPRESSION: 1. Chronic stable small vessel ischemic disease of periventricular white matter with chronic bilateral basal ganglial lacunar infarcts. 2. No acute intracranial abnormality. Electronically Signed   By: Tollie Ethavid  Kwon M.D.   On: 12/02/2017 20:26   Dg Knee Complete 4 Views Right  Result Date: 12/02/2017 CLINICAL DATA:  Recent fall with right knee pain, initial encounter EXAM:  RIGHT KNEE - COMPLETE 4+ VIEW COMPARISON:  None. FINDINGS: No evidence of fracture, dislocation, or joint effusion. No evidence of arthropathy or other focal bone abnormality. Soft tissues are unremarkable. IMPRESSION: No acute abnormality noted. Electronically Signed   By: Alcide CleverMark  Lukens M.D.   On: 12/02/2017 21:01    Assessment & Plan:   There are no diagnoses linked to this encounter. I am having [name] maintain her acetaminophen, nitroGLYCERIN, Cholecalciferol, clopidogrel, lisinopril, NAMZARIC, atorvastatin, carvedilol, traMADol-acetaminophen, amLODipine, mirtazapine, and cephALEXin.  No orders of the defined types were placed in this encounter.    Follow-up: No Follow-up on file.  Sonda PrimesAlex Aeisha Minarik, MD

## 2017-12-09 ENCOUNTER — Other Ambulatory Visit: Payer: Self-pay | Admitting: Internal Medicine

## 2017-12-09 ENCOUNTER — Other Ambulatory Visit: Payer: Medicare Other

## 2017-12-09 ENCOUNTER — Other Ambulatory Visit: Payer: Self-pay

## 2017-12-09 DIAGNOSIS — Z111 Encounter for screening for respiratory tuberculosis: Secondary | ICD-10-CM

## 2017-12-09 NOTE — Addendum Note (Signed)
Addended by: Scarlett PrestoFRIEDENBACH, Altagracia Rone on: 12/09/2017 02:49 PM   Modules accepted: Orders

## 2017-12-10 ENCOUNTER — Encounter: Payer: Self-pay | Admitting: Internal Medicine

## 2017-12-11 LAB — QUANTIFERON-TB GOLD PLUS
NIL: 2.63 IU/mL
QUANTIFERON-TB GOLD PLUS: NEGATIVE

## 2017-12-17 ENCOUNTER — Other Ambulatory Visit: Payer: Self-pay

## 2017-12-17 MED ORDER — ATORVASTATIN CALCIUM 20 MG PO TABS: 10.0000 mg | ORAL_TABLET | Freq: Every day | ORAL | 2 refills | Status: DC

## 2017-12-29 ENCOUNTER — Telehealth: Payer: Self-pay | Admitting: Internal Medicine

## 2017-12-29 ENCOUNTER — Encounter: Payer: Self-pay | Admitting: Family

## 2017-12-29 ENCOUNTER — Ambulatory Visit (INDEPENDENT_AMBULATORY_CARE_PROVIDER_SITE_OTHER): Payer: Medicare Other | Admitting: Family

## 2017-12-29 VITALS — BP 122/58 | HR 77 | Temp 98.0°F | Wt 127.0 lb

## 2017-12-29 DIAGNOSIS — H1032 Unspecified acute conjunctivitis, left eye: Secondary | ICD-10-CM | POA: Diagnosis not present

## 2017-12-29 MED ORDER — TOBRAMYCIN 0.3 % OP SOLN: 1.0000 [drp] | Freq: Four times a day (QID) | OPHTHALMIC | 0 refills | Status: DC

## 2017-12-29 NOTE — Progress Notes (Signed)
[name] is a 84 y.o. female with the following history as recorded in EpicCare:  Patient Active Problem List   Diagnosis Date Noted  . Low back pain 03/13/2015  . NSTEMI (non-ST elevated myocardial infarction) (HCC) 05/26/2014  . Acute URI 03/06/2014  . Pneumonia involving right lung 03/06/2014  . Anogenital lichen sclerosus 11/01/2013  . Well adult exam 05/11/2013  . Chills 04/04/2013  . Chronic low back pain 05/30/2012  . Grief reaction 07/15/2011  . Fatigue 04/21/2011  . Dizzy spells 04/21/2011  . Sinusitis, chronic 04/21/2011  . ALLERGIC RHINITIS 02/16/2011  . Alzheimer's dementia 11/01/2010  . Anxiety state 10/28/2010  . ACTINIC KERATOSIS 10/28/2010  . Cystitis 08/22/2009  . HERPES ZOSTER 05/23/2009  . Situational depression 12/05/2008  . VERTIGO 04/16/2008  . Memory loss 04/16/2008  . Dyslipidemia 10/25/2007  . Essential hypertension 10/25/2007  . DIVERTICULOSIS, COLON 10/25/2007  . Coronary atherosclerosis 09/26/2007  . OSTEOPOROSIS 09/26/2007    Current Outpatient Medications  Medication Sig Dispense Refill  . acetaminophen (TYLENOL) 325 MG tablet Take 2 tablets (650 mg total) by mouth every 4 (four) hours as needed for headache or mild pain.    [name] amLODipine (NORVASC) 5 MG tablet Take 0.5 tablets (2.5 mg total) by mouth daily. 45 tablet 2  . atorvastatin (LIPITOR) 20 MG tablet Take 0.5 tablets (10 mg total) by mouth daily. 90 tablet 2  . carvedilol (COREG) 12.5 MG tablet TAKE 1 TABLET BY MOUTH TWICE DAILY WITH A MEAL 60 tablet 11  . cephALEXin (KEFLEX) 500 MG capsule 2 caps po bid x 7 days 28 capsule 0  . Cholecalciferol (CVS VITAMIN D3) 1000 UNITS capsule Take 1 capsule (1,000 Units total) by mouth daily. 100 capsule 3  . clopidogrel (PLAVIX) 75 MG tablet TAKE 1 TABLET BY MOUTH EVERY DAY 30 tablet 10  . mirtazapine (REMERON) 15 MG tablet Take 1 tablet (15 mg total) by mouth at bedtime. 90 tablet 1  . NAMZARIC 28-10 MG CP24 TAKE 1 CAPSULE BY MOUTH DAILY 30  capsule 5  . nitroGLYCERIN (NITROSTAT) 0.4 MG SL tablet Place 1 tablet (0.4 mg total) under the tongue every 5 (five) minutes as needed for chest pain. 25 tablet 2  . traMADol-acetaminophen (ULTRACET) 37.5-325 MG tablet Take 0.5-1 tablets by mouth 2 (two) times daily as needed. (Patient taking differently: Take 0.5-1 tablets by mouth 2 (two) times daily. 0.5 tablet in the morning and 1 tablet in the evening) 60 tablet 2  . tobramycin (TOBREX) 0.3 % ophthalmic solution Place 1 drop into the left eye every 6 (six) hours. 5 mL 0   No current facility-administered medications for this visit.     Allergies: Simvastatin  Past Medical History:  Diagnosis Date  . ANXIETY   . CORONARY ARTERY DISEASE    a. 11/2000 Cath/PTCA: LM 20d, LAD 99p (PTCA), LCX 70p, RCA dom, 24m, EF 30 dist ant AK;  b. 2008 Myoview: EF 75%, small ant apical scar, mild peri-infarct ischemia.  [name] DEMENTIA   . Dementia   . DEPRESSION   . DIVERTICULOSIS, COLON   . HERPES ZOSTER   . History of Ischemic Cardiomyopathy    a. 11/2000 LV gram: EF 30%, dist ant AK;  b. 2008 MV EF 75%.  [name] Hx of echocardiogram    Echo (8/15):  EF 55%, mild apical HK, Gr 1 DD, mild AI, mild MR, severe LAE, mod RAE, mod PI, PASP 38 mmHg  . HYPERLIPIDEMIA   . HYPERTENSION   . OSTEOPOROSIS   .  VERTIGO     Past Surgical History:  Procedure Laterality Date  . LEFT HEART CATHETERIZATION WITH CORONARY ANGIOGRAM N/A 05/28/2014   Procedure: LEFT HEART CATHETERIZATION WITH CORONARY ANGIOGRAM;  Surgeon: Kathleene Hazelhristopher D McAlhany, MD;  [facility];  Service: Cardiovascular;  Laterality: N/A;  . PERCUTANEOUS CORONARY STENT INTERVENTION (PCI-S)  05/28/2014   Procedure: PERCUTANEOUS CORONARY STENT INTERVENTION (PCI-S);  Surgeon: Kathleene Hazelhristopher D McAlhany, MD;  [facility];  Service: Cardiovascular;;  . PTCA  12/09/2000   successful; of the proximal left anterior descending with reduction of 99% narrowing to 30% with improvement of TIMI grade 1 to TIMI  grade 3 flow  . TONSILLECTOMY AND ADENOIDECTOMY  1943    Family History  Problem Relation Age of Onset  . Diabetes Mother   . Heart attack Mother   . Sudden death Mother   . Hypertension Other   . Heart disease Other   . Coronary artery disease Other   . Hypertension Daughter     Social History   Tobacco Use  . Smoking status: Never Smoker  . Smokeless tobacco: Never Used  Substance Use Topics  . Alcohol use: No    Subjective:  Patient presents with concerns for possible infection left eye; is accompanied by her daughter; notes that left eye is is irritated, draining; has recently moved into a memory care facility within the past week and is with other residents; no cough or cold symptoms; no eye pain;   Objective:  Vitals:   12/29/17 1326  BP: (!) 122/58  Pulse: 77  Temp: 98 F (36.7 C)  SpO2: 99%  Weight: 127 lb (57.6 kg)    General: Well developed, well nourished, in no acute distress  Skin : Warm and dry.  Head: Normocephalic and atraumatic  Eyes: left Sclera and conjunctiva erythematous pupils round and reactive to light; extraocular movements intact  Ears: External normal; canals clear; tympanic membranes normal  Lungs: Respirations unlabored; clear to auscultation bilaterally without wheeze, rales, rhonchi  Neurologic: Alert and oriented; speech intact; face symmetrical; moves all extremities well; CNII-XII intact without focal deficit  Assessment:  1. Acute bacterial conjunctivitis of left eye     Plan:  Rx for Tobramycin Opht Solution 1 gtt to affected eye q 4-6 hours; change pillowcase tonight and tomorrow  Night; if no improvement by Friday, to follow-up with her eye doctor; daughter expresses understanding.   No Follow-up on file.  No orders of the defined types were placed in this encounter.   Requested Prescriptions   Signed Prescriptions Disp Refills  . tobramycin (TOBREX) 0.3 % ophthalmic solution 5 mL 0    Sig: Place 1 drop into the left eye  every 6 (six) hours.

## 2017-12-29 NOTE — Telephone Encounter (Signed)
Copied from CRM [id number]. Topic: General - Other >> Dec 29, 2017  9:07 AM Oneal GroutSebastian, Jennifer S wrote: Reason for CRM: Facility calling regarding needing clarification on dosing of traMADol-acetaminophen (ULTRACET) 37.5-325 MG tablet Call has disconnected

## 2018-01-04 ENCOUNTER — Telehealth: Payer: Self-pay | Admitting: Internal Medicine

## 2018-01-04 NOTE — Telephone Encounter (Signed)
Copied from CRM [phone]. Topic: Quick Communication - Rx Refill/Question >> Jan 04, 2018  4:04 PM Alexander BergeronBarksdale, Harvey B wrote: Medication:  nitroGLYCERIN (NITROSTAT) 0.4 MG SL tablet [[id number]][[id number]]   [facility] called to ask for the Rx contact them @ [phone] and fax [phone]   Preferred Pharmacy (with phone number or street name): [facility]   Agent: Please be advised that RX refills may take up to 3 business days. We ask that you follow-up with your pharmacy.

## 2018-01-05 MED ORDER — NITROGLYCERIN 0.4 MG SL SUBL: 0.4000 mg | SUBLINGUAL_TABLET | SUBLINGUAL | 0 refills | Status: AC | PRN

## 2018-01-05 NOTE — Telephone Encounter (Signed)
Reviewed chart pt is up-to-date sent refills to [facility].../lmb  

## 2018-01-14 DIAGNOSIS — H10503 Unspecified blepharoconjunctivitis, bilateral: Secondary | ICD-10-CM | POA: Diagnosis not present

## 2018-01-17 ENCOUNTER — Ambulatory Visit: Payer: Medicare Other | Admitting: Internal Medicine

## 2018-01-17 DIAGNOSIS — B301 Conjunctivitis due to adenovirus: Secondary | ICD-10-CM | POA: Diagnosis not present

## 2018-01-21 DIAGNOSIS — H02005 Unspecified entropion of left lower eyelid: Secondary | ICD-10-CM | POA: Diagnosis not present

## 2018-01-21 DIAGNOSIS — H02004 Unspecified entropion of left upper eyelid: Secondary | ICD-10-CM | POA: Diagnosis not present

## 2018-01-21 DIAGNOSIS — B301 Conjunctivitis due to adenovirus: Secondary | ICD-10-CM | POA: Diagnosis not present

## 2018-01-28 DIAGNOSIS — H02005 Unspecified entropion of left lower eyelid: Secondary | ICD-10-CM | POA: Diagnosis not present

## 2018-01-28 DIAGNOSIS — B301 Conjunctivitis due to adenovirus: Secondary | ICD-10-CM | POA: Diagnosis not present

## 2018-02-09 ENCOUNTER — Encounter: Payer: Self-pay | Admitting: Cardiovascular Disease

## 2018-02-09 ENCOUNTER — Ambulatory Visit: Payer: Medicare Other | Admitting: Cardiovascular Disease

## 2018-02-09 VITALS — BP 116/50 | HR 72 | Ht 62.0 in | Wt 139.0 lb

## 2018-02-09 DIAGNOSIS — I10 Essential (primary) hypertension: Secondary | ICD-10-CM | POA: Diagnosis not present

## 2018-02-09 DIAGNOSIS — E7800 Pure hypercholesterolemia, unspecified: Secondary | ICD-10-CM

## 2018-02-09 DIAGNOSIS — I2510 Atherosclerotic heart disease of native coronary artery without angina pectoris: Secondary | ICD-10-CM

## 2018-02-09 DIAGNOSIS — I340 Nonrheumatic mitral (valve) insufficiency: Secondary | ICD-10-CM

## 2018-02-09 NOTE — Patient Instructions (Signed)
Medication Instructions:  Your physician recommends that you continue on your current medications as directed. Please refer to the Current Medication list given to you today.   Labwork: none  Testing/Procedures: Your physician has requested that you have an echocardiogram. Echocardiography is a painless test that uses sound waves to create images of your heart. It provides your doctor with information about the size and shape of your heart and how well your heart's chambers and valves are working. This procedure takes approximately one hour. There are no restrictions for this procedure.    Follow-Up: Your physician recommends that you schedule a follow-up appointment in: 12 months. Please call our office in December to schedule this appointment    Any Other Special Instructions Will Be Listed Below (If Applicable).     If you need a refill on your cardiac medications before your next appointment, please call your pharmacy.

## 2018-02-09 NOTE — Progress Notes (Signed)
Chief Complaint  Patient presents with  . Follow-up    CAD      History of Present Illness: 84 y.o. female with history of CAD s/p PTCA to LAD in 2002 and stenting of the LAD and RCA in 2015, ischemic CM, HTN, HLD here today for cardiac follow up. She has been followed in the past by Dr. Daleen Squibb. She was admitted to [facility] in June 2015 with a non-STEMI. Cardiac catheterization demonstrated high-grade disease in the mid LAD and mid to distal RCA. She underwent PCI with placement of a DES in the mid LAD and DES in the distal RCA. Of note, echocardiogram did demonstrate an EF of 50% with moderate to severe mitral regurgitation suspicious for ischemic mitral regurgitation and papillary muscle dysfunction. She was seen by Tereso Newcomer, PA-C for hospital f/u and was feeling better. Echo 07/26/14 with normal LV function and mild MR.   She is here today for follow up. The patient denies any chest pain, dyspnea, palpitations, lower extremity edema, orthopnea, PND, dizziness, near syncope or syncope.   Primary Care Physician: Tresa Garter, MD  Past Medical History:  Diagnosis Date  . ANXIETY   . CORONARY ARTERY DISEASE    a. 11/2000 Cath/PTCA: LM 20d, LAD 99p (PTCA), LCX 70p, RCA dom, 74m, EF 30 dist ant AK;  b. 2008 Myoview: EF 75%, small ant apical scar, mild peri-infarct ischemia.  [name] DEMENTIA   . Dementia   . DEPRESSION   . DIVERTICULOSIS, COLON   . HERPES ZOSTER   . History of Ischemic Cardiomyopathy    a. 11/2000 LV gram: EF 30%, dist ant AK;  b. 2008 MV EF 75%.  [name] Hx of echocardiogram    Echo (8/15):  EF 55%, mild apical HK, Gr 1 DD, mild AI, mild MR, severe LAE, mod RAE, mod PI, PASP 38 mmHg  . HYPERLIPIDEMIA   . HYPERTENSION   . OSTEOPOROSIS   . VERTIGO     Past Surgical History:  Procedure Laterality Date  . LEFT HEART CATHETERIZATION WITH CORONARY ANGIOGRAM N/A 05/28/2014   Procedure: LEFT HEART CATHETERIZATION WITH CORONARY ANGIOGRAM;  Surgeon: Kathleene Hazel, MD;   Location: [facility] CATH LAB;  Service: Cardiovascular;  Laterality: N/A;  . PERCUTANEOUS CORONARY STENT INTERVENTION (PCI-S)  05/28/2014   Procedure: PERCUTANEOUS CORONARY STENT INTERVENTION (PCI-S);  Surgeon: Kathleene Hazel, MD;  Location: [facility] CATH LAB;  Service: Cardiovascular;;  . PTCA  12/09/2000   successful; of the proximal left anterior descending with reduction of 99% narrowing to 30% with improvement of TIMI grade 1 to TIMI grade 3 flow  . TONSILLECTOMY AND ADENOIDECTOMY  1943    Current Outpatient Medications  Medication Sig Dispense Refill  . acetaminophen (TYLENOL) 325 MG tablet Take 2 tablets (650 mg total) by mouth every 4 (four) hours as needed for headache or mild pain.    [name] amLODipine (NORVASC) 5 MG tablet Take 0.5 tablets (2.5 mg total) by mouth daily. 45 tablet 2  . atorvastatin (LIPITOR) 20 MG tablet Take 0.5 tablets (10 mg total) by mouth daily. 90 tablet 2  . carvedilol (COREG) 12.5 MG tablet TAKE 1 TABLET BY MOUTH TWICE DAILY WITH A MEAL 60 tablet 11  . Cholecalciferol (CVS VITAMIN D3) 1000 UNITS capsule Take 1 capsule (1,000 Units total) by mouth daily. 100 capsule 3  . clopidogrel (PLAVIX) 75 MG tablet TAKE 1 TABLET BY MOUTH EVERY DAY 30 tablet 10  . mirtazapine (REMERON) 15 MG tablet Take 1 tablet (15 mg  total) by mouth at bedtime. 90 tablet 1  . NAMZARIC 28-10 MG CP24 TAKE 1 CAPSULE BY MOUTH DAILY 30 capsule 5  . nitroGLYCERIN (NITROSTAT) 0.4 MG SL tablet Place 1 tablet (0.4 mg total) under the tongue every 5 (five) minutes as needed for chest pain. 25 tablet 0  . traMADol-acetaminophen (ULTRACET) 37.5-325 MG tablet Take 0.5-1 tablets by mouth 2 (two) times daily as needed. (Patient taking differently: Take 0.5-1 tablets by mouth 2 (two) times daily. 0.5 tablet in the morning and 1 tablet in the evening) 60 tablet 2   No current facility-administered medications for this visit.     Allergies  Allergen Reactions  . Simvastatin Other (See Comments)    REACTION:  memory problem, myalgia    Social History   Socioeconomic History  . Marital status: Widowed    Spouse name: Not on file  . Number of children: Not on file  . Years of education: Not on file  . Highest education level: Not on file  Social Needs  . Financial resource strain: Not on file  . Food insecurity - worry: Not on file  . Food insecurity - inability: Not on file  . Transportation needs - medical: Not on file  . Transportation needs - non-medical: Not on file  Occupational History    Employer: RETIRED    Comment: Retired  Tobacco Use  . Smoking status: Never Smoker  . Smokeless tobacco: Never Used  Substance and Sexual Activity  . Alcohol use: No  . Drug use: No  . Sexual activity: No  Other Topics Concern  . Not on file  Social History Narrative   Regular exercise-yes    Family History  Problem Relation Age of Onset  . Diabetes Mother   . Heart attack Mother   . Sudden death Mother   . Hypertension Other   . Heart disease Other   . Coronary artery disease Other   . Hypertension Daughter     Review of Systems:  As stated in the HPI and otherwise negative.   BP (!) 116/50 (BP Location: Left Arm, Patient Position: Sitting, Cuff Size: Normal)   Pulse 72   Ht 5\' 2"  (1.575 m)   Wt 139 lb (63 kg)   SpO2 98%   BMI 25.42 kg/m   Physical Examination:  General: Well developed, well nourished, NAD  HEENT: OP clear, mucus membranes moist  SKIN: warm, dry. No rashes. Neuro: No focal deficits  Musculoskeletal: Muscle strength 5/5 all ext  Psychiatric: Mood and affect normal  Neck: No JVD, no carotid bruits, no thyromegaly, no lymphadenopathy.  Lungs:Clear bilaterally, no wheezes, rhonci, crackles Cardiovascular: Regular rate and rhythm. No murmurs, gallops or rubs. Abdomen:Soft. Bowel sounds present. Non-tender.  Extremities: No lower extremity edema. Pulses are 2 + in the bilateral DP/PT.  LHC (05/28/14): Distal left main 20%, proximal LAD 30%, mid LAD 99%,  OM 50-60, proximal RCA 30%, mid RCA 80%/95%. >>> PCI: 2.5 x 15 mm Xience DES in the mid LAD and 2.5 x 38 mm Xience DES in distal RCA  Left main: 20% distal stenosis.  Left Anterior Descending Artery: Large caliber vessel that courses to the apex. The proximal vessel has diffuse 30% stenosis. The mid vessel has a focal 99% stenosis. There are several small caliber diagonal branches.  Circumflex Artery: Large caliber vessel that terminates into a large caliber obtuse marginal branch. The OM branch has a focal 50-60% stenosis.  Right Coronary Artery: Large dominant vessel with 30% proximal stenosis,  80% mid stenosis followed by serial 95% stenoses in the distal vessel. The posterolateral branch and PDA are moderate in caliber and patent with mild diffuse disease.   Echo 07/26/14: Left ventricle: LVEF is approximately 55% with mild apical hypokinesis. The cavity size was normal. Wall thickness was normal. Doppler parameters are consistent with abnormal left ventricular relaxation (grade 1 diastolic dysfunction). - Aortic valve: There was mild regurgitation. - Mitral valve: There was mild regurgitation. - Left atrium: The atrium was severely dilated. - Right atrium: The atrium was moderately dilated. - Pulmonic valve: There was moderate regurgitation. - Pulmonary arteries: PA peak pressure: 38 mm Hg (S).  EKG:  EKG is  ordered today. The ekg ordered today demonstrates NSR, rate 72 bpm.    Recent Labs: 05/28/2017: TSH 2.64 12/02/2017: ALT 20; Hemoglobin 13.4; Platelets 182 12/08/2017: BUN 17; Creatinine, Ser 0.84; Potassium 5.6; Sodium 138   Lipid Panel    Component Value Date/Time   CHOL 145 05/28/2017 0903   TRIG 228.0 (H) 05/28/2017 0903   HDL 41.00 05/28/2017 0903   CHOLHDL 4 05/28/2017 0903   VLDL 45.6 (H) 05/28/2017 0903   LDLCALC 60 09/06/2015 1052   LDLDIRECT 61.0 05/28/2017 0903     Wt Readings from Last 3 Encounters:  02/09/18 139 lb (63 kg)  12/29/17 127 lb (57.6 kg)  12/08/17  120 lb (54.4 kg)     Other studies Reviewed: Additional studies/ records that were reviewed today include: . Review of the above records demonstrates:    Assessment and Plan:   1. Coronary artery disease without angina: No chest pain suggestive of angina. Will continue Plavix, statin and beta blocker.       2. Mitral regurgitation: Mild by echo 07/26/14. Will repeat echo now   3. Hyperlipidemia: Her LDL is at goal. Will continue statin.   4. Essential hypertension: BP is controlled. No changes.   Current medicines are reviewed at length with the patient today.  The patient does not have concerns regarding medicines.  The following changes have been made:  no change  Labs/ tests ordered today include:   Orders Placed This Encounter  Procedures  . EKG 12-Lead  . ECHOCARDIOGRAM COMPLETE    Disposition:   FU with me in 12  months  Signed, Verne Carrowhristopher McAlhany, MD 02/09/2018 12:10 PM    [facility] [address], [place], [place]  [address] Phone: [phone]; Fax: [phone]

## 2018-02-17 ENCOUNTER — Other Ambulatory Visit (HOSPITAL_COMMUNITY): Payer: Medicare Other

## 2018-02-23 ENCOUNTER — Ambulatory Visit (HOSPITAL_COMMUNITY): Payer: Medicare Other | Attending: Cardiology

## 2018-02-23 ENCOUNTER — Other Ambulatory Visit: Payer: Self-pay

## 2018-02-23 DIAGNOSIS — I2510 Atherosclerotic heart disease of native coronary artery without angina pectoris: Secondary | ICD-10-CM | POA: Diagnosis not present

## 2018-02-23 DIAGNOSIS — I081 Rheumatic disorders of both mitral and tricuspid valves: Secondary | ICD-10-CM | POA: Insufficient documentation

## 2018-02-23 DIAGNOSIS — I340 Nonrheumatic mitral (valve) insufficiency: Secondary | ICD-10-CM

## 2018-04-11 ENCOUNTER — Ambulatory Visit: Payer: Medicare Other | Admitting: Internal Medicine

## 2018-04-11 DIAGNOSIS — Z0289 Encounter for other administrative examinations: Secondary | ICD-10-CM

## 2018-04-20 DIAGNOSIS — H04123 Dry eye syndrome of bilateral lacrimal glands: Secondary | ICD-10-CM | POA: Diagnosis not present

## 2018-04-20 DIAGNOSIS — Z961 Presence of intraocular lens: Secondary | ICD-10-CM | POA: Diagnosis not present

## 2018-04-20 DIAGNOSIS — H353131 Nonexudative age-related macular degeneration, bilateral, early dry stage: Secondary | ICD-10-CM | POA: Diagnosis not present

## 2018-04-20 DIAGNOSIS — H35033 Hypertensive retinopathy, bilateral: Secondary | ICD-10-CM | POA: Diagnosis not present

## 2018-05-19 ENCOUNTER — Ambulatory Visit: Payer: Medicare Other | Admitting: Internal Medicine

## 2018-06-01 ENCOUNTER — Ambulatory Visit (INDEPENDENT_AMBULATORY_CARE_PROVIDER_SITE_OTHER): Payer: Medicare Other | Admitting: Internal Medicine

## 2018-06-01 ENCOUNTER — Encounter: Payer: Self-pay | Admitting: Internal Medicine

## 2018-06-01 ENCOUNTER — Other Ambulatory Visit (INDEPENDENT_AMBULATORY_CARE_PROVIDER_SITE_OTHER): Payer: Medicare Other

## 2018-06-01 VITALS — BP 128/84 | HR 82 | Ht 62.0 in | Wt 154.0 lb

## 2018-06-01 DIAGNOSIS — R413 Other amnesia: Secondary | ICD-10-CM

## 2018-06-01 DIAGNOSIS — E785 Hyperlipidemia, unspecified: Secondary | ICD-10-CM

## 2018-06-01 DIAGNOSIS — R739 Hyperglycemia, unspecified: Secondary | ICD-10-CM

## 2018-06-01 LAB — BASIC METABOLIC PANEL
BUN: 22 mg/dL (ref 6–23)
CALCIUM: 9.3 mg/dL (ref 8.4–10.5)
CO2: 30 meq/L (ref 19–32)
Chloride: 105 mEq/L (ref 96–112)
Creatinine, Ser: 1.09 mg/dL (ref 0.40–1.20)
GFR: 50.26 mL/min — AB (ref >60.00)
GLUCOSE: 99 mg/dL (ref 70–99)
Potassium: 4 mEq/L (ref 3.5–5.1)
SODIUM: 142 meq/L (ref 135–145)

## 2018-06-01 LAB — HEMOGLOBIN A1C: Hgb A1c MFr Bld: 6.2 % (ref 4.6–6.5)

## 2018-06-01 MED ORDER — MIRTAZAPINE 15 MG PO TABS: 15.0000 mg | ORAL_TABLET | Freq: Every day | ORAL | 1 refills | Status: AC

## 2018-06-01 MED ORDER — MEMANTINE HCL-DONEPEZIL HCL ER 28-10 MG PO CP24: 1.0000 | ORAL_CAPSULE | Freq: Every day | ORAL | 1 refills | Status: AC

## 2018-06-01 MED ORDER — CLOPIDOGREL BISULFATE 75 MG PO TABS: 75.0000 mg | ORAL_TABLET | Freq: Every day | ORAL | 3 refills | Status: AC

## 2018-06-01 MED ORDER — CARVEDILOL 12.5 MG PO TABS: ORAL_TABLET | ORAL | 3 refills | Status: AC

## 2018-06-01 MED ORDER — ATORVASTATIN CALCIUM 20 MG PO TABS: 10.0000 mg | ORAL_TABLET | Freq: Every day | ORAL | 3 refills | Status: AC

## 2018-06-01 MED ORDER — AMLODIPINE BESYLATE 5 MG PO TABS: 2.5000 mg | ORAL_TABLET | Freq: Every day | ORAL | 3 refills | Status: AC

## 2018-06-01 NOTE — Assessment & Plan Note (Signed)
[facility]

## 2018-06-01 NOTE — Progress Notes (Signed)
Subjective:  Patient ID: [name], female    DOB: 1927/01/28  Age: 84 y.o. MRN: [id number]  CC: No chief complaint on file.   HPI [name] presents for Alzheimer, HTN, CAD f/u  Outpatient Medications Prior to Visit  Medication Sig Dispense Refill  . acetaminophen (TYLENOL) 325 MG tablet Take 2 tablets (650 mg total) by mouth every 4 (four) hours as needed for headache or mild pain.    [name]. amLODipine (NORVASC) 5 MG tablet Take 0.5 tablets (2.5 mg total) by mouth daily. 45 tablet 2  . atorvastatin (LIPITOR) 20 MG tablet Take 0.5 tablets (10 mg total) by mouth daily. 90 tablet 2  . carvedilol (COREG) 12.5 MG tablet TAKE 1 TABLET BY MOUTH TWICE DAILY WITH A MEAL 60 tablet 11  . Cholecalciferol (CVS VITAMIN D3) 1000 UNITS capsule Take 1 capsule (1,000 Units total) by mouth daily. 100 capsule 3  . clopidogrel (PLAVIX) 75 MG tablet TAKE 1 TABLET BY MOUTH EVERY DAY 30 tablet 10  . mirtazapine (REMERON) 15 MG tablet Take 1 tablet (15 mg total) by mouth at bedtime. 90 tablet 1  . NAMZARIC 28-10 MG CP24 TAKE 1 CAPSULE BY MOUTH DAILY 30 capsule 5  . nitroGLYCERIN (NITROSTAT) 0.4 MG SL tablet Place 1 tablet (0.4 mg total) under the tongue every 5 (five) minutes as needed for chest pain. 25 tablet 0  . traMADol-acetaminophen (ULTRACET) 37.5-325 MG tablet Take 0.5-1 tablets by mouth 2 (two) times daily as needed. (Patient taking differently: Take 0.5-1 tablets by mouth 2 (two) times daily. 0.5 tablet in the morning and 1 tablet in the evening) 60 tablet 2   No facility-administered medications prior to visit.     ROS: Review of Systems  Constitutional: Negative for activity change, appetite change, chills, fatigue and unexpected weight change.  HENT: Negative for congestion, mouth sores and sinus pressure.   Eyes: Negative for visual disturbance.  Respiratory: Negative for cough and chest tightness.   Gastrointestinal: Negative for abdominal pain and nausea.  Genitourinary: Negative for  difficulty urinating, frequency and vaginal pain.  Musculoskeletal: Negative for back pain and gait problem.  Skin: Negative for pallor and rash.  Neurological: Negative for dizziness, tremors, weakness, numbness and headaches.  Psychiatric/Behavioral: Positive for decreased concentration. Negative for confusion, sleep disturbance and suicidal ideas.    Objective:  BP 128/84 (BP Location: Left Arm, Patient Position: Sitting, Cuff Size: Normal)   Pulse 82   Ht 5\' 2"  (1.575 m)   Wt 154 lb (69.9 kg)   SpO2 96%   BMI 28.17 kg/m   BP Readings from Last 3 Encounters:  06/01/18 128/84  02/09/18 (!) 116/50  12/29/17 (!) 122/58    Wt Readings from Last 3 Encounters:  06/01/18 154 lb (69.9 kg)  02/09/18 139 lb (63 kg)  12/29/17 127 lb (57.6 kg)    Physical Exam  Constitutional: She appears well-developed. No distress.  HENT:  Head: Normocephalic.  Right Ear: External ear normal.  Left Ear: External ear normal.  Nose: Nose normal.  Mouth/Throat: Oropharynx is clear and moist.  Eyes: Pupils are equal, round, and reactive to light. Conjunctivae are normal. Right eye exhibits no discharge. Left eye exhibits no discharge.  Neck: Normal range of motion. Neck supple. No JVD present. No tracheal deviation present. No thyromegaly present.  Cardiovascular: Normal rate, regular rhythm and normal heart sounds.  Pulmonary/Chest: No stridor. No respiratory distress. She has no wheezes.  Abdominal: Soft. Bowel sounds are normal. She exhibits no distension  and no mass. There is no tenderness. There is no rebound and no guarding.  Musculoskeletal: She exhibits no edema or tenderness.  Lymphadenopathy:    She has no cervical adenopathy.  Neurological: She displays normal reflexes. No cranial nerve deficit. She exhibits normal muscle tone. Coordination normal.  Skin: No rash noted. No erythema.  Psychiatric: She has a normal mood and affect. Her behavior is normal. Judgment and thought content  normal.    Lab Results  Component Value Date   WBC 6.8 12/02/2017   HGB 13.4 12/02/2017   HCT 42.5 12/02/2017   PLT 182 12/02/2017   GLUCOSE 105 (H) 12/08/2017   CHOL 145 05/28/2017   TRIG 228.0 (H) 05/28/2017   HDL 41.00 05/28/2017   LDLDIRECT 61.0 05/28/2017   LDLCALC 60 09/06/2015   ALT 20 12/02/2017   AST 27 12/02/2017   NA 138 12/08/2017   K 5.6 (H) 12/08/2017   CL 102 12/08/2017   CREATININE 0.84 12/08/2017   BUN 17 12/08/2017   CO2 29 12/08/2017   TSH 2.64 05/28/2017   INR 0.97 03/07/2015    Ct Head Wo Contrast  Result Date: 12/02/2017 CLINICAL DATA:  Dementia, patient found down on kitchen floor last evening. EXAM: CT HEAD WITHOUT CONTRAST TECHNIQUE: Contiguous axial images were obtained from the base of the skull through the vertex without intravenous contrast. COMPARISON:  01/20/2010 FINDINGS: Brain: Chronic small vessel ischemic disease and atrophy. Chronic bilateral basal ganglial lacunar infarcts. No acute intracranial hemorrhage, midline shift or edema. No intra-axial mass nor extra-axial fluid collections. No hydrocephalus. No effacement of the basal cisterns or fourth ventricle. Vascular: Moderate atherosclerosis of the carotid siphons. Skull: Negative for fracture or focal lesion. Sinuses/Orbits: No acute finding. Other: None. IMPRESSION: 1. Chronic stable small vessel ischemic disease of periventricular white matter with chronic bilateral basal ganglial lacunar infarcts. 2. No acute intracranial abnormality. Electronically Signed   By: Tollie Eth M.D.   On: 12/02/2017 20:26   Dg Knee Complete 4 Views Right  Result Date: 12/02/2017 CLINICAL DATA:  Recent fall with right knee pain, initial encounter EXAM: RIGHT KNEE - COMPLETE 4+ VIEW COMPARISON:  None. FINDINGS: No evidence of fracture, dislocation, or joint effusion. No evidence of arthropathy or other focal bone abnormality. Soft tissues are unremarkable. IMPRESSION: No acute abnormality noted. Electronically Signed    By: Alcide Clever M.D.   On: 12/02/2017 21:01    Assessment & Plan:   There are no diagnoses linked to this encounter.   No orders of the defined types were placed in this encounter.    Follow-up: No follow-ups on file.  Sonda Primes, MD

## 2018-07-21 ENCOUNTER — Encounter: Payer: Self-pay | Admitting: Internal Medicine

## 2018-07-21 ENCOUNTER — Ambulatory Visit (INDEPENDENT_AMBULATORY_CARE_PROVIDER_SITE_OTHER): Payer: Medicare Other | Admitting: Internal Medicine

## 2018-07-21 DIAGNOSIS — N3090 Cystitis, unspecified without hematuria: Secondary | ICD-10-CM | POA: Diagnosis not present

## 2018-07-21 MED ORDER — SULFAMETHOXAZOLE-TRIMETHOPRIM 800-160 MG PO TABS: 1.0000 | ORAL_TABLET | Freq: Two times a day (BID) | ORAL | 0 refills | Status: DC

## 2018-07-21 NOTE — Patient Instructions (Signed)
We have sent in bactrim which is for the bladder infection. Take 1 pill twice a day for 5 days.

## 2018-07-21 NOTE — Progress Notes (Signed)
   Subjective:    Patient ID: [name], female    DOB: Oct 14, 1927, 84 y.o.   MRN: [id number]  HPI The patient is an 84 YO female coming in for possible UTI. She does have dementia and cannot tell her own history. Her daughter relates that her carriage house has reported foul smelling urine. She denies pain with urination or abdominal pain. She does have some back pain which is new for her. Denies fevers or chills. Taking meds appropriately. She is incontinent and does not know when she urinates. She could not urinate today.  Review of Systems  Unable to perform ROS: Dementia  Constitutional: Negative.   Respiratory: Negative.   Cardiovascular: Negative.   Gastrointestinal: Positive for abdominal pain. Negative for abdominal distention, constipation, diarrhea, nausea and vomiting.  Genitourinary: Negative for dysuria, frequency and urgency.       Foul smelling urine  Musculoskeletal: Negative.   Skin: Negative.       Objective:   Physical Exam  Constitutional: She appears well-developed and well-nourished.  HENT:  Head: Normocephalic and atraumatic.  Eyes: EOM are normal.  Neck: Normal range of motion.  Cardiovascular: Normal rate and regular rhythm.  Pulmonary/Chest: Effort normal and breath sounds normal. No respiratory distress. She has no wheezes. She has no rales.  Abdominal: Soft. Bowel sounds are normal. She exhibits no distension. There is no tenderness. There is no rebound.  Musculoskeletal: She exhibits no edema.  Neurological: She is alert. Coordination normal.  Skin: Skin is warm and dry.   Vitals:   07/21/18 1501  BP: 134/80  Pulse: 68  Temp: 97.8 F (36.6 C)  TempSrc: Oral  SpO2: 97%  Weight: 157 lb (71.2 kg)  Height: 5\' 2"  (1.575 m)      Assessment & Plan:

## 2018-07-22 ENCOUNTER — Ambulatory Visit: Payer: Medicare Other | Admitting: Internal Medicine

## 2018-07-22 NOTE — Assessment & Plan Note (Signed)
Has had previously and likely does have again. Rx for bactrim 5 day course and if no change needs U/A with culture for assessment.

## 2018-08-29 ENCOUNTER — Encounter: Payer: Self-pay | Admitting: Nurse Practitioner

## 2018-08-29 ENCOUNTER — Ambulatory Visit (INDEPENDENT_AMBULATORY_CARE_PROVIDER_SITE_OTHER): Payer: Medicare Other | Admitting: Nurse Practitioner

## 2018-08-29 VITALS — BP 160/78 | HR 72 | Temp 97.5°F | Ht 62.0 in | Wt 160.0 lb

## 2018-08-29 DIAGNOSIS — R05 Cough: Secondary | ICD-10-CM | POA: Diagnosis not present

## 2018-08-29 DIAGNOSIS — R059 Cough, unspecified: Secondary | ICD-10-CM

## 2018-08-29 MED ORDER — AZITHROMYCIN 250 MG PO TABS
ORAL_TABLET | ORAL | 0 refills | Status: DC
Start: 2018-08-29 — End: 2018-12-06

## 2018-08-29 NOTE — Patient Instructions (Signed)
Please take z-pak as prescribed  Please follow up for fevers over 101, if your symptoms get worse, or if your symptoms dont get better with the antibiotic.   Cough, Adult A cough helps to clear your throat and lungs. A cough may last only 2-3 weeks (acute), or it may last longer than 8 weeks (chronic). Many different things can cause a cough. A cough may be a sign of an illness or another medical condition. Follow these instructions at home:  Pay attention to any changes in your cough.  Take medicines only as told by your doctor. ? If you were prescribed an antibiotic medicine, take it as told by your doctor. Do not stop taking it even if you start to feel better. ? Talk with your doctor before you try using a cough medicine.  Drink enough fluid to keep your pee (urine) clear or pale yellow.  If the air is dry, use a cold steam vaporizer or humidifier in your home.  Stay away from things that make you cough at work or at home.  If your cough is worse at night, try using extra pillows to raise your head up higher while you sleep.  Do not smoke, and try not to be around smoke. If you need help quitting, ask your doctor.  Do not have caffeine.  Do not drink alcohol.  Rest as needed. Contact a doctor if:  You have new problems (symptoms).  You cough up yellow fluid (pus).  Your cough does not get better after 2-3 weeks, or your cough gets worse.  Medicine does not help your cough and you are not sleeping well.  You have pain that gets worse or pain that is not helped with medicine.  You have a fever.  You are losing weight and you do not know why.  You have night sweats. Get help right away if:  You cough up blood.  You have trouble breathing.  Your heartbeat is very fast. This information is not intended to replace advice given to you by your health care provider. Make sure you discuss any questions you have with your health care provider. Document Released:  07/30/2011 Document Revised: 04/23/2016 Document Reviewed: 01/23/2015 Elsevier Interactive Patient Education  [employer].

## 2018-08-29 NOTE — Progress Notes (Signed)
Name: [name]   MRN: [id number]    DOB: [birth year]   Date:08/29/2018       Progress Note  Subjective  Chief Complaint  Chief Complaint  Patient presents with  . Cough    x 4 days    HPI  [name] is here today for evaluation of cough, which seemed to begin around this past Thursday, seems to be getting worse since onset. She has history of memory problems, lives in ALF, her daughter brought her in today at request of staff for worsening cough. Aside from nasal and sinus congestion, she has no other complaints today. She denies fevers, chills, headaches, weakness, chest pain, shortness of breath, abdominal pain, nausea, vomiting. Has not tried anything at home for her symptoms.   Patient Active Problem List   Diagnosis Date Noted  . Low back pain 03/13/2015  . NSTEMI (non-ST elevated myocardial infarction) (HCC) 05/26/2014  . Acute URI 03/06/2014  . Pneumonia involving right lung 03/06/2014  . Anogenital lichen sclerosus 11/01/2013  . Well adult exam 05/11/2013  . Chills 04/04/2013  . Chronic low back pain 05/30/2012  . Grief reaction 07/15/2011  . Fatigue 04/21/2011  . Dizzy spells 04/21/2011  . Sinusitis, chronic 04/21/2011  . ALLERGIC RHINITIS 02/16/2011  . Alzheimer's dementia 11/01/2010  . Anxiety state 10/28/2010  . ACTINIC KERATOSIS 10/28/2010  . Cystitis 08/22/2009  . HERPES ZOSTER 05/23/2009  . Situational depression 12/05/2008  . VERTIGO 04/16/2008  . Memory loss 04/16/2008  . Dyslipidemia 10/25/2007  . Essential hypertension 10/25/2007  . DIVERTICULOSIS, COLON 10/25/2007  . Coronary atherosclerosis 09/26/2007  . OSTEOPOROSIS 09/26/2007    Social History   Tobacco Use  . Smoking status: Never Smoker  . Smokeless tobacco: Never Used  Substance Use Topics  . Alcohol use: No     Current Outpatient Medications:  .  acetaminophen (TYLENOL) 325 MG tablet, Take 2 tablets (650 mg total) by mouth every 4 (four) hours as needed for headache or mild  pain., Disp: , Rfl:  .  amLODipine (NORVASC) 5 MG tablet, Take 0.5 tablets (2.5 mg total) by mouth daily., Disp: 45 tablet, Rfl: 3 .  atorvastatin (LIPITOR) 20 MG tablet, Take 0.5 tablets (10 mg total) by mouth daily., Disp: 45 tablet, Rfl: 3 .  carvedilol (COREG) 12.5 MG tablet, TAKE 1 TABLET BY MOUTH TWICE DAILY WITH A MEAL, Disp: 180 tablet, Rfl: 3 .  Cholecalciferol ([employer] VITAMIN D3) 1000 UNITS capsule, Take 1 capsule (1,000 Units total) by mouth daily., Disp: 100 capsule, Rfl: 3 .  clopidogrel (PLAVIX) 75 MG tablet, Take 1 tablet (75 mg total) by mouth daily., Disp: 90 tablet, Rfl: 3 .  Memantine HCl-Donepezil HCl (NAMZARIC) 28-10 MG CP24, Take 1 capsule by mouth daily., Disp: 90 capsule, Rfl: 1 .  mirtazapine (REMERON) 15 MG tablet, Take 1 tablet (15 mg total) by mouth at bedtime., Disp: 90 tablet, Rfl: 1 .  nitroGLYCERIN (NITROSTAT) 0.4 MG SL tablet, Place 1 tablet (0.4 mg total) under the tongue every 5 (five) minutes as needed for chest pain., Disp: 25 tablet, Rfl: 0 .  traMADol-acetaminophen (ULTRACET) 37.5-325 MG tablet, Take 0.5-1 tablets by mouth 2 (two) times daily as needed. (Patient taking differently: Take 0.5-1 tablets by mouth 2 (two) times daily. 0.5 tablet in the morning and 1 tablet in the evening), Disp: 60 tablet, Rfl: 2  Allergies  Allergen Reactions  . Simvastatin Other (See Comments)    REACTION: memory problem, myalgia    ROS  No other specific  complaints in a complete review of systems (except as listed in HPI above).  Objective  Vitals:   08/29/18 1433  BP: (!) 160/78  Pulse: 72  Temp: (!) 97.5 F (36.4 C)  TempSrc: Oral  SpO2: 97%  Weight: 160 lb (72.6 kg)  Height: 5\' 2"  (1.575 m)    Body mass index is 29.26 kg/m.  Nursing Note and Vital Signs reviewed.  Physical Exam  Constitutional: Patient appears well-developed and well-nourished.  No distress.  HEENT: head atraumatic, normocephalic, pupils equal and reactive to light, EOM's intact, TM's  without erythema or bulging, scant cerumen to bilateral external ear canals, no maxillary or frontal sinus tenderness , neck supple without lymphadenopathy, oropharynx erythematous and moist without exudate Cardiovascular: Normal rate, regular rhythm, distal pulses intact Pulmonary/Chest: Effort normal and breath sounds clear. No respiratory distress or retractions. Neurological: She is alert and oriented to person, place, and time. No cranial nerve deficit. Coordination, balance, strength, speech and gait are normal.  Skin: Skin is warm and dry. No rash noted. No erythema.  Psychiatric: Patient has a normal mood and affect. Memory is impaired   Assessment & Plan  1. Cough Although her symptoms reportedly began 4 days ago, Due to age, memory difficulty, living in ALF, will go ahead and treat with azithro course-dosing and side effects discussed HOme management, Red flags and when to present for emergency care or RTC including fever >101.85F, chest pain, shortness of breath, new/worsening/un-resolving symptoms,  reviewed with patient and provided in AVS. - azithromycin (ZITHROMAX) 250 MG tablet; Take 2 tablets today then 1 tablet daily until complete  Dispense: 6 tablet; Refill: 0

## 2018-09-12 ENCOUNTER — Ambulatory Visit (INDEPENDENT_AMBULATORY_CARE_PROVIDER_SITE_OTHER): Payer: Medicare Other

## 2018-09-12 DIAGNOSIS — Z23 Encounter for immunization: Secondary | ICD-10-CM | POA: Diagnosis not present

## 2018-12-06 ENCOUNTER — Other Ambulatory Visit (INDEPENDENT_AMBULATORY_CARE_PROVIDER_SITE_OTHER): Payer: Medicare Other

## 2018-12-06 ENCOUNTER — Encounter: Payer: Self-pay | Admitting: Internal Medicine

## 2018-12-06 ENCOUNTER — Ambulatory Visit (INDEPENDENT_AMBULATORY_CARE_PROVIDER_SITE_OTHER)
Admission: RE | Admit: 2018-12-06 | Discharge: 2018-12-06 | Disposition: A | Discharge status: Home / Self Care | Payer: Medicare Other | Source: Ambulatory Visit | Attending: Internal Medicine | Admitting: Internal Medicine

## 2018-12-06 ENCOUNTER — Ambulatory Visit (INDEPENDENT_AMBULATORY_CARE_PROVIDER_SITE_OTHER): Payer: Medicare Other | Admitting: Internal Medicine

## 2018-12-06 VITALS — BP 134/76 | HR 75 | Temp 98.1°F | Ht 62.0 in | Wt 164.0 lb

## 2018-12-06 DIAGNOSIS — R05 Cough: Secondary | ICD-10-CM | POA: Diagnosis not present

## 2018-12-06 DIAGNOSIS — R059 Cough, unspecified: Secondary | ICD-10-CM

## 2018-12-06 DIAGNOSIS — M5440 Lumbago with sciatica, unspecified side: Secondary | ICD-10-CM

## 2018-12-06 DIAGNOSIS — R5383 Other fatigue: Secondary | ICD-10-CM | POA: Diagnosis not present

## 2018-12-06 DIAGNOSIS — J90 Pleural effusion, not elsewhere classified: Secondary | ICD-10-CM | POA: Diagnosis not present

## 2018-12-06 DIAGNOSIS — G8929 Other chronic pain: Secondary | ICD-10-CM

## 2018-12-06 DIAGNOSIS — J9811 Atelectasis: Secondary | ICD-10-CM | POA: Diagnosis not present

## 2018-12-06 LAB — URINALYSIS, ROUTINE W REFLEX MICROSCOPIC
Bilirubin Urine: NEGATIVE
Ketones, ur: NEGATIVE
Leukocytes, UA: NEGATIVE
Nitrite: NEGATIVE
Specific Gravity, Urine: 1.025 (ref 1.000–1.030)
Total Protein, Urine: 30 — AB
URINE GLUCOSE: NEGATIVE
UROBILINOGEN UA: 1 (ref 0.0–1.0)
pH: 6 (ref 5.0–8.0)

## 2018-12-06 LAB — HEPATIC FUNCTION PANEL
ALT: 17 U/L (ref 0–35)
AST: 23 U/L (ref 0–37)
Albumin: 3.6 g/dL (ref 3.5–5.2)
Alkaline Phosphatase: 64 U/L (ref 39–117)
Bilirubin, Direct: 0.1 mg/dL (ref 0.0–0.3)
Total Bilirubin: 0.5 mg/dL (ref 0.2–1.2)
Total Protein: 7.2 g/dL (ref 6.0–8.3)

## 2018-12-06 LAB — BASIC METABOLIC PANEL
BUN: 19 mg/dL (ref 6–23)
CO2: 28 mEq/L (ref 19–32)
Calcium: 9 mg/dL (ref 8.4–10.5)
Chloride: 104 mEq/L (ref 96–112)
Creatinine, Ser: 1.23 mg/dL — ABNORMAL HIGH (ref 0.40–1.20)
GFR: 43.67 mL/min — ABNORMAL LOW (ref >60.00)
Glucose, Bld: 102 mg/dL — ABNORMAL HIGH (ref 70–99)
Potassium: 3.4 mEq/L — ABNORMAL LOW (ref 3.5–5.1)
Sodium: 141 mEq/L (ref 135–145)

## 2018-12-06 LAB — TSH: TSH: 3.8 u[IU]/mL (ref 0.35–4.50)

## 2018-12-06 NOTE — Progress Notes (Signed)
Subjective:  Patient ID: Burt Knackuby M Standing, female    DOB: 1927/05/07  Age: [age] y.o. MRN: [id number]  CC: No chief complaint on file.   HPI Mora Bellmanuby Clancy GourdM Donnelly presents for LBP - worse, dementia, dyslipidemia f/u  Outpatient Medications Prior to Visit  Medication Sig Dispense Refill  . acetaminophen (TYLENOL) 325 MG tablet Take 2 tablets (650 mg total) by mouth every 4 (four) hours as needed for headache or mild pain.    [name]. amLODipine (NORVASC) 5 MG tablet Take 0.5 tablets (2.5 mg total) by mouth daily. 45 tablet 3  . atorvastatin (LIPITOR) 20 MG tablet Take 0.5 tablets (10 mg total) by mouth daily. 45 tablet 3  . carvedilol (COREG) 12.5 MG tablet TAKE 1 TABLET BY MOUTH TWICE DAILY WITH A MEAL 180 tablet 3  . Cholecalciferol ([employer]S VITAMIN D3) 1000 UNITS capsule Take 1 capsule (1,000 Units total) by mouth daily. 100 capsule 3  . clopidogrel (PLAVIX) 75 MG tablet Take 1 tablet (75 mg total) by mouth daily. 90 tablet 3  . Memantine HCl-Donepezil HCl (NAMZARIC) 28-10 MG CP24 Take 1 capsule by mouth daily. 90 capsule 1  . mirtazapine (REMERON) 15 MG tablet Take 1 tablet (15 mg total) by mouth at bedtime. 90 tablet 1  . nitroGLYCERIN (NITROSTAT) 0.4 MG SL tablet Place 1 tablet (0.4 mg total) under the tongue every 5 (five) minutes as needed for chest pain. 25 tablet 0  . traMADol-acetaminophen (ULTRACET) 37.5-325 MG tablet Take 0.5-1 tablets by mouth 2 (two) times daily as needed. (Patient taking differently: Take 0.5-1 tablets by mouth 2 (two) times daily. 0.5 tablet in the morning and 1 tablet in the evening) 60 tablet 2  . azithromycin (ZITHROMAX) 250 MG tablet Take 2 tablets today then 1 tablet daily until complete 6 tablet 0   No facility-administered medications prior to visit.     ROS: Review of Systems  Constitutional: Negative for activity change, appetite change, chills, fatigue and unexpected weight change.  HENT: Negative for congestion, mouth sores and sinus pressure.   Eyes: Negative for  visual disturbance.  Respiratory: Positive for cough. Negative for chest tightness.   Gastrointestinal: Negative for abdominal pain and nausea.  Genitourinary: Negative for difficulty urinating, frequency and vaginal pain.  Musculoskeletal: Positive for back pain. Negative for gait problem.  Skin: Negative for pallor and rash.  Neurological: Positive for weakness. Negative for dizziness, tremors, numbness and headaches.  Psychiatric/Behavioral: Positive for confusion. Negative for sleep disturbance and suicidal ideas.    Objective:  BP 134/76 (BP Location: Left Arm, Patient Position: Sitting, Cuff Size: Normal)   Pulse 75   Temp 98.1 F (36.7 C) (Oral)   Ht 5\' 2"  (1.575 m)   Wt 164 lb (74.4 kg)   SpO2 92%   BMI 30.00 kg/m   BP Readings from Last 3 Encounters:  12/06/18 134/76  08/29/18 (!) 160/78  07/21/18 134/80    Wt Readings from Last 3 Encounters:  12/06/18 164 lb (74.4 kg)  08/29/18 160 lb (72.6 kg)  07/21/18 157 lb (71.2 kg)    Physical Exam Constitutional:      General: She is not in acute distress.    Appearance: She is well-developed.  HENT:     Head: Normocephalic.     Right Ear: External ear normal.     Left Ear: External ear normal.     Nose: Nose normal.  Eyes:     General:        Right eye: No discharge.  Left eye: No discharge.     Conjunctiva/sclera: Conjunctivae normal.     Pupils: Pupils are equal, round, and reactive to light.  Neck:     Musculoskeletal: Normal range of motion and neck supple.     Thyroid: No thyromegaly.     Vascular: No JVD.     Trachea: No tracheal deviation.  Cardiovascular:     Rate and Rhythm: Normal rate and regular rhythm.     Heart sounds: Normal heart sounds.  Pulmonary:     Effort: No respiratory distress.     Breath sounds: No stridor. No wheezing.  Abdominal:     General: Bowel sounds are normal. There is no distension.     Palpations: Abdomen is soft. There is no mass.     Tenderness: There is no  abdominal tenderness. There is no guarding or rebound.  Musculoskeletal:        General: No tenderness.  Lymphadenopathy:     Cervical: No cervical adenopathy.  Skin:    Findings: No erythema or rash.  Neurological:     Mental Status: She is disoriented.     Cranial Nerves: No cranial nerve deficit.     Motor: Weakness present. No abnormal muscle tone.     Coordination: Coordination abnormal.     Gait: Gait abnormal.     Deep Tendon Reflexes: Reflexes normal.   confiused Coughing LS spine is tender Obese  Lab Results  Component Value Date   WBC 6.8 12/02/2017   HGB 13.4 12/02/2017   HCT 42.5 12/02/2017   PLT 182 12/02/2017   GLUCOSE 99 06/01/2018   CHOL 145 05/28/2017   TRIG 228.0 (H) 05/28/2017   HDL 41.00 05/28/2017   LDLDIRECT 61.0 05/28/2017   LDLCALC 60 09/06/2015   ALT 20 12/02/2017   AST 27 12/02/2017   NA 142 06/01/2018   K 4.0 06/01/2018   CL 105 06/01/2018   CREATININE 1.09 06/01/2018   BUN 22 06/01/2018   CO2 30 06/01/2018   TSH 2.64 05/28/2017   INR 0.97 03/07/2015   HGBA1C 6.2 06/01/2018    Ct Head Wo Contrast  Result Date: 12/02/2017 CLINICAL DATA:  Dementia, patient found down on kitchen floor last evening. EXAM: CT HEAD WITHOUT CONTRAST TECHNIQUE: Contiguous axial images were obtained from the base of the skull through the vertex without intravenous contrast. COMPARISON:  01/20/2010 FINDINGS: Brain: Chronic small vessel ischemic disease and atrophy. Chronic bilateral basal ganglial lacunar infarcts. No acute intracranial hemorrhage, midline shift or edema. No intra-axial mass nor extra-axial fluid collections. No hydrocephalus. No effacement of the basal cisterns or fourth ventricle. Vascular: Moderate atherosclerosis of the carotid siphons. Skull: Negative for fracture or focal lesion. Sinuses/Orbits: No acute finding. Other: None. IMPRESSION: 1. Chronic stable small vessel ischemic disease of periventricular white matter with chronic bilateral basal  ganglial lacunar infarcts. 2. No acute intracranial abnormality. Electronically Signed   By: Tollie Eth M.D.   On: 12/02/2017 20:26   Dg Knee Complete 4 Views Right  Result Date: 12/02/2017 CLINICAL DATA:  Recent fall with right knee pain, initial encounter EXAM: RIGHT KNEE - COMPLETE 4+ VIEW COMPARISON:  None. FINDINGS: No evidence of fracture, dislocation, or joint effusion. No evidence of arthropathy or other focal bone abnormality. Soft tissues are unremarkable. IMPRESSION: No acute abnormality noted. Electronically Signed   By: Alcide Clever M.D.   On: 12/02/2017 21:01    Assessment & Plan:   There are no diagnoses linked to this encounter.   No orders of  the defined types were placed in this encounter.    Follow-up: No follow-ups on file.  Walker Kehr, MD

## 2018-12-06 NOTE — Assessment & Plan Note (Signed)
Worse Tylenol bid LS xray w/DJD 2018

## 2018-12-06 NOTE — Assessment & Plan Note (Signed)
CXR

## 2018-12-07 ENCOUNTER — Other Ambulatory Visit: Payer: Self-pay | Admitting: Internal Medicine

## 2018-12-07 MED ORDER — POTASSIUM CHLORIDE ER 8 MEQ PO TBCR: 8.0000 meq | EXTENDED_RELEASE_TABLET | Freq: Every day | ORAL | 0 refills | Status: AC

## 2018-12-08 ENCOUNTER — Other Ambulatory Visit: Payer: Self-pay | Admitting: Internal Medicine

## 2019-01-10 ENCOUNTER — Ambulatory Visit: Payer: Medicare Other | Admitting: Podiatry

## 2019-01-10 ENCOUNTER — Encounter: Payer: Self-pay | Admitting: Podiatry

## 2019-01-10 VITALS — BP 165/94 | HR 82

## 2019-01-10 DIAGNOSIS — M79674 Pain in right toe(s): Secondary | ICD-10-CM | POA: Diagnosis not present

## 2019-01-10 DIAGNOSIS — R319 Hematuria, unspecified: Secondary | ICD-10-CM | POA: Diagnosis not present

## 2019-01-10 DIAGNOSIS — M79675 Pain in left toe(s): Secondary | ICD-10-CM | POA: Diagnosis not present

## 2019-01-10 DIAGNOSIS — G308 Other Alzheimer's disease: Secondary | ICD-10-CM | POA: Diagnosis not present

## 2019-01-10 DIAGNOSIS — Z79899 Other long term (current) drug therapy: Secondary | ICD-10-CM | POA: Diagnosis not present

## 2019-01-10 DIAGNOSIS — D689 Coagulation defect, unspecified: Secondary | ICD-10-CM

## 2019-01-10 DIAGNOSIS — B351 Tinea unguium: Secondary | ICD-10-CM | POA: Diagnosis not present

## 2019-01-10 DIAGNOSIS — N390 Urinary tract infection, site not specified: Secondary | ICD-10-CM | POA: Diagnosis not present

## 2019-01-10 NOTE — Progress Notes (Signed)
Complaint:  Visit Type: Patient presents  to my office for  preventative foot care services. Complaint: Patient states" my nails have grown long and thick and become painful to walk and wear shoes" Patient returns to the office per recommendation of her facility.  She presents to the office with history of taking plavix. The patient presents for preventative foot care services. No changes to ROS  Podiatric Exam: Vascular: dorsalis pedis and posterior tibial pulses are palpable bilateral. Capillary return is immediate. Temperature gradient is WNL. Skin turgor WNL  Sensorium: Normal Semmes Weinstein monofilament test. Normal tactile sensation bilaterally. Nail Exam: Pt has thick disfigured discolored nails with subungual debris noted bilateral entire nail hallux through fifth toenails Ulcer Exam: There is no evidence of ulcer or pre-ulcerative changes or infection. Orthopedic Exam: Muscle tone and strength are WNL. No limitations in general ROM. No crepitus or effusions noted. Foot type and digits show no abnormalities. Bony prominences are unremarkable. Skin: No Porokeratosis. No infection or ulcers  Diagnosis:  Onychomycosis, , Pain in right toe, pain in left toes  Treatment & Plan Procedures and Treatment: Consent by patient was obtained for treatment procedures.   Debridement of mycotic and hypertrophic toenails, 1 through 5 bilateral and clearing of subungual debris. No ulceration, no infection noted.  Return Visit-Office Procedure: Patient instructed to return to the office for a follow up visit 3 months for continued evaluation and treatment.    Helane Gunther DPM

## 2019-01-13 ENCOUNTER — Telehealth: Payer: Self-pay | Admitting: Internal Medicine

## 2019-01-13 NOTE — Telephone Encounter (Signed)
Copied from CRM [id number]. Topic: Quick Communication - See Telephone Encounter >> Jan 13, 2019  8:47 AM Maia Petties wrote: CRM for notification. See Telephone encounter for: 01/13/19.  Zella Ball called stating pt is at [facility] memory care unit. The nurse there told Zella Ball that they brought a urine sample to Dr. Posey Rea yesterday. Zella Ball normally brings pt to the doctor appts so she is confused. Pt having a pain in her side and is wondering what results are of urine sample. Please call Zella Ball [phone].

## 2019-01-13 NOTE — Telephone Encounter (Signed)
Daughter Zella Ball notified that no sample was dropped off at the office yesterday

## 2019-03-07 ENCOUNTER — Ambulatory Visit: Payer: Medicare Other | Admitting: Internal Medicine

## 2019-03-20 ENCOUNTER — Ambulatory Visit: Payer: Medicare Other | Admitting: Cardiovascular Disease

## 2019-04-18 DIAGNOSIS — Z139 Encounter for screening, unspecified: Secondary | ICD-10-CM | POA: Diagnosis not present

## 2019-05-04 ENCOUNTER — Ambulatory Visit: Payer: Medicare Other | Admitting: Internal Medicine

## 2019-05-09 IMAGING — DX DG LUMBAR SPINE 2-3V
3 series · 3 of 3 positions shown · non-contrast
Comparison: 03/13/2015

CLINICAL DATA: Chronic low back pain, worse during the past week.
No known injury.

EXAM:
LUMBAR SPINE - 2-3 VIEW

[l-spine ap]
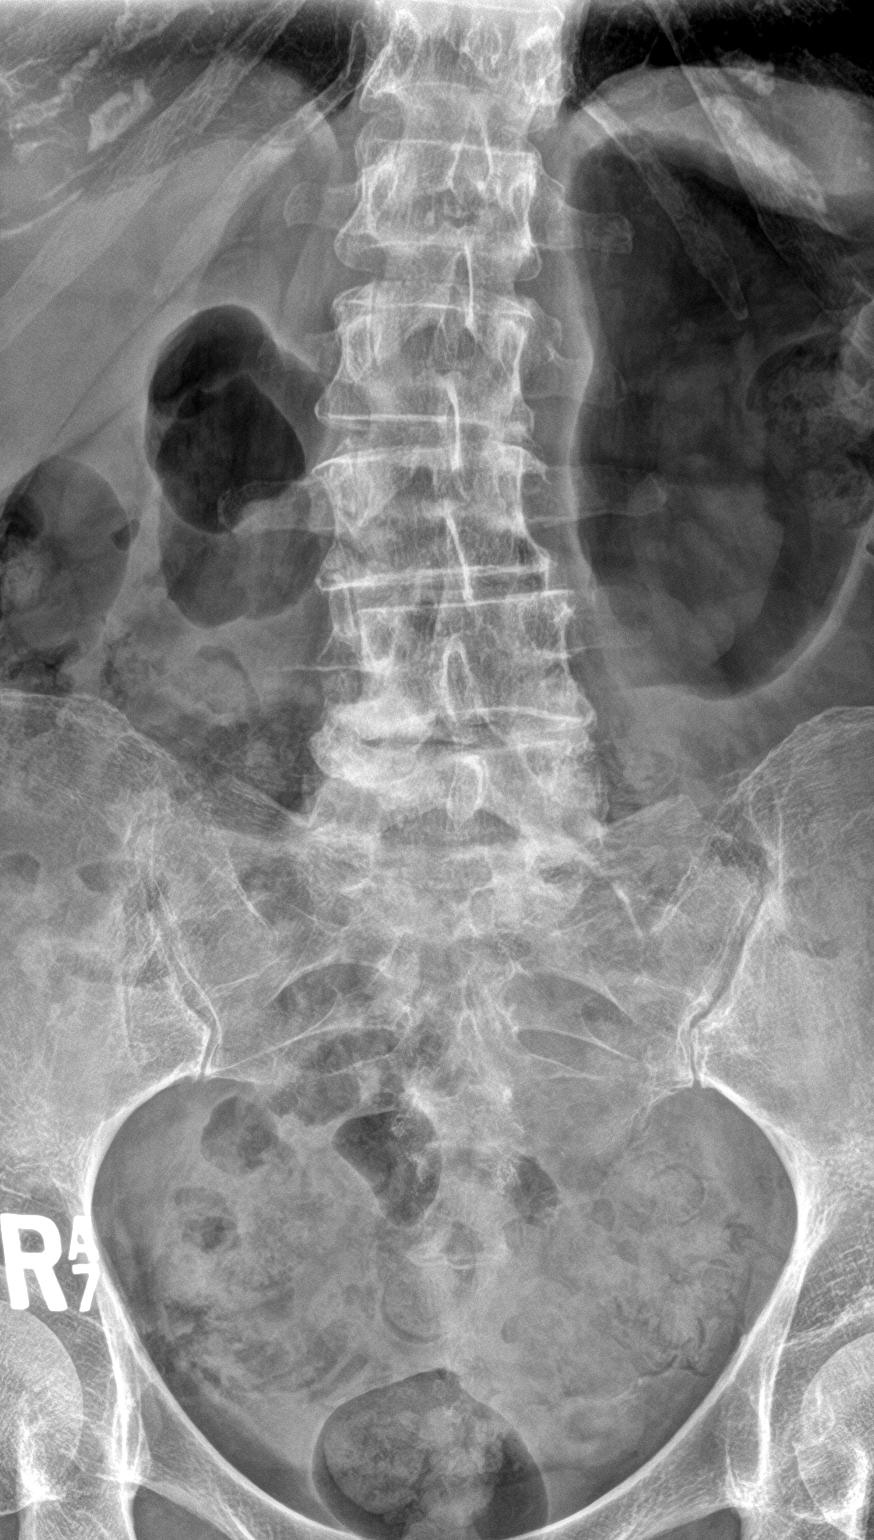

[l-spine lat]
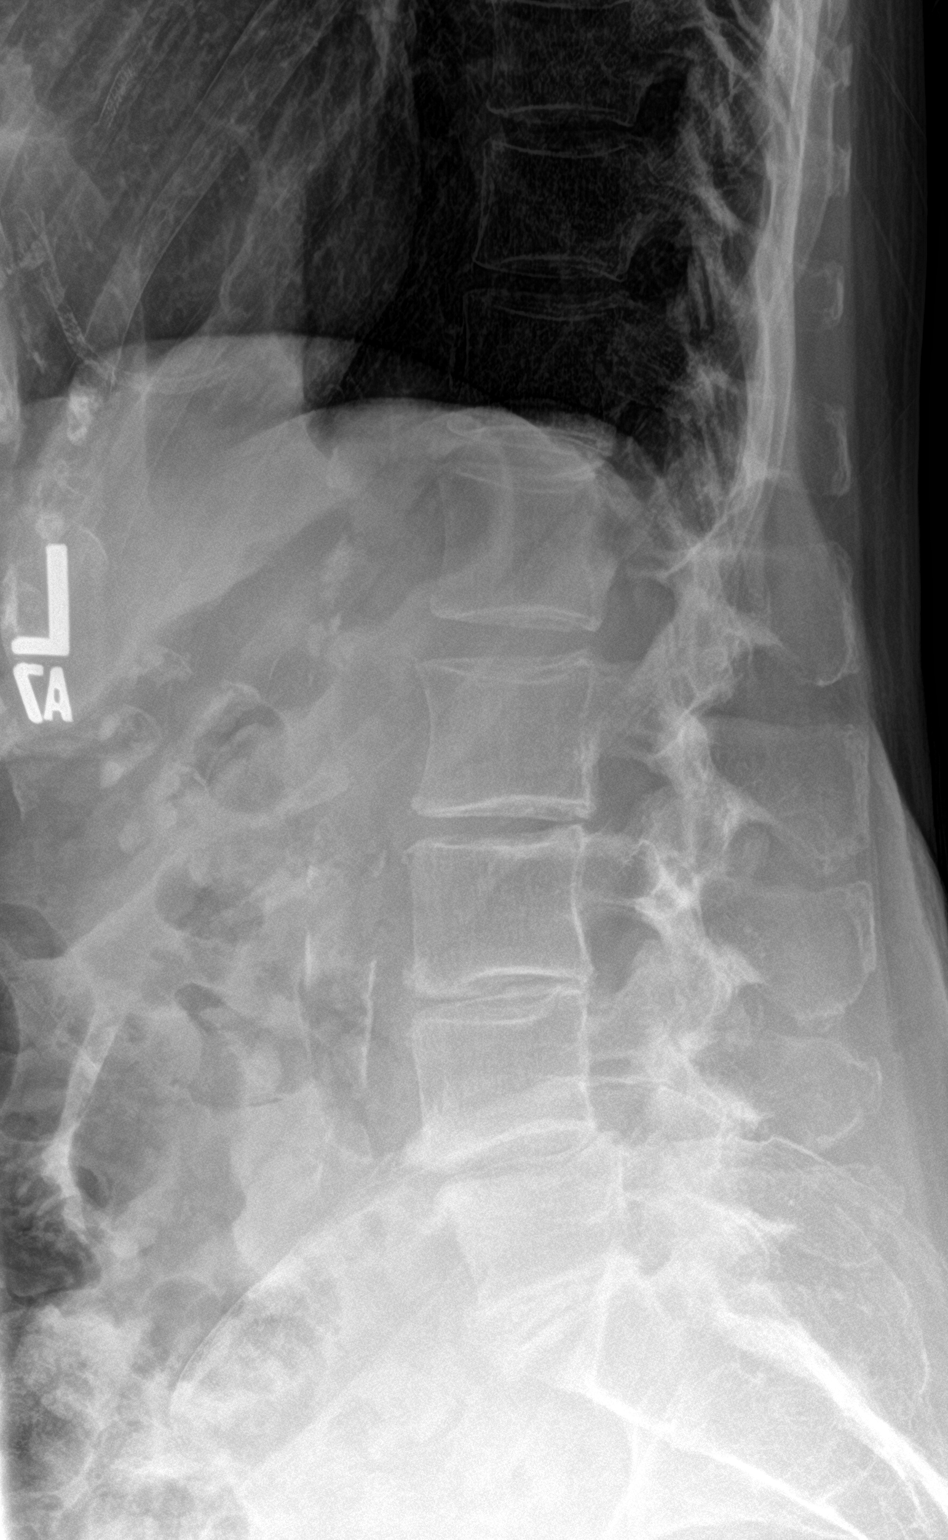

[l-spine spot]
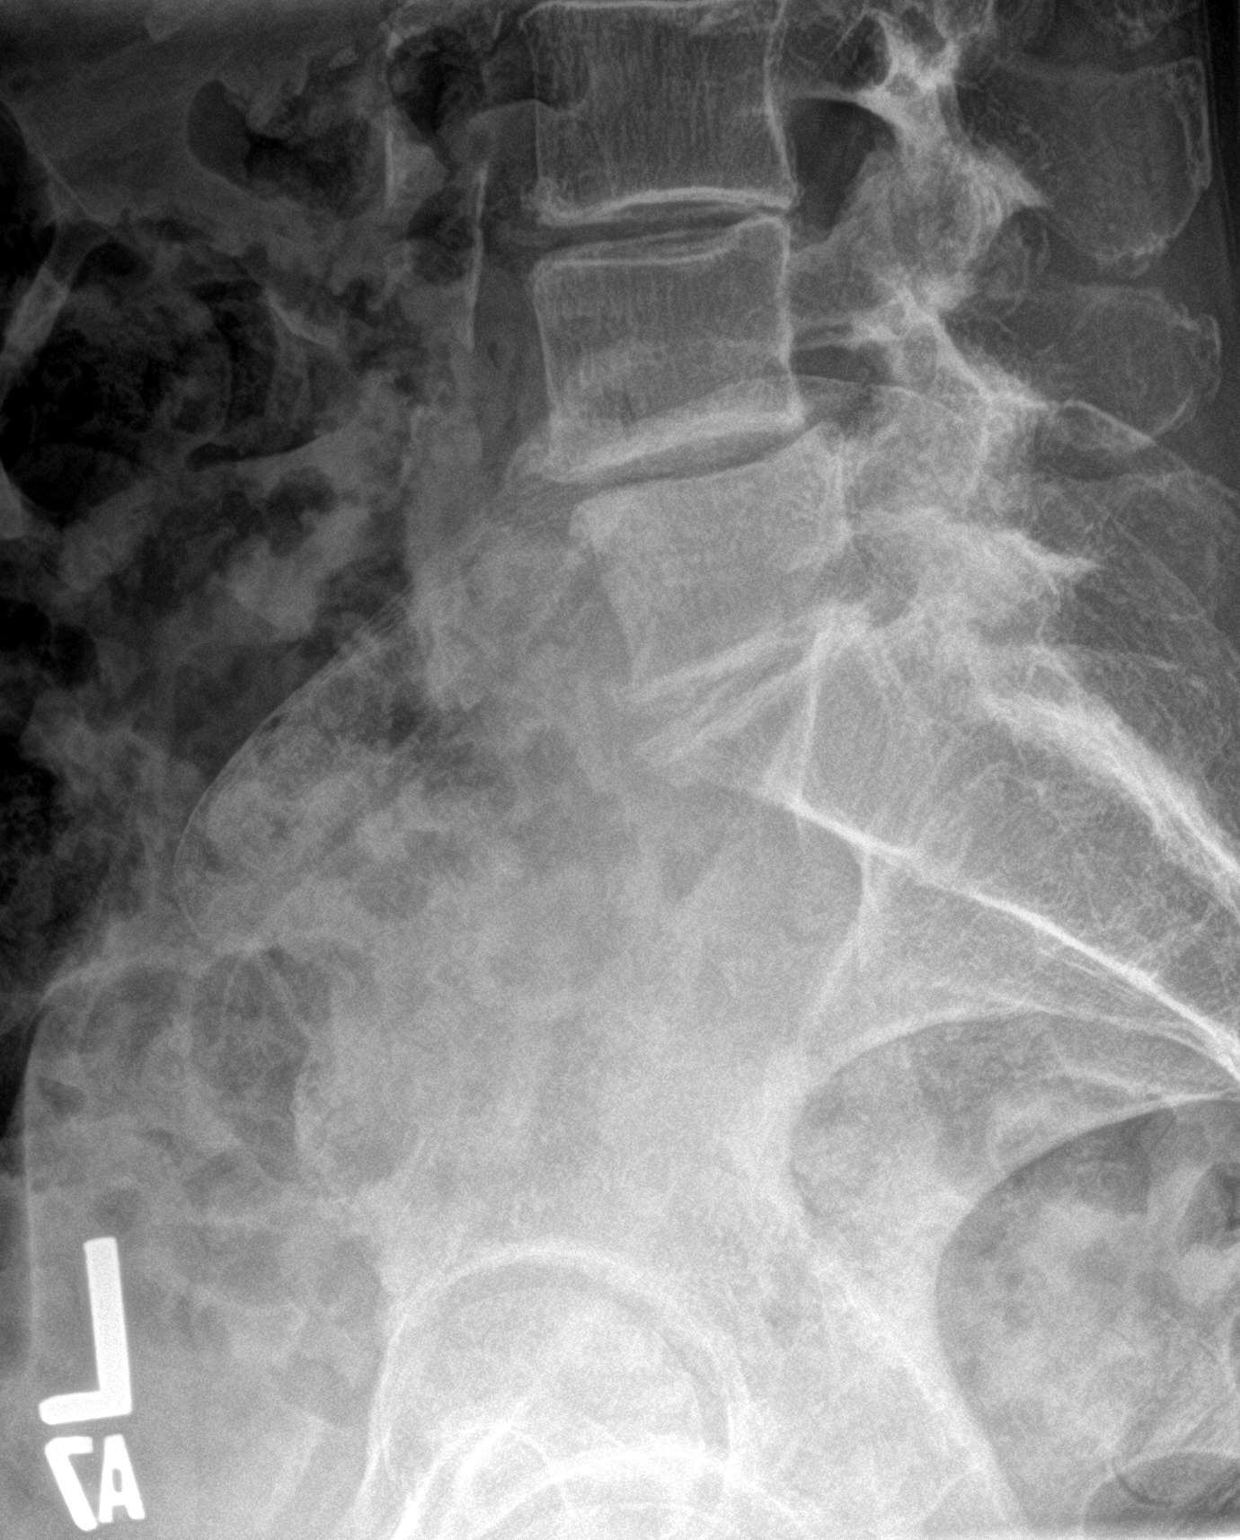

[3 of 3 positions shown; findings below may reference images not displayed]

FINDINGS: There are 5 non rib-bearing lumbar type vertebral bodies.

Mild scoliotic curvature of the thoracolumbar spine with dominant
caudal component convex the right, unchanged. There is mild
straightening expected lumbar lordosis, unchanged. Unchanged mild
(approximately 4 mm) of anterolisthesis of L4 upon L5.

Lumbar vertebral body heights appear preserved

Moderate to severe multilevel lumbar spine DDD, likely worse at
L4-L5 with disc space height loss, endplate irregularity and
sclerosis, progressed compared to the [DATE] examination.

Limited visualization of the bilateral SI joints is normal

Calcified atherosclerotic plaque within the abdominal aorta.
Regional bowel gas pattern is normal.
IMPRESSION: 1. No acute findings.
2. Moderate severe multilevel lumbar spine DDD, likely worse at
L4-L5, progressed compared to the [DATE] examination.
3.  Aortic Atherosclerosis ([id number]).

## 2019-05-16 ENCOUNTER — Ambulatory Visit: Payer: Medicare Other | Admitting: Podiatry

## 2019-06-13 ENCOUNTER — Ambulatory Visit: Payer: Medicare Other | Admitting: Podiatry

## 2019-08-01 ENCOUNTER — Telehealth: Payer: Self-pay | Admitting: Cardiovascular Disease

## 2019-08-01 NOTE — Telephone Encounter (Signed)
Made attempt to contact caregiver to advise that its okay to come with patient to visit due to medical condition but no answer and unable to leave vm.

## 2019-08-01 NOTE — Telephone Encounter (Signed)
I spoke to the patient's daughter Shirlean Mylar) and approved her assistance with the patient's visit on 9/3. (Dimentia).

## 2019-08-01 NOTE — Telephone Encounter (Signed)
New message   Patient's daughter is calling to see if she can come into the office on 08/03/2019. Patient's mother has dimentia.

## 2019-08-02 NOTE — Progress Notes (Signed)
Chief Complaint  Patient presents with  . Follow-up    CAD    History of Present Illness: 85 yo  female with history of CAD s/p PTCA to LAD in 2002 and stenting of the LAD and RCA in 2015, ischemic cardiomyopathy, HTN, HLD and advanced dementia here today for cardiac follow up. She was [facility] 2015 with a non-STEMI. Cardiac catheterization demonstrated high-grade disease in the mid LAD and mid to distal RCA. She underwent PCI with placement of a drug eluting stent in the mid LAD and a drug eluting stent in the distal RCA. Of note, echocardiogram did demonstrate an EF of 50% with moderate to severe mitral regurgitation suspicious for ischemic mitral regurgitation and papillary muscle dysfunction. She was seen by Tereso NewcomerScott Weaver, PA-C for hospital f/u and was feeling better. Echo 07/26/14 with normal LV function and mild MR. Most recent echo March 2019 with normal LV systolic function and mild MR.   She is here today for follow up. The patient denies any chest pain, dyspnea, palpitations, lower extremity edema, orthopnea, PND, dizziness, near syncope or syncope. She has progressive dementia and is now living in a memory care facility.   Primary Care Physician: Tresa GarterPlotnikov, Aleksei V, MD  Past Medical History:  Diagnosis Date  . ANXIETY   . CORONARY ARTERY DISEASE    a. 11/2000 Cath/PTCA: LM 20d, LAD 99p (PTCA), LCX 70p, RCA dom, 116m, EF 30 dist ant AK;  b. 2008 Myoview: EF 75%, small ant apical scar, mild peri-infarct ischemia.  [name]. DEMENTIA   . Dementia (HCC)   . DEPRESSION   . DIVERTICULOSIS, COLON   . HERPES ZOSTER   . History of Ischemic Cardiomyopathy    a. 11/2000 LV gram: EF 30%, dist ant AK;  b. 2008 MV EF 75%.  [name]. Hx of echocardiogram    Echo (8/15):  EF 55%, mild apical HK, Gr 1 DD, mild AI, mild MR, severe LAE, mod RAE, mod PI, PASP 38 mmHg  . HYPERLIPIDEMIA   . HYPERTENSION   . OSTEOPOROSIS   . VERTIGO     Past Surgical History:  Procedure Laterality Date  .  LEFT HEART CATHETERIZATION WITH CORONARY ANGIOGRAM N/A 05/28/2014   Procedure: LEFT HEART CATHETERIZATION WITH CORONARY ANGIOGRAM;  Surgeon: Kathleene Hazelhristopher D Auna Mikkelsen, MD;  [facility];  Service: Cardiovascular;  Laterality: N/A;  . PERCUTANEOUS CORONARY STENT INTERVENTION (PCI-S)  05/28/2014   Procedure: PERCUTANEOUS CORONARY STENT INTERVENTION (PCI-S);  Surgeon: Kathleene Hazelhristopher D Zamire Whitehurst, MD;  [facility] C[facility] LAB;  Service: Cardiovascular;;  . PTCA  12/09/2000   successful; of the proximal left anterior descending with reduction of 99% narrowing to 30% with improvement of TIMI grade 1 to TIMI grade 3 flow  . TONSILLECTOMY AND ADENOIDECTOMY  1943    Current Outpatient Medications  Medication Sig Dispense Refill  . acetaminophen (TYLENOL) 325 MG tablet Take 2 tablets (650 mg total) by mouth every 4 (four) hours as needed for headache or mild pain.    [name]. amLODipine (NORVASC) 5 MG tablet Take 0.5 tablets (2.5 mg total) by mouth daily. 45 tablet 3  . atorvastatin (LIPITOR) 20 MG tablet Take 0.5 tablets (10 mg total) by mouth daily. 45 tablet 3  . carvedilol (COREG) 12.5 MG tablet TAKE 1 TABLET BY MOUTH TWICE DAILY WITH A MEAL 180 tablet 3  . Cholecalciferol ([employer] VITAMIN D3) 1000 UNITS capsule Take 1 capsule (1,000 Units total) by mouth daily. 100 capsule 3  . clopidogrel (PLAVIX) 75  MG tablet Take 1 tablet (75 mg total) by mouth daily. 90 tablet 3  . Memantine HCl-Donepezil HCl (NAMZARIC) 28-10 MG CP24 Take 1 capsule by mouth daily. 90 capsule 1  . mirtazapine (REMERON) 15 MG tablet Take 1 tablet (15 mg total) by mouth at bedtime. 90 tablet 1  . nitroGLYCERIN (NITROSTAT) 0.4 MG SL tablet Place 1 tablet (0.4 mg total) under the tongue every 5 (five) minutes as needed for chest pain. 25 tablet 0  . potassium chloride (KLOR-CON) 8 MEQ tablet Take 1 tablet (8 mEq total) by mouth daily. 30 tablet 0   No current facility-administered medications for this visit.     Allergies  Allergen Reactions   . Simvastatin Other (See Comments)    REACTION: memory problem, myalgia    Social History   Socioeconomic History  . Marital status: Widowed    Spouse name: Not on file  . Number of children: Not on file  . Years of education: Not on file  . Highest education level: Not on file  Occupational History    Employer: RETIRED    Comment: Retired  Scientific laboratory technician  . Financial resource strain: Not on file  . Food insecurity    Worry: Not on file    Inability: Not on file  . Transportation needs    Medical: Not on file    Non-medical: Not on file  Tobacco Use  . Smoking status: Never Smoker  . Smokeless tobacco: Never Used  Substance and Sexual Activity  . Alcohol use: No  . Drug use: No  . Sexual activity: Never  Lifestyle  . Physical activity    Days per week: Not on file    Minutes per session: Not on file  . Stress: Not on file  Relationships  . Social Herbalist on phone: Not on file    Gets together: Not on file    Attends religious service: Not on file    Active member of club or organization: Not on file    Attends meetings of clubs or organizations: Not on file    Relationship status: Not on file  . Intimate partner violence    Fear of current or ex partner: Not on file    Emotionally abused: Not on file    Physically abused: Not on file    Forced sexual activity: Not on file  Other Topics Concern  . Not on file  Social History Narrative   Regular exercise-yes    Family History  Problem Relation Age of Onset  . Diabetes Mother   . Heart attack Mother   . Sudden death Mother   . Hypertension Other   . Heart disease Other   . Coronary artery disease Other   . Hypertension Daughter     Review of Systems:  As stated in the HPI and otherwise negative.   BP (!) 150/68   Pulse 76   Ht 5\' 2"  (1.575 m)   Wt 163 lb 6.4 oz (74.1 kg)   SpO2 95%   BMI 29.89 kg/m   Physical Examination:  General: Well developed, well nourished, NAD  HEENT: OP  clear, mucus membranes moist  SKIN: warm, dry. No rashes. Neuro: No focal deficits  Musculoskeletal: Muscle strength 5/5 all ext  Psychiatric: Mood and affect normal  Neck: No JVD, no carotid bruits, no thyromegaly, no lymphadenopathy.  Lungs:Clear bilaterally, no wheezes, rhonci, crackles Cardiovascular: Regular rate and rhythm. No murmurs, gallops or rubs. Abdomen:Soft. Bowel sounds present.  Non-tender.  Extremities: No lower extremity edema. Pulses are 2 + in the bilateral DP/PT.  LHC (05/28/14): Distal left main 20%, proximal LAD 30%, mid LAD 99%, OM 50-60, proximal RCA 30%, mid RCA 80%/95%. >>> PCI: 2.5 x 15 mm Xience DES in the mid LAD and 2.5 x 38 mm Xience DES in distal RCA  Left main: 20% distal stenosis.  Left Anterior Descending Artery: Large caliber vessel that courses to the apex. The proximal vessel has diffuse 30% stenosis. The mid vessel has a focal 99% stenosis. There are several small caliber diagonal branches.  Circumflex Artery: Large caliber vessel that terminates into a large caliber obtuse marginal branch. The OM branch has a focal 50-60% stenosis.  Right Coronary Artery: Large dominant vessel with 30% proximal stenosis, 80% mid stenosis followed by serial 95% stenoses in the distal vessel. The posterolateral branch and PDA are moderate in caliber and patent with mild diffuse disease.   Echo 02/23/18: Left ventricle: The cavity size was normal. Wall thickness was   normal. Systolic function was normal. The estimated ejection   fraction was in the range of 60% to 65%. Wall motion was normal;   there were no regional wall motion abnormalities. Doppler   parameters are consistent with abnormal left ventricular   relaxation (grade 1 diastolic dysfunction). The E/e&' ratio is   between 8-15, suggesting indeterminate LV filling pressure. - Aortic valve: Sclerosis without stenosis. There was trivial   regurgitation. - Mitral valve: Mildly thickened leaflets . There was mild    regurgitation. - Left atrium: The atrium was normal in size. - Tricuspid valve: There was mild regurgitation. - Pulmonary arteries: PA peak pressure: 29 mm Hg (S). - Inferior vena cava: The vessel was normal in size. The   respirophasic diameter changes were in the normal range (= 50%),   consistent with normal central venous pressure.  Impressions:  - Compared to a prior study in 2015, the LVEF is higher at 60-65%.  EKG:  EKG is ordered today. The ekg ordered today demonstrates NSR, rate 76 bpm.   Recent Labs: 12/06/2018: ALT 17; BUN 19; Creatinine, Ser 1.23; Potassium 3.4; Sodium 141; TSH 3.80   Lipid Panel    Component Value Date/Time   CHOL 145 05/28/2017 0903   TRIG 228.0 (H) 05/28/2017 0903   HDL 41.00 05/28/2017 0903   CHOLHDL 4 05/28/2017 0903   VLDL 45.6 (H) 05/28/2017 0903   LDLCALC 60 09/06/2015 1052   LDLDIRECT 61.0 05/28/2017 0903     Wt Readings from Last 3 Encounters:  08/03/19 163 lb 6.4 oz (74.1 kg)  12/06/18 164 lb (74.4 kg)  08/29/18 160 lb (72.6 kg)     Other studies Reviewed: Additional studies/ records that were reviewed today include: . Review of the above records demonstrates:    Assessment and Plan:   1. Coronary artery disease without angina: She has no chest pain. Continue Plavix, beta blocker and statin.        2. Mitral regurgitation: Mild by echo in March 2019.    3. Hyperlipidemia: Her LDL was at goal in 2018. Will not repeat lipids given progression of memory issues. She is now living in a memory unit. Continue statin   4. Essential hypertension: BP is mildly elevated. No changes.   Current medicines are reviewed at length with the patient today.  The patient does not have concerns regarding medicines.  The following changes have been made:  no change  Labs/ tests ordered today include:  Orders Placed This Encounter  Procedures  . EKG 12-Lead    Disposition:   FU with me in 12  months  Signed, Verne Carrow, MD  08/03/2019 3:19 PM    [facility] [address] [place], [place], [place]  [address] Phone: [phone]; Fax: [phone]

## 2019-08-03 ENCOUNTER — Encounter: Payer: Self-pay | Admitting: Cardiovascular Disease

## 2019-08-03 ENCOUNTER — Other Ambulatory Visit: Payer: Self-pay

## 2019-08-03 ENCOUNTER — Ambulatory Visit (INDEPENDENT_AMBULATORY_CARE_PROVIDER_SITE_OTHER): Payer: Medicare Other | Admitting: Cardiovascular Disease

## 2019-08-03 VITALS — BP 150/68 | HR 76 | Ht 62.0 in | Wt 163.4 lb

## 2019-08-03 DIAGNOSIS — I340 Nonrheumatic mitral (valve) insufficiency: Secondary | ICD-10-CM | POA: Diagnosis not present

## 2019-08-03 DIAGNOSIS — E7800 Pure hypercholesterolemia, unspecified: Secondary | ICD-10-CM | POA: Diagnosis not present

## 2019-08-03 DIAGNOSIS — I2510 Atherosclerotic heart disease of native coronary artery without angina pectoris: Secondary | ICD-10-CM | POA: Diagnosis not present

## 2019-08-03 DIAGNOSIS — I10 Essential (primary) hypertension: Secondary | ICD-10-CM

## 2019-08-03 NOTE — Patient Instructions (Signed)

## 2019-09-12 ENCOUNTER — Other Ambulatory Visit: Payer: Self-pay

## 2019-09-12 ENCOUNTER — Ambulatory Visit (INDEPENDENT_AMBULATORY_CARE_PROVIDER_SITE_OTHER): Payer: Medicare Other | Admitting: Podiatry

## 2019-09-12 ENCOUNTER — Encounter: Payer: Self-pay | Admitting: Podiatry

## 2019-09-12 DIAGNOSIS — B351 Tinea unguium: Secondary | ICD-10-CM

## 2019-09-12 DIAGNOSIS — M79674 Pain in right toe(s): Secondary | ICD-10-CM | POA: Diagnosis not present

## 2019-09-12 DIAGNOSIS — M79675 Pain in left toe(s): Secondary | ICD-10-CM

## 2019-09-12 DIAGNOSIS — D689 Coagulation defect, unspecified: Secondary | ICD-10-CM | POA: Diagnosis not present

## 2019-09-12 NOTE — Progress Notes (Signed)
Complaint:  Visit Type: Patient presents  to my office for  preventative foot care services. Complaint: Patient states" my nails have grown long and thick and become painful to walk and wear shoes"   She presents to the office with history of taking plavix. The patient presents for preventative foot care services. No changes to ROS.  Patient was dropped off by her facility and met her daughter here.  Podiatric Exam: Vascular: dorsalis pedis and posterior tibial pulses are palpable bilateral. Capillary return is immediate. Temperature gradient is WNL. Skin turgor WNL  Sensorium: Normal Semmes Weinstein monofilament test. Normal tactile sensation bilaterally. Nail Exam: Pt has thick disfigured discolored nails with subungual debris noted bilateral entire nail hallux through fifth toenails Ulcer Exam: There is no evidence of ulcer or pre-ulcerative changes or infection. Orthopedic Exam: Muscle tone and strength are WNL. No limitations in general ROM. No crepitus or effusions noted. Foot type and digits show no abnormalities. Bony prominences are unremarkable. Skin: No Porokeratosis. No infection or ulcers  Diagnosis:  Onychomycosis, , Pain in right toe, pain in left toes  Treatment & Plan Procedures and Treatment: Consent by patient was obtained for treatment procedures.   Debridement of mycotic and hypertrophic toenails, 1 through 5 bilateral and clearing of subungual debris. No ulceration, no infection noted.  Return Visit-Office Procedure: Patient instructed to return to the office for a follow up visit prn for continued evaluation and treatment.    Gardiner Barefoot DPM

## 2019-11-08 ENCOUNTER — Emergency Department (HOSPITAL_COMMUNITY)
Admission: EM | Admit: 2019-11-08 | Discharge: 2019-11-08 | Disposition: A | Discharge status: Other Acute Inpatient Hospital | Payer: Medicare Other | Attending: Emergency Medicine | Admitting: Emergency Medicine

## 2019-11-08 ENCOUNTER — Emergency Department (HOSPITAL_COMMUNITY): Payer: Medicare Other

## 2019-11-08 DIAGNOSIS — F0390 Unspecified dementia without behavioral disturbance: Secondary | ICD-10-CM | POA: Diagnosis not present

## 2019-11-08 DIAGNOSIS — M2550 Pain in unspecified joint: Secondary | ICD-10-CM | POA: Diagnosis not present

## 2019-11-08 DIAGNOSIS — Z7401 Bed confinement status: Secondary | ICD-10-CM | POA: Diagnosis not present

## 2019-11-08 DIAGNOSIS — R0902 Hypoxemia: Secondary | ICD-10-CM | POA: Diagnosis not present

## 2019-11-08 DIAGNOSIS — N3091 Cystitis, unspecified with hematuria: Secondary | ICD-10-CM | POA: Insufficient documentation

## 2019-11-08 DIAGNOSIS — I10 Essential (primary) hypertension: Secondary | ICD-10-CM | POA: Insufficient documentation

## 2019-11-08 DIAGNOSIS — Z20828 Contact with and (suspected) exposure to other viral communicable diseases: Secondary | ICD-10-CM | POA: Diagnosis not present

## 2019-11-08 DIAGNOSIS — R918 Other nonspecific abnormal finding of lung field: Secondary | ICD-10-CM | POA: Diagnosis not present

## 2019-11-08 DIAGNOSIS — R404 Transient alteration of awareness: Secondary | ICD-10-CM | POA: Diagnosis not present

## 2019-11-08 DIAGNOSIS — Z79899 Other long term (current) drug therapy: Secondary | ICD-10-CM | POA: Diagnosis not present

## 2019-11-08 DIAGNOSIS — R5381 Other malaise: Secondary | ICD-10-CM | POA: Diagnosis not present

## 2019-11-08 DIAGNOSIS — Z03818 Encounter for observation for suspected exposure to other biological agents ruled out: Secondary | ICD-10-CM | POA: Diagnosis not present

## 2019-11-08 DIAGNOSIS — Z743 Need for continuous supervision: Secondary | ICD-10-CM | POA: Diagnosis not present

## 2019-11-08 DIAGNOSIS — U071 COVID-19: Secondary | ICD-10-CM | POA: Diagnosis not present

## 2019-11-08 DIAGNOSIS — R630 Anorexia: Secondary | ICD-10-CM | POA: Diagnosis present

## 2019-11-08 DIAGNOSIS — Z7902 Long term (current) use of antithrombotics/antiplatelets: Secondary | ICD-10-CM | POA: Insufficient documentation

## 2019-11-08 DIAGNOSIS — N3090 Cystitis, unspecified without hematuria: Secondary | ICD-10-CM

## 2019-11-08 LAB — COMPREHENSIVE METABOLIC PANEL
ALT: 18 U/L (ref 0–44)
AST: 27 U/L (ref 15–41)
Albumin: 3.5 g/dL (ref 3.5–5.0)
Alkaline Phosphatase: 69 U/L (ref 38–126)
Anion gap: 12 (ref 5–15)
BUN: 18 mg/dL (ref 8–23)
CO2: 23 mmol/L (ref 22–32)
Calcium: 8.6 mg/dL — ABNORMAL LOW (ref 8.9–10.3)
Chloride: 110 mmol/L (ref 98–111)
Creatinine, Ser: 0.96 mg/dL (ref 0.44–1.00)
GFR calc Af Amer: >60 mL/min (ref >60)
GFR calc non Af Amer: 52 mL/min — ABNORMAL LOW (ref >60)
Glucose, Bld: 129 mg/dL — ABNORMAL HIGH (ref 70–99)
Potassium: 3.7 mmol/L (ref 3.5–5.1)
Sodium: 145 mmol/L (ref 135–145)
Total Bilirubin: 0.3 mg/dL (ref 0.3–1.2)
Total Protein: 7.2 g/dL (ref 6.5–8.1)

## 2019-11-08 LAB — POC SARS CORONAVIRUS 2 AG -  ED: SARS Coronavirus 2 Ag: NEGATIVE

## 2019-11-08 LAB — URINALYSIS, ROUTINE W REFLEX MICROSCOPIC
Bilirubin Urine: NEGATIVE
Glucose, UA: NEGATIVE mg/dL
Ketones, ur: 5 mg/dL — AB
Leukocytes,Ua: NEGATIVE
Nitrite: POSITIVE — AB
Protein, ur: 100 mg/dL — AB
Specific Gravity, Urine: 1.018 (ref 1.005–1.030)
pH: 6 (ref 5.0–8.0)

## 2019-11-08 LAB — CBC WITH DIFFERENTIAL/PLATELET
Abs Immature Granulocytes: 0.04 10*3/uL (ref 0.00–0.07)
Basophils Absolute: 0 10*3/uL (ref 0.0–0.1)
Basophils Relative: 0 %
Eosinophils Absolute: 0 10*3/uL (ref 0.0–0.5)
Eosinophils Relative: 0 %
HCT: 47.2 % — ABNORMAL HIGH (ref 36.0–46.0)
Hemoglobin: 14.8 g/dL (ref 12.0–15.0)
Immature Granulocytes: 1 %
Lymphocytes Relative: 35 %
Lymphs Abs: 1.9 10*3/uL (ref 0.7–4.0)
MCH: 29.4 pg (ref 26.0–34.0)
MCHC: 31.4 g/dL (ref 30.0–36.0)
MCV: 93.7 fL (ref 80.0–100.0)
Monocytes Absolute: 0.7 10*3/uL (ref 0.1–1.0)
Monocytes Relative: 14 %
Neutro Abs: 2.7 10*3/uL (ref 1.7–7.7)
Neutrophils Relative %: 50 %
Platelets: 135 10*3/uL — ABNORMAL LOW (ref 150–400)
RBC: 5.04 MIL/uL (ref 3.87–5.11)
RDW: 14.9 % (ref 11.5–15.5)
WBC: 5.4 10*3/uL (ref 4.0–10.5)
nRBC: 0 % (ref 0.0–0.2)

## 2019-11-08 LAB — TROPONIN I (HIGH SENSITIVITY)
Troponin I (High Sensitivity): 14 ng/L (ref <18)
Troponin I (High Sensitivity): 15 ng/L (ref <18)

## 2019-11-08 MED ORDER — CEPHALEXIN 500 MG PO CAPS: 500.0000 mg | ORAL_CAPSULE | Freq: Two times a day (BID) | ORAL | 0 refills | Status: AC

## 2019-11-08 MED ORDER — CEPHALEXIN 250 MG PO CAPS
500.0000 mg | ORAL_CAPSULE | Freq: Once | ORAL | Status: AC
  Administered 2019-11-08: 500 mg via ORAL
  Filled 2019-11-08: qty 2

## 2019-11-08 MED ORDER — SODIUM CHLORIDE 0.9 % IV BOLUS
1000.0000 mL | Freq: Once | INTRAVENOUS | Status: AC
  Administered 2019-11-08: 1000 mL via INTRAVENOUS

## 2019-11-08 NOTE — ED Provider Notes (Signed)
[facility] EMERGENCY DEPARTMENT Provider Note   CSN: [id number] Arrival date & time: 11/08/19  1720     History   Chief Complaint Poor PO intake  HPI [name] is a 85 y.o. female with a history advanced dementia, presenting from a dementia ward with concern for flulike illness.  Staff reports to EMS that the patient has had diminished appetite for 2 days.  They believe she looks dehydrated.  They say she has not been acting like herself.  The patient herself has no acute complaints and is pleasantly demented on exam.  She denies pain anywhere.  She denies abdominal pain or vomiting.  She does have a significant coronary history and has multiple stents placed within the past 10 years.  Per EMS, there are multiple other patients in the same unit the test positive for Covid.  It is unclear if or when this patient may have been last tested for COVID.   Exam - crackles in LLL No peripheral edema     HPI  Past Medical History:  Diagnosis Date  . ANXIETY   . CORONARY ARTERY DISEASE    a. 11/2000 Cath/PTCA: LM 20d, LAD 99p (PTCA), LCX 70p, RCA dom, 11m, EF 30 dist ant AK;  b. 2008 Myoview: EF 75%, small ant apical scar, mild peri-infarct ischemia.  [name] DEMENTIA   . Dementia (HCC)   . DEPRESSION   . DIVERTICULOSIS, COLON   . HERPES ZOSTER   . History of Ischemic Cardiomyopathy    a. 11/2000 LV gram: EF 30%, dist ant AK;  b. 2008 MV EF 75%.  [name] Hx of echocardiogram    Echo (8/15):  EF 55%, mild apical HK, Gr 1 DD, mild AI, mild MR, severe LAE, mod RAE, mod PI, PASP 38 mmHg  . HYPERLIPIDEMIA   . HYPERTENSION   . OSTEOPOROSIS   . VERTIGO     Patient Active Problem List   Diagnosis Date Noted  . Cough 12/06/2018  . Low back pain 03/13/2015  . NSTEMI (non-ST elevated myocardial infarction) (HCC) 05/26/2014  . Acute URI 03/06/2014  . Pneumonia involving right lung 03/06/2014  . Anogenital lichen sclerosus 11/01/2013  . Well adult exam 05/11/2013  . Chills  04/04/2013  . Chronic low back pain 05/30/2012  . Grief reaction 07/15/2011  . Fatigue 04/21/2011  . Dizzy spells 04/21/2011  . Sinusitis, chronic 04/21/2011  . ALLERGIC RHINITIS 02/16/2011  . Alzheimer's dementia (HCC) 11/01/2010  . Anxiety state 10/28/2010  . ACTINIC KERATOSIS 10/28/2010  . Cystitis 08/22/2009  . HERPES ZOSTER 05/23/2009  . Situational depression 12/05/2008  . VERTIGO 04/16/2008  . Memory loss 04/16/2008  . Dyslipidemia 10/25/2007  . Essential hypertension 10/25/2007  . DIVERTICULOSIS, COLON 10/25/2007  . Coronary atherosclerosis 09/26/2007  . OSTEOPOROSIS 09/26/2007    Past Surgical History:  Procedure Laterality Date  . LEFT HEART CATHETERIZATION WITH CORONARY ANGIOGRAM N/A 05/28/2014   Procedure: LEFT HEART CATHETERIZATION WITH CORONARY ANGIOGRAM;  Surgeon: Kathleene Hazel, MD;  Location: [facility] CATH LAB;  Service: Cardiovascular;  Laterality: N/A;  . PERCUTANEOUS CORONARY STENT INTERVENTION (PCI-S)  05/28/2014   Procedure: PERCUTANEOUS CORONARY STENT INTERVENTION (PCI-S);  Surgeon: Kathleene Hazel, MD;  Location: [facility] CATH LAB;  Service: Cardiovascular;;  . PTCA  12/09/2000   successful; of the proximal left anterior descending with reduction of 99% narrowing to 30% with improvement of TIMI grade 1 to TIMI grade 3 flow  . TONSILLECTOMY AND ADENOIDECTOMY  1943     OB History  Gravida  2   Para  2   Term      Preterm      AB      Living  2     SAB      TAB      Ectopic      Multiple      Live Births               Home Medications    Prior to Admission medications   Medication Sig Start Date End Date Taking? Authorizing Provider  acetaminophen (TYLENOL) 325 MG tablet Take 2 tablets (650 mg total) by mouth every 4 (four) hours as needed for headache or mild pain. 05/29/14   Abelino Derrick, PA-C  amLODipine (NORVASC) 5 MG tablet Take 0.5 tablets (2.5 mg total) by mouth daily. 06/01/18   Plotnikov, Georgina Quint, MD  atorvastatin  (LIPITOR) 20 MG tablet Take 0.5 tablets (10 mg total) by mouth daily. 06/01/18   Plotnikov, Georgina Quint, MD  carvedilol (COREG) 12.5 MG tablet TAKE 1 TABLET BY MOUTH TWICE DAILY WITH A MEAL 06/01/18   Plotnikov, Georgina Quint, MD  cephALEXin (KEFLEX) 500 MG capsule Take 1 capsule (500 mg total) by mouth 2 (two) times daily for 7 days. 11/09/19 12/02/2019  Terald Sleeper, MD  Cholecalciferol (CVS VITAMIN D3) 1000 UNITS capsule Take 1 capsule (1,000 Units total) by mouth daily. 12/19/14   Plotnikov, Georgina Quint, MD  clopidogrel (PLAVIX) 75 MG tablet Take 1 tablet (75 mg total) by mouth daily. 06/01/18   Plotnikov, Georgina Quint, MD  Memantine HCl-Donepezil HCl (NAMZARIC) 28-10 MG CP24 Take 1 capsule by mouth daily. 06/01/18   Plotnikov, Georgina Quint, MD  mirtazapine (REMERON) 15 MG tablet Take 1 tablet (15 mg total) by mouth at bedtime. 06/01/18   Plotnikov, Georgina Quint, MD  nitroGLYCERIN (NITROSTAT) 0.4 MG SL tablet Place 1 tablet (0.4 mg total) under the tongue every 5 (five) minutes as needed for chest pain. 01/05/18   Plotnikov, Georgina Quint, MD  potassium chloride (KLOR-CON) 8 MEQ tablet Take 1 tablet (8 mEq total) by mouth daily. 12/07/18 12/07/19  Plotnikov, Georgina Quint, MD    Family History Family History  Problem Relation Age of Onset  . Diabetes Mother   . Heart attack Mother   . Sudden death Mother   . Hypertension Other   . Heart disease Other   . Coronary artery disease Other   . Hypertension Daughter     Social History Social History   Tobacco Use  . Smoking status: Never Smoker  . Smokeless tobacco: Never Used  Substance Use Topics  . Alcohol use: No  . Drug use: No     Allergies   Simvastatin   Review of Systems Review of Systems  Unable to perform ROS: Dementia (Level 5 caveat)     Physical Exam Updated Vital Signs BP (!) 150/115   Pulse 77   Temp 98.9 F (37.2 C) (Oral)   Resp 20   SpO2 97%   Physical Exam Vitals signs and nursing note reviewed.  Constitutional:      General: She  is not in acute distress.    Appearance: She is well-developed.  HENT:     Head: Normocephalic and atraumatic.  Eyes:     Conjunctiva/sclera: Conjunctivae normal.  Neck:     Musculoskeletal: Neck supple.  Cardiovascular:     Rate and Rhythm: Normal rate and regular rhythm.     Pulses: Normal pulses.  Pulmonary:  Effort: Pulmonary effort is normal. No respiratory distress.     Comments: Crackles in LLL 100% on room air Abdominal:     General: Abdomen is flat. Bowel sounds are normal. There is no distension.     Palpations: Abdomen is soft.     Tenderness: There is no abdominal tenderness.  Musculoskeletal:        General: No swelling or tenderness.  Skin:    General: Skin is warm and dry.  Neurological:     General: No focal deficit present.     Mental Status: She is alert and oriented to person, place, and time.      ED Treatments / Results  Labs (all labs ordered are listed, but only abnormal results are displayed) Labs Reviewed  COMPREHENSIVE METABOLIC PANEL - Abnormal; Notable for the following components:      Result Value   Glucose, Bld 129 (*)    Calcium 8.6 (*)    GFR calc non Af Amer 52 (*)    All other components within normal limits  CBC WITH DIFFERENTIAL/PLATELET - Abnormal; Notable for the following components:   HCT 47.2 (*)    Platelets 135 (*)    All other components within normal limits  URINALYSIS, ROUTINE W REFLEX MICROSCOPIC - Abnormal; Notable for the following components:   APPearance HAZY (*)    Hgb urine dipstick SMALL (*)    Ketones, ur 5 (*)    Protein, ur 100 (*)    Nitrite POSITIVE (*)    Bacteria, UA MANY (*)    All other components within normal limits  URINE CULTURE  POC SARS CORONAVIRUS 2 AG -  ED  TROPONIN I (HIGH SENSITIVITY)  TROPONIN I (HIGH SENSITIVITY)    EKG EKG Interpretation  Date/Time:  Wednesday November 08 2019 17:38:43 EST Ventricular Rate:  70 PR Interval:    QRS Duration: 98 QT Interval:  434 QTC  Calculation: 469 R Axis:   7 Text Interpretation: Sinus rhythm Probable left atrial enlargement Low voltage, precordial leads Anteroseptal infarct, old No STEMI Confirmed by Alvester Chourifan, Dalyla Chui [phone]([id number]) on 11/08/2019 5:45:45 PM   Radiology DG Chest 1 View  Result Date: 11/08/2019 CLINICAL DATA:  Hypoxic, COVID-19 positive, history of COPD EXAM: CHEST  1 VIEW COMPARISON:  Radiograph 12/06/2018 FINDINGS: There are coarse basilar interstitial changes similar to comparison exam. Some additional streaky opacities in the lung bases are present with associated volume loss. No pneumothorax or effusion. No convincing features of edema. The cardiomediastinal contours are unremarkable. No acute osseous or soft tissue abnormality. Degenerative changes are present in the imaged spine. IMPRESSION: Findings are compatible with chronic interstitial lung changes. Atelectasis or superimposed infiltrate at the lung bases cannot be excluded. Electronically Signed   By: Kreg ShropshirePrice  DeHay M.D.   On: 11/08/2019 18:57    Procedures Procedures (including critical care time)  Medications Ordered in ED Medications  sodium chloride 0.9 % bolus 1,000 mL (0 mLs Intravenous Stopped 11/08/19 1908)  cephALEXin (KEFLEX) capsule 500 mg (500 mg Oral Given 11/08/19 2046)     Initial Impression / Assessment and Plan / ED Course  I have reviewed the triage vital signs and the nursing notes.  Pertinent labs & imaging results that were available during my care of the patient were reviewed by me and considered in my medical decision making (see chart for details).   85 year old female is here from dementia unit, with concerns from staff that the patient has had some congestion and had poor p.o. intake for  approximately 2 days.  Appear to be multiple Covid positive patients on this dementia ward.  The patient herself has no acute complaints.  She tells me she feels fine.  She is pleasantly demented.  She has some mild crackles in her left lower  lobe, but otherwise no signs of respiratory distress.  We will check her electrolytes and obtain a rapid Covid test while chest x-ray and UA as an infection and dehydration workup.  Check hgb as well.  No signs of head trauma or stroke, no obvious neurological deficit.s   She is full code   Clinical Course as of Nov 08 1337  Wed Nov 08, 2019  2033 Bacteria, UA(!): MANY [MT]  2033 Nitrite(!): POSITIVE [MT]    Clinical Course User Index [MT] Wyvonnia Dusky, MD    This note was dictated using dragon dictation software.  Please be aware that there may be minor translation errors as a result of this oral dictation   Final Clinical Impressions(s) / ED Diagnoses   Final diagnoses:  Cystitis    ED Discharge Orders         Ordered    cephALEXin (KEFLEX) 500 MG capsule  2 times daily     11/08/19 2036           Wyvonnia Dusky, MD 11/09/19 [phone]

## 2019-11-08 NOTE — ED Notes (Deleted)
Patient verbalizes understanding of discharge instructions. Opportunity for questioning and answers were provided. Armband removed by staff, pt discharged from ED. Pt. ambulatory and discharged home.  

## 2019-11-08 NOTE — ED Triage Notes (Signed)
Comes from [facility] with multiple Covid + patients where they have been sending out by EMS report.  Pt has 3 day hx of lethargy, weakness.  HX of dementia, EMS reports she is at baseline per staff.  Pt denies any pain, cough, nausea or other related sx.  Able to answer questions, is alert without acute distress.  Oriented to person only.  Jovial and cooperative.

## 2019-11-08 NOTE — Discharge Instructions (Addendum)
[name] was diagnosed with a UTI and started on Keflex (her first dose was given today, she will resume tomorrow morning).  Urine culture will be sent for diagnosis.  Her COVID test was negative today.    The rest of her bloodwork is included.  There was no sign of significant clinical dehydration or anemia.  She was stable for discharge.

## 2019-11-08 NOTE — ED Notes (Signed)
Report called to [place] to [place]. [facility] for transport.

## 2019-11-08 NOTE — ED Notes (Signed)
Patient verbalizes understanding of discharge instructions. Opportunity for questioning and answers were provided. Armband removed by staff, pt discharged from ED. [facility] transport back to [facility]

## 2019-11-08 NOTE — ED Notes (Signed)
Please call daughter at [phone] with an update ASAP

## 2019-11-09 LAB — URINE CULTURE: Special Requests: NORMAL

## 2019-11-16 ENCOUNTER — Encounter (HOSPITAL_COMMUNITY): Payer: Self-pay | Admitting: Emergency Medicine

## 2019-11-16 ENCOUNTER — Other Ambulatory Visit: Payer: Self-pay

## 2019-11-16 ENCOUNTER — Emergency Department (HOSPITAL_COMMUNITY): Payer: Medicare Other

## 2019-11-16 ENCOUNTER — Inpatient Hospital Stay (HOSPITAL_COMMUNITY)
Admission: EM | Admit: 2019-11-16 | Discharge: 2019-12-01 | DRG: 065 | Disposition: E | Payer: Medicare Other | Source: Skilled Nursing Facility | Attending: Family Medicine | Admitting: Family Medicine

## 2019-11-16 DIAGNOSIS — Z955 Presence of coronary angioplasty implant and graft: Secondary | ICD-10-CM | POA: Diagnosis not present

## 2019-11-16 DIAGNOSIS — E785 Hyperlipidemia, unspecified: Secondary | ICD-10-CM | POA: Diagnosis not present

## 2019-11-16 DIAGNOSIS — R471 Dysarthria and anarthria: Secondary | ICD-10-CM | POA: Diagnosis present

## 2019-11-16 DIAGNOSIS — R4701 Aphasia: Secondary | ICD-10-CM | POA: Diagnosis not present

## 2019-11-16 DIAGNOSIS — R451 Restlessness and agitation: Secondary | ICD-10-CM | POA: Diagnosis not present

## 2019-11-16 DIAGNOSIS — I639 Cerebral infarction, unspecified: Secondary | ICD-10-CM

## 2019-11-16 DIAGNOSIS — Z8249 Family history of ischemic heart disease and other diseases of the circulatory system: Secondary | ICD-10-CM

## 2019-11-16 DIAGNOSIS — Z66 Do not resuscitate: Secondary | ICD-10-CM | POA: Diagnosis present

## 2019-11-16 DIAGNOSIS — R0682 Tachypnea, not elsewhere classified: Secondary | ICD-10-CM | POA: Diagnosis not present

## 2019-11-16 DIAGNOSIS — I2510 Atherosclerotic heart disease of native coronary artery without angina pectoris: Secondary | ICD-10-CM | POA: Diagnosis present

## 2019-11-16 DIAGNOSIS — I672 Cerebral atherosclerosis: Secondary | ICD-10-CM | POA: Diagnosis present

## 2019-11-16 DIAGNOSIS — Z7902 Long term (current) use of antithrombotics/antiplatelets: Secondary | ICD-10-CM

## 2019-11-16 DIAGNOSIS — Z87440 Personal history of urinary (tract) infections: Secondary | ICD-10-CM

## 2019-11-16 DIAGNOSIS — E872 Acidosis, unspecified: Secondary | ICD-10-CM

## 2019-11-16 DIAGNOSIS — Z20828 Contact with and (suspected) exposure to other viral communicable diseases: Secondary | ICD-10-CM | POA: Diagnosis present

## 2019-11-16 DIAGNOSIS — Z888 Allergy status to other drugs, medicaments and biological substances status: Secondary | ICD-10-CM

## 2019-11-16 DIAGNOSIS — R4182 Altered mental status, unspecified: Secondary | ICD-10-CM

## 2019-11-16 DIAGNOSIS — M810 Age-related osteoporosis without current pathological fracture: Secondary | ICD-10-CM | POA: Diagnosis present

## 2019-11-16 DIAGNOSIS — R233 Spontaneous ecchymoses: Secondary | ICD-10-CM | POA: Diagnosis present

## 2019-11-16 DIAGNOSIS — I63312 Cerebral infarction due to thrombosis of left middle cerebral artery: Principal | ICD-10-CM | POA: Diagnosis present

## 2019-11-16 DIAGNOSIS — F0280 Dementia in other diseases classified elsewhere without behavioral disturbance: Secondary | ICD-10-CM | POA: Diagnosis present

## 2019-11-16 DIAGNOSIS — I10 Essential (primary) hypertension: Secondary | ICD-10-CM | POA: Diagnosis not present

## 2019-11-16 DIAGNOSIS — R29720 NIHSS score 20: Secondary | ICD-10-CM | POA: Diagnosis present

## 2019-11-16 DIAGNOSIS — G309 Alzheimer's disease, unspecified: Secondary | ICD-10-CM | POA: Diagnosis present

## 2019-11-16 DIAGNOSIS — R0600 Dyspnea, unspecified: Secondary | ICD-10-CM

## 2019-11-16 DIAGNOSIS — J9809 Other diseases of bronchus, not elsewhere classified: Secondary | ICD-10-CM | POA: Diagnosis not present

## 2019-11-16 DIAGNOSIS — Z833 Family history of diabetes mellitus: Secondary | ICD-10-CM | POA: Diagnosis not present

## 2019-11-16 DIAGNOSIS — R1310 Dysphagia, unspecified: Secondary | ICD-10-CM | POA: Diagnosis present

## 2019-11-16 DIAGNOSIS — I255 Ischemic cardiomyopathy: Secondary | ICD-10-CM | POA: Diagnosis present

## 2019-11-16 DIAGNOSIS — E876 Hypokalemia: Secondary | ICD-10-CM | POA: Diagnosis present

## 2019-11-16 DIAGNOSIS — R0609 Other forms of dyspnea: Secondary | ICD-10-CM | POA: Diagnosis not present

## 2019-11-16 DIAGNOSIS — Z03818 Encounter for observation for suspected exposure to other biological agents ruled out: Secondary | ICD-10-CM | POA: Diagnosis not present

## 2019-11-16 DIAGNOSIS — E860 Dehydration: Secondary | ICD-10-CM | POA: Diagnosis not present

## 2019-11-16 DIAGNOSIS — R778 Other specified abnormalities of plasma proteins: Secondary | ICD-10-CM | POA: Diagnosis present

## 2019-11-16 DIAGNOSIS — Z79899 Other long term (current) drug therapy: Secondary | ICD-10-CM

## 2019-11-16 DIAGNOSIS — F419 Anxiety disorder, unspecified: Secondary | ICD-10-CM | POA: Diagnosis present

## 2019-11-16 DIAGNOSIS — Z515 Encounter for palliative care: Secondary | ICD-10-CM | POA: Diagnosis not present

## 2019-11-16 DIAGNOSIS — Z743 Need for continuous supervision: Secondary | ICD-10-CM | POA: Diagnosis not present

## 2019-11-16 DIAGNOSIS — G9340 Encephalopathy, unspecified: Secondary | ICD-10-CM | POA: Diagnosis not present

## 2019-11-16 DIAGNOSIS — I739 Peripheral vascular disease, unspecified: Secondary | ICD-10-CM | POA: Diagnosis present

## 2019-11-16 DIAGNOSIS — I081 Rheumatic disorders of both mitral and tricuspid valves: Secondary | ICD-10-CM | POA: Diagnosis not present

## 2019-11-16 DIAGNOSIS — I361 Nonrheumatic tricuspid (valve) insufficiency: Secondary | ICD-10-CM | POA: Diagnosis not present

## 2019-11-16 DIAGNOSIS — F329 Major depressive disorder, single episode, unspecified: Secondary | ICD-10-CM | POA: Diagnosis present

## 2019-11-16 DIAGNOSIS — R29810 Facial weakness: Secondary | ICD-10-CM | POA: Diagnosis not present

## 2019-11-16 DIAGNOSIS — R0902 Hypoxemia: Secondary | ICD-10-CM | POA: Diagnosis not present

## 2019-11-16 LAB — COMPREHENSIVE METABOLIC PANEL
ALT: 22 U/L (ref 0–44)
AST: 32 U/L (ref 15–41)
Albumin: 3.6 g/dL (ref 3.5–5.0)
Alkaline Phosphatase: 71 U/L (ref 38–126)
Anion gap: 13 (ref 5–15)
BUN: 37 mg/dL — ABNORMAL HIGH (ref 8–23)
CO2: 23 mmol/L (ref 22–32)
Calcium: 9.4 mg/dL (ref 8.9–10.3)
Chloride: 110 mmol/L (ref 98–111)
Creatinine, Ser: 1.24 mg/dL — ABNORMAL HIGH (ref 0.44–1.00)
GFR calc Af Amer: 44 mL/min — ABNORMAL LOW (ref >60)
GFR calc non Af Amer: 38 mL/min — ABNORMAL LOW (ref >60)
Glucose, Bld: 153 mg/dL — ABNORMAL HIGH (ref 70–99)
Potassium: 3.3 mmol/L — ABNORMAL LOW (ref 3.5–5.1)
Sodium: 146 mmol/L — ABNORMAL HIGH (ref 135–145)
Total Bilirubin: 0.7 mg/dL (ref 0.3–1.2)
Total Protein: 8.4 g/dL — ABNORMAL HIGH (ref 6.5–8.1)

## 2019-11-16 LAB — CBC WITH DIFFERENTIAL/PLATELET
Abs Immature Granulocytes: 0.07 10*3/uL (ref 0.00–0.07)
Basophils Absolute: 0 10*3/uL (ref 0.0–0.1)
Basophils Relative: 0 %
Eosinophils Absolute: 0 10*3/uL (ref 0.0–0.5)
Eosinophils Relative: 0 %
HCT: 50.4 % — ABNORMAL HIGH (ref 36.0–46.0)
Hemoglobin: 16.1 g/dL — ABNORMAL HIGH (ref 12.0–15.0)
Immature Granulocytes: 1 %
Lymphocytes Relative: 25 %
Lymphs Abs: 1.7 10*3/uL (ref 0.7–4.0)
MCH: 29.5 pg (ref 26.0–34.0)
MCHC: 31.9 g/dL (ref 30.0–36.0)
MCV: 92.3 fL (ref 80.0–100.0)
Monocytes Absolute: 0.9 10*3/uL (ref 0.1–1.0)
Monocytes Relative: 14 %
Neutro Abs: 4.2 10*3/uL (ref 1.7–7.7)
Neutrophils Relative %: 60 %
Platelets: 210 10*3/uL (ref 150–400)
RBC: 5.46 MIL/uL — ABNORMAL HIGH (ref 3.87–5.11)
RDW: 15.6 % — ABNORMAL HIGH (ref 11.5–15.5)
WBC: 6.9 10*3/uL (ref 4.0–10.5)
nRBC: 0 % (ref 0.0–0.2)

## 2019-11-16 LAB — LACTIC ACID, PLASMA: Lactic Acid, Venous: 2.2 mmol/L (ref 0.5–1.9)

## 2019-11-16 LAB — POC SARS CORONAVIRUS 2 AG -  ED: SARS Coronavirus 2 Ag: NEGATIVE

## 2019-11-16 LAB — TROPONIN I (HIGH SENSITIVITY): Troponin I (High Sensitivity): 22 ng/L — ABNORMAL HIGH (ref <18)

## 2019-11-16 MED ORDER — SODIUM CHLORIDE 0.9 % IV BOLUS: 1000.0000 mL | Freq: Once | INTRAVENOUS | Status: DC

## 2019-11-16 NOTE — ED Notes (Signed)
Shirlean Mylar (daughter) [phone]

## 2019-11-16 NOTE — ED Provider Notes (Signed)
[facility] EMERGENCY DEPARTMENT Provider Note   CSN: [id number] Arrival date & time: 12-07-19  2226     History Chief Complaint  Patient presents with  . Altered Mental Status    [name] is a 85 y.o. female.  Level 5 caveat secondary to dementia and altered mental status.  She was here from her facility [facility] 7 days ago for decreased appetite.  She was found to have a UTI and placed on Keflex.  Per EMS staff states that she has not been acting herself for the last few days.  She has not been ambulatory or talking as she usually can do.  No reported fevers vomiting or recent falls.  The history is provided by the EMS personnel and the nursing home.  Altered Mental Status Presenting symptoms: behavior changes and lethargy   Severity:  Moderate Most recent episode:  Yesterday Episode history:  Single Timing:  Constant Progression:  Unchanged Chronicity:  New Context: dementia, nursing home resident, recent change in medication, recent illness and recent infection        Past Medical History:  Diagnosis Date  . ANXIETY   . CORONARY ARTERY DISEASE    a. 11/2000 Cath/PTCA: LM 20d, LAD 99p (PTCA), LCX 70p, RCA dom, 21m, EF 30 dist ant AK;  b. 2008 Myoview: EF 75%, small ant apical scar, mild peri-infarct ischemia.  [name] DEMENTIA   . Dementia (HCC)   . DEPRESSION   . DIVERTICULOSIS, COLON   . HERPES ZOSTER   . History of Ischemic Cardiomyopathy    a. 11/2000 LV gram: EF 30%, dist ant AK;  b. 2008 MV EF 75%.  [name] Hx of echocardiogram    Echo (8/15):  EF 55%, mild apical HK, Gr 1 DD, mild AI, mild MR, severe LAE, mod RAE, mod PI, PASP 38 mmHg  . HYPERLIPIDEMIA   . HYPERTENSION   . OSTEOPOROSIS   . VERTIGO     Patient Active Problem List   Diagnosis Date Noted  . Cough 12/06/2018  . Low back pain 03/13/2015  . NSTEMI (non-ST elevated myocardial infarction) (HCC) 05/26/2014  . Acute URI 03/06/2014  . Pneumonia involving right lung 03/06/2014   . Anogenital lichen sclerosus 11/01/2013  . Well adult exam 05/11/2013  . Chills 04/04/2013  . Chronic low back pain 05/30/2012  . Grief reaction 07/15/2011  . Fatigue 04/21/2011  . Dizzy spells 04/21/2011  . Sinusitis, chronic 04/21/2011  . ALLERGIC RHINITIS 02/16/2011  . Alzheimer's dementia (HCC) 11/01/2010  . Anxiety state 10/28/2010  . ACTINIC KERATOSIS 10/28/2010  . Cystitis 08/22/2009  . HERPES ZOSTER 05/23/2009  . Situational depression 12/05/2008  . VERTIGO 04/16/2008  . Memory loss 04/16/2008  . Dyslipidemia 10/25/2007  . Essential hypertension 10/25/2007  . DIVERTICULOSIS, COLON 10/25/2007  . Coronary atherosclerosis 09/26/2007  . OSTEOPOROSIS 09/26/2007    Past Surgical History:  Procedure Laterality Date  . LEFT HEART CATHETERIZATION WITH CORONARY ANGIOGRAM N/A 05/28/2014   Procedure: LEFT HEART CATHETERIZATION WITH CORONARY ANGIOGRAM;  Surgeon: Kathleene Hazel, MD;  Location: [facility] CATH LAB;  Service: Cardiovascular;  Laterality: N/A;  . PERCUTANEOUS CORONARY STENT INTERVENTION (PCI-S)  05/28/2014   Procedure: PERCUTANEOUS CORONARY STENT INTERVENTION (PCI-S);  Surgeon: Kathleene Hazel, MD;  Location: [facility] CATH LAB;  Service: Cardiovascular;;  . PTCA  12/09/2000   successful; of the proximal left anterior descending with reduction of 99% narrowing to 30% with improvement of TIMI grade 1 to TIMI grade 3 flow  . TONSILLECTOMY AND  ADENOIDECTOMY  1943     OB History    Gravida  2   Para  2   Term      Preterm      AB      Living  2     SAB      TAB      Ectopic      Multiple      Live Births              Family History  Problem Relation Age of Onset  . Diabetes Mother   . Heart attack Mother   . Sudden death Mother   . Hypertension Other   . Heart disease Other   . Coronary artery disease Other   . Hypertension Daughter     Social History   Tobacco Use  . Smoking status: Never Smoker  . Smokeless tobacco: Never Used   Substance Use Topics  . Alcohol use: No  . Drug use: No    Home Medications Prior to Admission medications   Medication Sig Start Date End Date Taking? Authorizing Provider  acetaminophen (TYLENOL) 325 MG tablet Take 2 tablets (650 mg total) by mouth every 4 (four) hours as needed for headache or mild pain. 05/29/14   Erlene Quan, PA-C  amLODipine (NORVASC) 5 MG tablet Take 0.5 tablets (2.5 mg total) by mouth daily. 06/01/18   Plotnikov, Evie Lacks, MD  atorvastatin (LIPITOR) 20 MG tablet Take 0.5 tablets (10 mg total) by mouth daily. 06/01/18   Plotnikov, Evie Lacks, MD  carvedilol (COREG) 12.5 MG tablet TAKE 1 TABLET BY MOUTH TWICE DAILY WITH A MEAL 06/01/18   Plotnikov, Evie Lacks, MD  cephALEXin (KEFLEX) 500 MG capsule Take 1 capsule (500 mg total) by mouth 2 (two) times daily for 7 days. 11/09/19 11/29/2019  Wyvonnia Dusky, MD  Cholecalciferol ([employer] VITAMIN D3) 1000 UNITS capsule Take 1 capsule (1,000 Units total) by mouth daily. 12/19/14   Plotnikov, Evie Lacks, MD  clopidogrel (PLAVIX) 75 MG tablet Take 1 tablet (75 mg total) by mouth daily. 06/01/18   Plotnikov, Evie Lacks, MD  Memantine HCl-Donepezil HCl (NAMZARIC) 28-10 MG CP24 Take 1 capsule by mouth daily. 06/01/18   Plotnikov, Evie Lacks, MD  mirtazapine (REMERON) 15 MG tablet Take 1 tablet (15 mg total) by mouth at bedtime. 06/01/18   Plotnikov, Evie Lacks, MD  nitroGLYCERIN (NITROSTAT) 0.4 MG SL tablet Place 1 tablet (0.4 mg total) under the tongue every 5 (five) minutes as needed for chest pain. 01/05/18   Plotnikov, Evie Lacks, MD  potassium chloride (KLOR-CON) 8 MEQ tablet Take 1 tablet (8 mEq total) by mouth daily. 12/07/18 12/07/19  Plotnikov, Evie Lacks, MD    Allergies    Simvastatin  Review of Systems   Review of Systems  Unable to perform ROS: Dementia    Physical Exam Updated Vital Signs BP (!) 161/58 (BP Location: Right Arm)   Pulse 65   Temp 98.5 F (36.9 C) (Rectal)   Resp (!) 24   SpO2 93%   Physical Exam Vitals and  nursing note reviewed.  Constitutional:      General: She is not in acute distress.    Appearance: She is well-developed.  HENT:     Head: Normocephalic and atraumatic.  Eyes:     Conjunctiva/sclera: Conjunctivae normal.  Cardiovascular:     Rate and Rhythm: Normal rate and regular rhythm.     Pulses: Normal pulses.     Heart sounds: No murmur.  Pulmonary:     Effort: Pulmonary effort is normal. No respiratory distress.     Breath sounds: Normal breath sounds.  Abdominal:     Palpations: Abdomen is soft.     Tenderness: There is no abdominal tenderness.  Musculoskeletal:        General: No deformity or signs of injury. Normal range of motion.     Cervical back: Neck supple.  Skin:    General: Skin is warm and dry.     Capillary Refill: Capillary refill takes less than 2 seconds.  Neurological:     Mental Status: She is alert.     Comments: Patient opens eyes to voice.  She is somewhat following commands.  Her strength seems symmetric bilaterally.  No gross facial droop.  She is not answering questions.     ED Results / Procedures / Treatments   Labs (all labs ordered are listed, but only abnormal results are displayed) Labs Reviewed  COMPREHENSIVE METABOLIC PANEL - Abnormal; Notable for the following components:      Result Value   Sodium 146 (*)    Potassium 3.3 (*)    Glucose, Bld 153 (*)    BUN 37 (*)    Creatinine, Ser 1.24 (*)    Total Protein 8.4 (*)    GFR calc non Af Amer 38 (*)    GFR calc Af Amer 44 (*)    All other components within normal limits  LACTIC ACID, PLASMA - Abnormal; Notable for the following components:   Lactic Acid, Venous 2.2 (*)    All other components within normal limits  CBC WITH DIFFERENTIAL/PLATELET - Abnormal; Notable for the following components:   RBC 5.46 (*)    Hemoglobin 16.1 (*)    HCT 50.4 (*)    RDW 15.6 (*)    All other components within normal limits  URINALYSIS, ROUTINE W REFLEX MICROSCOPIC - Abnormal; Notable for the  following components:   APPearance HAZY (*)    Ketones, ur 5 (*)    Protein, ur 100 (*)    All other components within normal limits  HEMOGLOBIN A1C - Abnormal; Notable for the following components:   Hgb A1c MFr Bld 6.9 (*)    All other components within normal limits  LIPID PANEL - Abnormal; Notable for the following components:   Triglycerides 272 (*)    HDL 26 (*)    VLDL 54 (*)    All other components within normal limits  MAGNESIUM - Abnormal; Notable for the following components:   Magnesium 2.6 (*)    All other components within normal limits  BASIC METABOLIC PANEL - Abnormal; Notable for the following components:   Sodium 147 (*)    Potassium 3.3 (*)    Chloride 113 (*)    CO2 21 (*)    Glucose, Bld 128 (*)    BUN 31 (*)    Creatinine, Ser 1.07 (*)    Calcium 8.8 (*)    GFR calc non Af Amer 46 (*)    GFR calc Af Amer 53 (*)    All other components within normal limits  TROPONIN I (HIGH SENSITIVITY) - Abnormal; Notable for the following components:   Troponin I (High Sensitivity) 22 (*)    All other components within normal limits  SARS CORONAVIRUS 2 (TAT 6-24 HRS)  CULTURE, BLOOD (ROUTINE X 2)  URINE CULTURE  ETHANOL  PROTIME-INR  APTT  RAPID URINE DRUG SCREEN, HOSP PERFORMED  LACTIC ACID, PLASMA  POC SARS CORONAVIRUS  2 AG -  ED  TROPONIN I (HIGH SENSITIVITY)    EKG EKG Interpretation  Date/Time:  Thursday November 16 2019 22:29:03 EST Ventricular Rate:  64 PR Interval:    QRS Duration: 101 QT Interval:  416 QTC Calculation: 430 R Axis:   42 Text Interpretation: Sinus rhythm No significant change since prior 12/20 Confirmed by Meridee Score [phone]) on 11/10/2019 10:40:51 PM   Radiology DG Abdomen 1 View  Result Date: 11/17/2019 CLINICAL DATA:  Initial evaluation for MRI clearance. EXAM: ABDOMEN - 1 VIEW COMPARISON:  None. FINDINGS: Bowel gas pattern within normal limits without obstruction or ileus. No abnormal bowel wall thickening. No soft  tissue mass or abnormal calcification. No metallic implant or radiopaque foreign body. Dextroscoliosis with advanced multilevel degenerative spondylosis noted within the lumbar spine. IMPRESSION: 1. No metallic implant or other contraindication for MRI within the abdomen. 2. Nonobstructive bowel gas pattern. Electronically Signed   By: Rise Mu M.D.   On: 11/17/2019 05:25   CT Head Wo Contrast  Result Date: 11/17/2019 CLINICAL DATA:  Encephalopathy. EXAM: CT HEAD WITHOUT CONTRAST TECHNIQUE: Contiguous axial images were obtained from the base of the skull through the vertex without intravenous contrast. COMPARISON:  December 02, 2017 FINDINGS: Brain: There is loss of gray-white differentiation involving approximately 5 cm area involving the left frontal lobe. There is no midline shift. No acute intracranial hemorrhage. Moderate atrophy and chronic microvascular ischemic changes are noted. Vascular: The left M2 is asymmetrically dense concerning for thrombus. Skull: Normal. Negative for fracture or focal lesion. Sinuses/Orbits: No acute finding. Other: None. IMPRESSION: 1. Findings concerning for an acute or subacute infarct involving the left frontal lobe as detailed above. 2. Asymmetric a dense left M2 concerning for thrombus. 3. No acute intracranial hemorrhage. 4. Moderate atrophy and chronic microvascular ischemic changes. These results were called by telephone at the time of interpretation on 11/17/2019 at 12:37 am to provider [facility] , who verbally acknowledged these results. Electronically Signed   By: Katherine Mantle M.D.   On: 11/17/2019 00:42   MR ANGIO HEAD WO CONTRAST  Result Date: 11/17/2019 CLINICAL DATA:  Stroke, follow-up. Additional history provided: Altered mental status. EXAM: MRI HEAD WITHOUT CONTRAST MRA HEAD WITHOUT CONTRAST TECHNIQUE: Multiplanar, multiecho pulse sequences of the brain and surrounding structures were obtained without intravenous contrast.  Angiographic images of the head were obtained using MRA technique without contrast. COMPARISON:  Head CT 11/17/2019 FINDINGS: MRI HEAD FINDINGS Brain: Multiple sequences are motion degraded. There is a region of cortical/subcortical restricted diffusion within the anterolateral left frontal lobes measuring 5.5 x 3.4 cm in transaxial dimensions, consistent with acute/early subacute infarct. The infarct also extends to involve portions of the lateral left basal ganglia and anterior left insula. Associated T2/FLAIR hyperintensity at the infarct site. There is also SWI signal loss throughout the infarct territory consistent with petechial hemorrhage. Regional mass effect with minimal effacement of the left lateral ventricle frontal horn. No midline shift or extra-axial fluid collection. Background of moderate chronic small vessel ischemic disease. Chronic lacunar infarct within the right cerebellum. Mild generalized parenchymal atrophy. Vascular: Reported separately Skull and upper cervical spine: No focal marrow lesion. Sinuses/Orbits: Visualized orbits demonstrate no acute abnormality. Mild ethmoid sinus mucosal thickening. Small bilateral mastoid effusions. MRA HEAD FINDINGS The intracranial internal carotid arteries are patent. Mild atherosclerotic irregularity of these vessels without significant stenosis. The right middle cerebral artery is patent without significant proximal stenosis. The right anterior cerebral artery is patent. Mild-to-moderate focal stenosis at the  origin of the right anterior cerebral artery. The M1 left middle cerebral artery is patent without significant stenosis. High-grade focal stenosis within a superior division proximal M2 left middle cerebral artery branch shortly beyond its origin (series 1018, image 13). There is also segmental near occlusive or occlusive stenosis within a proximal left M2 branch more distally. The left anterior cerebral artery is patent without significant proximal  stenosis. The intracranial vertebral arteries are patent without significant stenosis. The left vertebral artery is developmentally diminutive beyond the origin of the left PICA. The basilar artery is patent without significant stenosis. Predominantly fetal origin of the bilateral posterior cerebral arteries, which are patent without significant proximal stenosis. No intracranial aneurysm is identified. IMPRESSION: MRI brain: 1. Acute/early subacute infarct involving the anterolateral left frontal lobe, lateral left basal ganglia and anterior left insula with associated petechial hemorrhage, as described. 2. Mild associated mass effect with minimal effacement of the left lateral ventricle frontal horn. 3. Generalized parenchymal atrophy with moderate chronic small vessel ischemic disease. Chronic right cerebellar lacunar infarct. MRA head: 1. High-grade focal stenosis within a superior division proximal M2 left MCA branch shortly beyond its origin. There is also segmental near occlusive or occlusive stenosis within a proximal left M2 branch more distally. 2. Moderate focal stenosis at the origin of the right anterior right anterior cerebral artery. 3. Additional intracranial atherosclerotic disease as described with no other significant proximal arterial stenosis identified. Electronically Signed   By: Jackey Loge DO   On: 11/17/2019 08:19   MR Angiogram Neck W or Wo Contrast  Result Date: 11/17/2019 CLINICAL DATA:  Stroke, follow-up. EXAM: MRA NECK WITHOUT AND WITH CONTRAST TECHNIQUE: Multiplanar and multiecho pulse sequences of the neck were obtained without and with intravenous contrast. Angiographic images of the neck were obtained using MRA technique without and with intravenous contrast. CONTRAST:  7mL GADAVIST GADOBUTROL 1 MMOL/ML IV SOLN COMPARISON:  Same-day MRI/MRA head 11/17/2019 FINDINGS: Standard aortic branching. The visualized aortic arch and proximal major branch vessels are unremarkable. The  bilateral common and internal carotid arteries are patent within the neck without significant stenosis. The vertebral arteries are codominant and patent within the neck without significant stenosis. IMPRESSION: The common carotid, internal carotid and vertebral arteries are patent within the neck without significant stenosis. Electronically Signed   By: Jackey Loge DO   On: 11/17/2019 08:25   MR BRAIN WO CONTRAST  Result Date: 11/17/2019 CLINICAL DATA:  Stroke, follow-up. Additional history provided: Altered mental status. EXAM: MRI HEAD WITHOUT CONTRAST MRA HEAD WITHOUT CONTRAST TECHNIQUE: Multiplanar, multiecho pulse sequences of the brain and surrounding structures were obtained without intravenous contrast. Angiographic images of the head were obtained using MRA technique without contrast. COMPARISON:  Head CT 11/17/2019 FINDINGS: MRI HEAD FINDINGS Brain: Multiple sequences are motion degraded. There is a region of cortical/subcortical restricted diffusion within the anterolateral left frontal lobes measuring 5.5 x 3.4 cm in transaxial dimensions, consistent with acute/early subacute infarct. The infarct also extends to involve portions of the lateral left basal ganglia and anterior left insula. Associated T2/FLAIR hyperintensity at the infarct site. There is also SWI signal loss throughout the infarct territory consistent with petechial hemorrhage. Regional mass effect with minimal effacement of the left lateral ventricle frontal horn. No midline shift or extra-axial fluid collection. Background of moderate chronic small vessel ischemic disease. Chronic lacunar infarct within the right cerebellum. Mild generalized parenchymal atrophy. Vascular: Reported separately Skull and upper cervical spine: No focal marrow lesion. Sinuses/Orbits: Visualized orbits demonstrate no acute  abnormality. Mild ethmoid sinus mucosal thickening. Small bilateral mastoid effusions. MRA HEAD FINDINGS The intracranial internal  carotid arteries are patent. Mild atherosclerotic irregularity of these vessels without significant stenosis. The right middle cerebral artery is patent without significant proximal stenosis. The right anterior cerebral artery is patent. Mild-to-moderate focal stenosis at the origin of the right anterior cerebral artery. The M1 left middle cerebral artery is patent without significant stenosis. High-grade focal stenosis within a superior division proximal M2 left middle cerebral artery branch shortly beyond its origin (series 1018, image 13). There is also segmental near occlusive or occlusive stenosis within a proximal left M2 branch more distally. The left anterior cerebral artery is patent without significant proximal stenosis. The intracranial vertebral arteries are patent without significant stenosis. The left vertebral artery is developmentally diminutive beyond the origin of the left PICA. The basilar artery is patent without significant stenosis. Predominantly fetal origin of the bilateral posterior cerebral arteries, which are patent without significant proximal stenosis. No intracranial aneurysm is identified. IMPRESSION: MRI brain: 1. Acute/early subacute infarct involving the anterolateral left frontal lobe, lateral left basal ganglia and anterior left insula with associated petechial hemorrhage, as described. 2. Mild associated mass effect with minimal effacement of the left lateral ventricle frontal horn. 3. Generalized parenchymal atrophy with moderate chronic small vessel ischemic disease. Chronic right cerebellar lacunar infarct. MRA head: 1. High-grade focal stenosis within a superior division proximal M2 left MCA branch shortly beyond its origin. There is also segmental near occlusive or occlusive stenosis within a proximal left M2 branch more distally. 2. Moderate focal stenosis at the origin of the right anterior right anterior cerebral artery. 3. Additional intracranial atherosclerotic disease  as described with no other significant proximal arterial stenosis identified. Electronically Signed   By: Jackey Loge DO   On: 11/17/2019 08:19   DG Chest Port 1 View  Result Date: 11-27-2019 CLINICAL DATA:  85 year old with weakness and altered mental status. EXAM: PORTABLE CHEST 1 VIEW COMPARISON:  Radiograph 11/08/2019 FINDINGS: Low lung volumes. Borderline cardiomegaly unchanged from prior exam. Unchanged mediastinal contours. Coarse interstitial markings particularly at the lung bases, not significantly changed. No new airspace disease. No pleural fluid or pneumothorax. IMPRESSION: No acute findings.Unchanged coarse interstitial markings at the lung bases appear chronic. Electronically Signed   By: Narda Rutherford M.D.   On: 11-27-2019 23:17    Procedures .Critical Care Performed by: Terrilee Files, MD Authorized by: Terrilee Files, MD   Critical care provider statement:    Critical care time (minutes):  45   Critical care was necessary to treat or prevent imminent or life-threatening deterioration of the following conditions:  CNS failure or compromise   Critical care was time spent personally by me on the following activities:  Evaluation of patient's response to treatment, examination of patient, ordering and performing treatments and interventions, ordering and review of laboratory studies, ordering and review of radiographic studies, pulse oximetry, re-evaluation of patient's condition, obtaining history from patient or surrogate and review of old charts   I assumed direction of critical care for this patient from another provider in my specialty: no     (including critical care time)  Medications Ordered in ED Medications - No data to display  ED Course  I have reviewed the triage vital signs and the nursing notes.  Pertinent labs & imaging results that were available during my care of the patient were reviewed by me and considered in my medical decision making (see  chart for details).  Clinical Course as of Nov 16 1041  Thu December 09, 2019  6980 85 year old female brought in by her facility for change in behavior in the setting of a probable recent urinary tract infection.  Differential diagnosis includes sepsis, stroke, metabolic derangement, encephalopathy, dehydration   [MB]  2253 Chest x-ray interpreted by me as no gross infiltrates.  Awaiting radiology reading.   [MB]  2322 Patient signed out to oncoming attending Dr Bebe Shaggy to followup on testing and appropriate disposition.    [MB]    Clinical Course User Index [MB] Terrilee Files, MD   MDM Rules/Calculators/A&P                     [name] was evaluated in Emergency Department on 12/09/2019 for the symptoms described in the history of present illness. She was evaluated in the context of the global COVID-19 pandemic, which necessitated consideration that the patient might be at risk for infection with the SARS-CoV-2 virus that causes COVID-19. Institutional protocols and algorithms that pertain to the evaluation of patients at risk for COVID-19 are in a state of rapid change based on information released by regulatory bodies including the CDC and federal and state organizations. These policies and algorithms were followed during the patient's care in the ED.   Final Clinical Impression(s) / ED Diagnoses Final diagnoses:  Altered mental status, unspecified altered mental status type  Cerebrovascular accident (CVA), unspecified mechanism (HCC)    Rx / DC Orders ED Discharge Orders    None       Terrilee Files, MD 11/17/19 1044

## 2019-11-16 NOTE — ED Triage Notes (Addendum)
Pt to ED via [facility] from [facility].  Staff reports pt has not been acting "like herself" for 1.5 days.  Pt is normally ambulatory but will not walk or talk at this time.  Pt was recently being treated for UTI and just finished taking Keflex

## 2019-11-17 ENCOUNTER — Inpatient Hospital Stay (HOSPITAL_COMMUNITY): Payer: Medicare Other

## 2019-11-17 ENCOUNTER — Emergency Department (HOSPITAL_COMMUNITY): Payer: Medicare Other

## 2019-11-17 DIAGNOSIS — G9340 Encephalopathy, unspecified: Secondary | ICD-10-CM | POA: Diagnosis not present

## 2019-11-17 DIAGNOSIS — I10 Essential (primary) hypertension: Secondary | ICD-10-CM | POA: Diagnosis present

## 2019-11-17 DIAGNOSIS — Z955 Presence of coronary angioplasty implant and graft: Secondary | ICD-10-CM | POA: Diagnosis not present

## 2019-11-17 DIAGNOSIS — E785 Hyperlipidemia, unspecified: Secondary | ICD-10-CM | POA: Diagnosis present

## 2019-11-17 DIAGNOSIS — I255 Ischemic cardiomyopathy: Secondary | ICD-10-CM | POA: Diagnosis present

## 2019-11-17 DIAGNOSIS — I361 Nonrheumatic tricuspid (valve) insufficiency: Secondary | ICD-10-CM

## 2019-11-17 DIAGNOSIS — Z888 Allergy status to other drugs, medicaments and biological substances status: Secondary | ICD-10-CM | POA: Diagnosis not present

## 2019-11-17 DIAGNOSIS — E872 Acidosis, unspecified: Secondary | ICD-10-CM

## 2019-11-17 DIAGNOSIS — Z01818 Encounter for other preprocedural examination: Secondary | ICD-10-CM | POA: Diagnosis not present

## 2019-11-17 DIAGNOSIS — I639 Cerebral infarction, unspecified: Secondary | ICD-10-CM | POA: Diagnosis present

## 2019-11-17 DIAGNOSIS — I081 Rheumatic disorders of both mitral and tricuspid valves: Secondary | ICD-10-CM | POA: Diagnosis present

## 2019-11-17 DIAGNOSIS — Z515 Encounter for palliative care: Secondary | ICD-10-CM | POA: Diagnosis not present

## 2019-11-17 DIAGNOSIS — R0682 Tachypnea, not elsewhere classified: Secondary | ICD-10-CM | POA: Diagnosis not present

## 2019-11-17 DIAGNOSIS — R778 Other specified abnormalities of plasma proteins: Secondary | ICD-10-CM

## 2019-11-17 DIAGNOSIS — R471 Dysarthria and anarthria: Secondary | ICD-10-CM | POA: Diagnosis present

## 2019-11-17 DIAGNOSIS — Z66 Do not resuscitate: Secondary | ICD-10-CM | POA: Diagnosis not present

## 2019-11-17 DIAGNOSIS — M810 Age-related osteoporosis without current pathological fracture: Secondary | ICD-10-CM | POA: Diagnosis present

## 2019-11-17 DIAGNOSIS — Z8249 Family history of ischemic heart disease and other diseases of the circulatory system: Secondary | ICD-10-CM | POA: Diagnosis not present

## 2019-11-17 DIAGNOSIS — Z833 Family history of diabetes mellitus: Secondary | ICD-10-CM | POA: Diagnosis not present

## 2019-11-17 DIAGNOSIS — I63312 Cerebral infarction due to thrombosis of left middle cerebral artery: Secondary | ICD-10-CM | POA: Diagnosis present

## 2019-11-17 DIAGNOSIS — R29720 NIHSS score 20: Secondary | ICD-10-CM | POA: Diagnosis present

## 2019-11-17 DIAGNOSIS — F329 Major depressive disorder, single episode, unspecified: Secondary | ICD-10-CM | POA: Diagnosis present

## 2019-11-17 DIAGNOSIS — E860 Dehydration: Secondary | ICD-10-CM | POA: Diagnosis present

## 2019-11-17 DIAGNOSIS — Z20828 Contact with and (suspected) exposure to other viral communicable diseases: Secondary | ICD-10-CM | POA: Diagnosis present

## 2019-11-17 DIAGNOSIS — R451 Restlessness and agitation: Secondary | ICD-10-CM | POA: Diagnosis not present

## 2019-11-17 DIAGNOSIS — I2510 Atherosclerotic heart disease of native coronary artery without angina pectoris: Secondary | ICD-10-CM | POA: Diagnosis present

## 2019-11-17 DIAGNOSIS — R29810 Facial weakness: Secondary | ICD-10-CM | POA: Diagnosis present

## 2019-11-17 DIAGNOSIS — R1310 Dysphagia, unspecified: Secondary | ICD-10-CM | POA: Diagnosis present

## 2019-11-17 DIAGNOSIS — E876 Hypokalemia: Secondary | ICD-10-CM

## 2019-11-17 DIAGNOSIS — R140 Abdominal distension (gaseous): Secondary | ICD-10-CM | POA: Diagnosis not present

## 2019-11-17 DIAGNOSIS — R4701 Aphasia: Secondary | ICD-10-CM | POA: Diagnosis present

## 2019-11-17 DIAGNOSIS — F419 Anxiety disorder, unspecified: Secondary | ICD-10-CM | POA: Diagnosis present

## 2019-11-17 DIAGNOSIS — R0609 Other forms of dyspnea: Secondary | ICD-10-CM | POA: Diagnosis not present

## 2019-11-17 LAB — HEMOGLOBIN A1C
Hgb A1c MFr Bld: 6.9 % — ABNORMAL HIGH (ref 4.8–5.6)
Mean Plasma Glucose: 151.33 mg/dL

## 2019-11-17 LAB — BASIC METABOLIC PANEL
Anion gap: 13 (ref 5–15)
Anion gap: 14 (ref 5–15)
BUN: 31 mg/dL — ABNORMAL HIGH (ref 8–23)
BUN: 17 mg/dL (ref 8–23)
CO2: 21 mmol/L — ABNORMAL LOW (ref 22–32)
CO2: 16 mmol/L — ABNORMAL LOW (ref 22–32)
Calcium: 8.8 mg/dL — ABNORMAL LOW (ref 8.9–10.3)
Calcium: 7 mg/dL — ABNORMAL LOW (ref 8.9–10.3)
Chloride: 113 mmol/L — ABNORMAL HIGH (ref 98–111)
Chloride: 117 mmol/L — ABNORMAL HIGH (ref 98–111)
Creatinine, Ser: 1.07 mg/dL — ABNORMAL HIGH (ref 0.44–1.00)
Creatinine, Ser: 0.73 mg/dL (ref 0.44–1.00)
GFR calc Af Amer: 53 mL/min — ABNORMAL LOW (ref >60)
GFR calc Af Amer: >60 mL/min (ref >60)
GFR calc non Af Amer: 46 mL/min — ABNORMAL LOW (ref >60)
GFR calc non Af Amer: >60 mL/min (ref >60)
Glucose, Bld: 128 mg/dL — ABNORMAL HIGH (ref 70–99)
Glucose, Bld: 117 mg/dL — ABNORMAL HIGH (ref 70–99)
Potassium: 3.3 mmol/L — ABNORMAL LOW (ref 3.5–5.1)
Potassium: 2.8 mmol/L — ABNORMAL LOW (ref 3.5–5.1)
Sodium: 147 mmol/L — ABNORMAL HIGH (ref 135–145)
Sodium: 147 mmol/L — ABNORMAL HIGH (ref 135–145)

## 2019-11-17 LAB — URINE CULTURE: Culture: NO GROWTH

## 2019-11-17 LAB — URINALYSIS, ROUTINE W REFLEX MICROSCOPIC
Bacteria, UA: NONE SEEN
Bilirubin Urine: NEGATIVE
Glucose, UA: NEGATIVE mg/dL
Hgb urine dipstick: NEGATIVE
Ketones, ur: 5 mg/dL — AB
Leukocytes,Ua: NEGATIVE
Nitrite: NEGATIVE
Protein, ur: 100 mg/dL — AB
Specific Gravity, Urine: 1.023 (ref 1.005–1.030)
pH: 5 (ref 5.0–8.0)

## 2019-11-17 LAB — TROPONIN I (HIGH SENSITIVITY): Troponin I (High Sensitivity): 17 ng/L (ref <18)

## 2019-11-17 LAB — PROTIME-INR
INR: 1 (ref 0.8–1.2)
Prothrombin Time: 13.1 seconds (ref 11.4–15.2)

## 2019-11-17 LAB — MAGNESIUM: Magnesium: 2.6 mg/dL — ABNORMAL HIGH (ref 1.7–2.4)

## 2019-11-17 LAB — LIPID PANEL
Cholesterol: 139 mg/dL (ref 0–200)
HDL: 26 mg/dL — ABNORMAL LOW (ref >40)
LDL Cholesterol: 59 mg/dL (ref 0–99)
Total CHOL/HDL Ratio: 5.3 RATIO
Triglycerides: 272 mg/dL — ABNORMAL HIGH (ref <150)
VLDL: 54 mg/dL — ABNORMAL HIGH (ref 0–40)

## 2019-11-17 LAB — RAPID URINE DRUG SCREEN, HOSP PERFORMED
Amphetamines: NOT DETECTED
Barbiturates: NOT DETECTED
Benzodiazepines: NOT DETECTED
Cocaine: NOT DETECTED
Opiates: NOT DETECTED
Tetrahydrocannabinol: NOT DETECTED

## 2019-11-17 LAB — APTT: aPTT: 30 seconds (ref 24–36)

## 2019-11-17 LAB — SARS CORONAVIRUS 2 (TAT 6-24 HRS): SARS Coronavirus 2: NEGATIVE

## 2019-11-17 LAB — LACTIC ACID, PLASMA: Lactic Acid, Venous: 1.6 mmol/L (ref 0.5–1.9)

## 2019-11-17 LAB — ETHANOL: Alcohol, Ethyl (B): <10 mg/dL (ref <10)

## 2019-11-17 LAB — ECHOCARDIOGRAM COMPLETE

## 2019-11-17 MED ORDER — ASPIRIN 325 MG PO TABS
325.0000 mg | ORAL_TABLET | Freq: Every day | ORAL | Status: DC
  Administered 2019-11-19: 325 mg via ORAL
  Filled 2019-11-17: qty 1

## 2019-11-17 MED ORDER — STROKE: EARLY STAGES OF RECOVERY BOOK: Freq: Once | Status: DC

## 2019-11-17 MED ORDER — SODIUM CHLORIDE 0.9 % IV SOLN: INTRAVENOUS | Status: DC

## 2019-11-17 MED ORDER — ACETAMINOPHEN 325 MG PO TABS: 650.0000 mg | ORAL_TABLET | ORAL | Status: DC | PRN

## 2019-11-17 MED ORDER — ASPIRIN 300 MG RE SUPP
300.0000 mg | Freq: Every day | RECTAL | Status: DC
  Administered 2019-11-17 – 2019-11-18 (×2): 300 mg via RECTAL
  Filled 2019-11-17 (×2): qty 1

## 2019-11-17 MED ORDER — POTASSIUM CHLORIDE 10 MEQ/100ML IV SOLN
10.0000 meq | INTRAVENOUS | Status: AC
  Administered 2019-11-17 (×2): 10 meq via INTRAVENOUS
  Filled 2019-11-17 (×2): qty 100

## 2019-11-17 MED ORDER — HYDRALAZINE HCL 20 MG/ML IJ SOLN
5.0000 mg | INTRAMUSCULAR | Status: DC | PRN
  Administered 2019-11-17 – 2019-11-20 (×5): 5 mg via INTRAVENOUS
  Filled 2019-11-17 (×5): qty 1

## 2019-11-17 MED ORDER — ENOXAPARIN SODIUM 40 MG/0.4ML ~~LOC~~ SOLN
40.0000 mg | Freq: Every day | SUBCUTANEOUS | Status: DC
  Administered 2019-11-17 – 2019-11-21 (×5): 40 mg via SUBCUTANEOUS
  Filled 2019-11-17 (×5): qty 0.4

## 2019-11-17 MED ORDER — ACETAMINOPHEN 160 MG/5ML PO SOLN: 650.0000 mg | ORAL | Status: DC | PRN

## 2019-11-17 MED ORDER — GADOBUTROL 1 MMOL/ML IV SOLN
7.0000 mL | Freq: Once | INTRAVENOUS | Status: AC | PRN
  Administered 2019-11-17: 7 mL via INTRAVENOUS

## 2019-11-17 MED ORDER — SODIUM CHLORIDE 0.9 % IV BOLUS
500.0000 mL | Freq: Once | INTRAVENOUS | Status: AC
  Administered 2019-11-17: 500 mL via INTRAVENOUS

## 2019-11-17 MED ORDER — ACETAMINOPHEN 650 MG RE SUPP
650.0000 mg | RECTAL | Status: DC | PRN
  Administered 2019-11-22: 650 mg via RECTAL
  Filled 2019-11-17: qty 1

## 2019-11-17 MED ORDER — SENNOSIDES-DOCUSATE SODIUM 8.6-50 MG PO TABS: 1.0000 | ORAL_TABLET | Freq: Every evening | ORAL | Status: DC | PRN

## 2019-11-17 NOTE — ED Notes (Signed)
Patient incontinent of urine.  Patient cleaned at this time and in and out cathed for urine.

## 2019-11-17 NOTE — Evaluation (Signed)
Speech Language Pathology Evaluation Patient Details Name: [name] MRN: [id number] DOB: [birth year] Today's Date: 11/17/2019 Time: 4401-0272 SLP Time Calculation (min) (ACUTE ONLY): 15 min  Problem List:  Patient Active Problem List   Diagnosis Date Noted  . Acute CVA (cerebrovascular accident) (HCC) 11/17/2019  . Lactic acidosis 11/17/2019  . Elevated troponin 11/17/2019  . Hypokalemia 11/17/2019  . Cough 12/06/2018  . Low back pain 03/13/2015  . NSTEMI (non-ST elevated myocardial infarction) (HCC) 05/26/2014  . Acute URI 03/06/2014  . Pneumonia involving right lung 03/06/2014  . Anogenital lichen sclerosus 11/01/2013  . Well adult exam 05/11/2013  . Chills 04/04/2013  . Chronic low back pain 05/30/2012  . Grief reaction 07/15/2011  . Fatigue 04/21/2011  . Dizzy spells 04/21/2011  . Sinusitis, chronic 04/21/2011  . ALLERGIC RHINITIS 02/16/2011  . Alzheimer's dementia (HCC) 11/01/2010  . Anxiety state 10/28/2010  . ACTINIC KERATOSIS 10/28/2010  . Cystitis 08/22/2009  . HERPES ZOSTER 05/23/2009  . Situational depression 12/05/2008  . VERTIGO 04/16/2008  . Memory loss 04/16/2008  . Dyslipidemia 10/25/2007  . Hypertension 10/25/2007  . DIVERTICULOSIS, COLON 10/25/2007  . Coronary atherosclerosis 09/26/2007  . OSTEOPOROSIS 09/26/2007   Past Medical History:  Past Medical History:  Diagnosis Date  . ANXIETY   . CORONARY ARTERY DISEASE    a. 11/2000 Cath/PTCA: LM 20d, LAD 99p (PTCA), LCX 70p, RCA dom, 35m, EF 30 dist ant AK;  b. 2008 Myoview: EF 75%, small ant apical scar, mild peri-infarct ischemia.  [name] DEMENTIA   . Dementia (HCC)   . DEPRESSION   . DIVERTICULOSIS, COLON   . HERPES ZOSTER   . History of Ischemic Cardiomyopathy    a. 11/2000 LV gram: EF 30%, dist ant AK;  b. 2008 MV EF 75%.  [name] Hx of echocardiogram    Echo (8/15):  EF 55%, mild apical HK, Gr 1 DD, mild AI, mild MR, severe LAE, mod RAE, mod PI, PASP 38 mmHg  . HYPERLIPIDEMIA   . HYPERTENSION   .  OSTEOPOROSIS   . VERTIGO    Past Surgical History:  Past Surgical History:  Procedure Laterality Date  . LEFT HEART CATHETERIZATION WITH CORONARY ANGIOGRAM N/A 05/28/2014   Procedure: LEFT HEART CATHETERIZATION WITH CORONARY ANGIOGRAM;  Surgeon: Kathleene Hazel, MD;  Location: [facility] CATH LAB;  Service: Cardiovascular;  Laterality: N/A;  . PERCUTANEOUS CORONARY STENT INTERVENTION (PCI-S)  05/28/2014   Procedure: PERCUTANEOUS CORONARY STENT INTERVENTION (PCI-S);  Surgeon: Kathleene Hazel, MD;  Location: [facility] CATH LAB;  Service: Cardiovascular;;  . PTCA  12/09/2000   successful; of the proximal left anterior descending with reduction of 99% narrowing to 30% with improvement of TIMI grade 1 to TIMI grade 3 flow  . TONSILLECTOMY AND ADENOIDECTOMY  1943   HPI:  Pt is a 85 yo female presenting with AMS, found to have acute/early subacute infarct involving the anterolateral left frontal lobe, lateral left basal ganglia and anterior left insula with associated petechial hemorrhage. PMH includes: dementia, CAD, anxiety, depression, vertigo, osteoporosis, HTN, HLD   Assessment / Plan / Recommendation Clinical Impression  Pt presents with a global aphasia, with no verbal output or attempts at using gestures for expressive communication, although she smiles often in response to SLP. She does not reply to yes/no question or follow one-step commands, but she will participate in simple, functional tasks when demonstrated for her. Automatic speech tasks did not elicit any response and her voice was not heard throughout evaluation. Pt will need SLP f/u to maximize  functional communication and safety.     SLP Assessment  SLP Recommendation/Assessment: Patient needs continued Speech Lanaguage Pathology Services SLP Visit Diagnosis: Aphasia (R47.01)    Follow Up Recommendations  Skilled Nursing facility    Frequency and Duration min 2x/week  2 weeks      SLP Evaluation Cognition  Overall  Cognitive Status: Difficult to assess Arousal/Alertness: Awake/alert Attention: Sustained Sustained Attention: Impaired Sustained Attention Impairment: Functional basic Problem Solving: Impaired Problem Solving Impairment: Functional basic       Comprehension  Auditory Comprehension Overall Auditory Comprehension: Impaired Yes/No Questions: Impaired Basic Biographical Questions: 0-25% accurate Commands: Impaired One Step Basic Commands: 0-24% accurate    Expression Expression Primary Mode of Expression: Verbal Verbal Expression Overall Verbal Expression: Impaired Initiation: Impaired Automatic Speech: (none) Level of Generative/Spontaneous Verbalization: (none) Repetition: Impaired Level of Impairment: Word level   Oral / Motor  Oral Motor/Sensory Function Overall Oral Motor/Sensory Function: (not following commands) Motor Speech Overall Motor Speech: Other (comment)(UTA)   GO                    Talbert Nan 11/17/2019, 4:33 PM  Pollyann Glen, M.A. [place] Acute Environmental education officer [phone] Office [phone]

## 2019-11-17 NOTE — Progress Notes (Signed)
  Echocardiogram 2D Echocardiogram has been performed.  Khadija Thier M 11/17/2019, 10:47 AM 

## 2019-11-17 NOTE — Evaluation (Signed)
Clinical/Bedside Swallow Evaluation Patient Details  Name: [name]: [id number] Date of Birth: 05-Apr-1927  Today's Date: 11/17/2019 Time: SLP Start Time (ACUTE ONLY): 3151 SLP Stop Time (ACUTE ONLY): 1530 SLP Time Calculation (min) (ACUTE ONLY): 13 min  Past Medical History:  Past Medical History:  Diagnosis Date  . ANXIETY   . CORONARY ARTERY DISEASE    a. 11/2000 Cath/PTCA: LM 20d, LAD 99p (PTCA), LCX 70p, RCA dom, 31m, EF 30 dist ant AK;  b. 2008 Myoview: EF 75%, small ant apical scar, mild peri-infarct ischemia.  [name] DEMENTIA   . Dementia ([place])   . DEPRESSION   . DIVERTICULOSIS, COLON   . HERPES ZOSTER   . History of Ischemic Cardiomyopathy    a. 11/2000 LV gram: EF 30%, dist ant AK;  b. 2008 MV EF 75%.  [name] Hx of echocardiogram    Echo (8/15):  EF 55%, mild apical HK, Gr 1 DD, mild AI, mild MR, severe LAE, mod RAE, mod PI, PASP 38 mmHg  . HYPERLIPIDEMIA   . HYPERTENSION   . OSTEOPOROSIS   . VERTIGO    Past Surgical History:  Past Surgical History:  Procedure Laterality Date  . LEFT HEART CATHETERIZATION WITH CORONARY ANGIOGRAM N/A 05/28/2014   Procedure: LEFT HEART CATHETERIZATION WITH CORONARY ANGIOGRAM;  Surgeon: Eileen Blanks, MD;  Location: [facility] CATH LAB;  Service: Cardiovascular;  Laterality: N/A;  . PERCUTANEOUS CORONARY STENT INTERVENTION (PCI-S)  05/28/2014   Procedure: PERCUTANEOUS CORONARY STENT INTERVENTION (PCI-S);  Surgeon: Eileen Blanks, MD;  Location: [facility] CATH LAB;  Service: Cardiovascular;;  . PTCA  12/09/2000   successful; of the proximal left anterior descending with reduction of 99% narrowing to 30% with improvement of TIMI grade 1 to TIMI grade 3 flow  . TONSILLECTOMY AND ADENOIDECTOMY  1943   HPI:  Pt is a 85 yo female presenting with AMS, found to have acute/early subacute infarct involving the anterolateral left frontal lobe, lateral left basal ganglia and anterior left insula with associated petechial hemorrhage. PMH includes:  dementia, CAD, anxiety, depression, vertigo, osteoporosis, HTN, HLD   Assessment / Plan / Recommendation Clinical Impression  Pt has signs of dysphagia with concern for aspiration and has reduced ability to follow commands for precautions or strategies given her cognitive-linguistic impairments (see speech/language evaluation for further details). Oral holding is noted along with reduced labial seal and insufficient sucking with a straw. She will initiate a swallow with either a dry spoon or when presented with another bolus, but there is coughing and throat clearing associated with thin and nectar thick liquids. Purees are more challenging to clear from her oral cavity and multiple swallows are noted across all consistencies. She does not appear ready for a PO diet today, but could have ice chips sparingly after oral care to let her use her musculature and keep her oral cavity moistened. If cortrak does not get placed today, could consider crushing essential meds in puree. SLP will f/u for PO readiness versus MBS. SLP Visit Diagnosis: Dysphagia, oropharyngeal phase (R13.12)    Aspiration Risk  Moderate aspiration risk    Diet Recommendation NPO except meds;Ice chips PRN after oral care   Medication Administration: Crushed with puree(if cortrak not placed)    Other  Recommendations Oral Care Recommendations: Oral care QID Other Recommendations: Have oral suction available   Follow up Recommendations Skilled Nursing facility      Frequency and Duration min 2x/week  2 weeks       Prognosis Prognosis for  Safe Diet Advancement: Good Barriers to Reach Goals: Language deficits      Swallow Study   General HPI: Pt is a 85 yo female presenting with AMS, found to have acute/early subacute infarct involving the anterolateral left frontal lobe, lateral left basal ganglia and anterior left insula with associated petechial hemorrhage. PMH includes: dementia, CAD, anxiety, depression, vertigo,  osteoporosis, HTN, HLD Type of Study: Bedside Swallow Evaluation Previous Swallow Assessment: none in chart Diet Prior to this Study: NPO Temperature Spikes Noted: No Respiratory Status: Room air History of Recent Intubation: No Behavior/Cognition: Alert;Doesn't follow directions Oral Cavity Assessment: Excessive secretions Oral Care Completed by SLP: No Oral Cavity - Dentition: Adequate natural dentition Vision: Functional for self-feeding Self-Feeding Abilities: Needs assist Patient Positioning: Upright in bed Baseline Vocal Quality: Not observed Volitional Cough: Cognitively unable to elicit Volitional Swallow: Unable to elicit    Oral/Motor/Sensory Function Overall Oral Motor/Sensory Function: (not following commands)   Ice Chips Ice chips: Impaired Presentation: Spoon Oral Phase Functional Implications: Oral holding   Thin Liquid Thin Liquid: Impaired Presentation: Cup;Self Fed;Spoon Oral Phase Impairments: Poor awareness of bolus Oral Phase Functional Implications: Oral holding Pharyngeal  Phase Impairments: Multiple swallows;Cough - Immediate    Nectar Thick Nectar Thick Liquid: Impaired Presentation: Cup;Self Fed Oral Phase Impairments: Poor awareness of bolus Oral phase functional implications: Oral holding Pharyngeal Phase Impairments: Multiple swallows;Throat Clearing - Immediate   Honey Thick Honey Thick Liquid: Not tested   Puree Puree: Impaired Presentation: Spoon;Self Fed Oral Phase Impairments: Poor awareness of bolus Oral Phase Functional Implications: Oral residue;Oral holding Pharyngeal Phase Impairments: Multiple swallows   Solid     Solid: Not tested      [name] 11/17/2019,4:28 PM  [name], M.A. CCC-SLP Acute Herbalist [phone] Office [phone]

## 2019-11-17 NOTE — ED Notes (Signed)
MD paged regarding daughter requesting an update and MRI results.

## 2019-11-17 NOTE — ED Notes (Signed)
[phone] Med tech on duty, [facility]

## 2019-11-17 NOTE — Progress Notes (Signed)
STROKE TEAM PROGRESS NOTE   INTERVAL HISTORY Pt lying in bed, global aphasia, left gaze preference, not following commands but moving all extremities symmetrically. CT and MRI showed moderate sized left MCA infarct. MRA also showed left M2 high grade stenosis. Did not pass swallow, will put in cortrak today.   Vitals:   11/17/19 1000 11/17/19 1030 11/17/19 1036 11/17/19 1200  BP: (!) 174/86 (!) 169/72 (!) 169/72 (!) 155/99  Pulse: 80 75  88  Resp: (!) 23 (!) 21  20  Temp:      TempSrc:      SpO2: 91% 91%  94%    CBC:  Recent Labs  Lab 11/09/2019 2317  WBC 6.9  NEUTROABS 4.2  HGB 16.1*  HCT 50.4*  MCV 92.3  PLT 210    Basic Metabolic Panel:  Recent Labs  Lab 11/04/2019 2317 11/17/19 0210 11/17/19 0341  NA 146*  --  147*  K 3.3*  --  3.3*  CL 110  --  113*  CO2 23  --  21*  GLUCOSE 153*  --  128*  BUN 37*  --  31*  CREATININE 1.24*  --  1.07*  CALCIUM 9.4  --  8.8*  MG  --  2.6*  --    Lipid Panel:     Component Value Date/Time   CHOL 139 11/17/2019 0341   TRIG 272 (H) 11/17/2019 0341   HDL 26 (L) 11/17/2019 0341   CHOLHDL 5.3 11/17/2019 0341   VLDL 54 (H) 11/17/2019 0341   LDLCALC 59 11/17/2019 0341   HgbA1c:  Lab Results  Component Value Date   HGBA1C 6.9 (H) 11/17/2019   Urine Drug Screen:     Component Value Date/Time   LABOPIA NONE DETECTED 11/17/2019 0213   COCAINSCRNUR NONE DETECTED 11/17/2019 0213   LABBENZ NONE DETECTED 11/17/2019 0213   AMPHETMU NONE DETECTED 11/17/2019 0213   THCU NONE DETECTED 11/17/2019 0213   LABBARB NONE DETECTED 11/17/2019 0213    Alcohol Level     Component Value Date/Time   ETH <10 11/17/2019 0210    IMAGING DG Abdomen 1 View  Result Date: 11/17/2019 CLINICAL DATA:  Initial evaluation for MRI clearance. EXAM: ABDOMEN - 1 VIEW COMPARISON:  None. FINDINGS: Bowel gas pattern within normal limits without obstruction or ileus. No abnormal bowel wall thickening. No soft tissue mass or abnormal calcification. No  metallic implant or radiopaque foreign body. Dextroscoliosis with advanced multilevel degenerative spondylosis noted within the lumbar spine. IMPRESSION: 1. No metallic implant or other contraindication for MRI within the abdomen. 2. Nonobstructive bowel gas pattern. Electronically Signed   By: Rise Mu M.D.   On: 11/17/2019 05:25   CT Head Wo Contrast  Result Date: 11/17/2019 CLINICAL DATA:  Encephalopathy. EXAM: CT HEAD WITHOUT CONTRAST TECHNIQUE: Contiguous axial images were obtained from the base of the skull through the vertex without intravenous contrast. COMPARISON:  December 02, 2017 FINDINGS: Brain: There is loss of gray-white differentiation involving approximately 5 cm area involving the left frontal lobe. There is no midline shift. No acute intracranial hemorrhage. Moderate atrophy and chronic microvascular ischemic changes are noted. Vascular: The left M2 is asymmetrically dense concerning for thrombus. Skull: Normal. Negative for fracture or focal lesion. Sinuses/Orbits: No acute finding. Other: None. IMPRESSION: 1. Findings concerning for an acute or subacute infarct involving the left frontal lobe as detailed above. 2. Asymmetric a dense left M2 concerning for thrombus. 3. No acute intracranial hemorrhage. 4. Moderate atrophy and chronic microvascular ischemic changes. These  results were called by telephone at the time of interpretation on 11/17/2019 at 12:37 am to provider [facility] , who verbally acknowledged these results. Electronically Signed   By: Katherine Mantle M.D.   On: 11/17/2019 00:42   MR ANGIO HEAD WO CONTRAST  Result Date: 11/17/2019 CLINICAL DATA:  Stroke, follow-up. Additional history provided: Altered mental status. EXAM: MRI HEAD WITHOUT CONTRAST MRA HEAD WITHOUT CONTRAST TECHNIQUE: Multiplanar, multiecho pulse sequences of the brain and surrounding structures were obtained without intravenous contrast. Angiographic images of the head were obtained using  MRA technique without contrast. COMPARISON:  Head CT 11/17/2019 FINDINGS: MRI HEAD FINDINGS Brain: Multiple sequences are motion degraded. There is a region of cortical/subcortical restricted diffusion within the anterolateral left frontal lobes measuring 5.5 x 3.4 cm in transaxial dimensions, consistent with acute/early subacute infarct. The infarct also extends to involve portions of the lateral left basal ganglia and anterior left insula. Associated T2/FLAIR hyperintensity at the infarct site. There is also SWI signal loss throughout the infarct territory consistent with petechial hemorrhage. Regional mass effect with minimal effacement of the left lateral ventricle frontal horn. No midline shift or extra-axial fluid collection. Background of moderate chronic small vessel ischemic disease. Chronic lacunar infarct within the right cerebellum. Mild generalized parenchymal atrophy. Vascular: Reported separately Skull and upper cervical spine: No focal marrow lesion. Sinuses/Orbits: Visualized orbits demonstrate no acute abnormality. Mild ethmoid sinus mucosal thickening. Small bilateral mastoid effusions. MRA HEAD FINDINGS The intracranial internal carotid arteries are patent. Mild atherosclerotic irregularity of these vessels without significant stenosis. The right middle cerebral artery is patent without significant proximal stenosis. The right anterior cerebral artery is patent. Mild-to-moderate focal stenosis at the origin of the right anterior cerebral artery. The M1 left middle cerebral artery is patent without significant stenosis. High-grade focal stenosis within a superior division proximal M2 left middle cerebral artery branch shortly beyond its origin (series 1018, image 13). There is also segmental near occlusive or occlusive stenosis within a proximal left M2 branch more distally. The left anterior cerebral artery is patent without significant proximal stenosis. The intracranial vertebral arteries are  patent without significant stenosis. The left vertebral artery is developmentally diminutive beyond the origin of the left PICA. The basilar artery is patent without significant stenosis. Predominantly fetal origin of the bilateral posterior cerebral arteries, which are patent without significant proximal stenosis. No intracranial aneurysm is identified. IMPRESSION: MRI brain: 1. Acute/early subacute infarct involving the anterolateral left frontal lobe, lateral left basal ganglia and anterior left insula with associated petechial hemorrhage, as described. 2. Mild associated mass effect with minimal effacement of the left lateral ventricle frontal horn. 3. Generalized parenchymal atrophy with moderate chronic small vessel ischemic disease. Chronic right cerebellar lacunar infarct. MRA head: 1. High-grade focal stenosis within a superior division proximal M2 left MCA branch shortly beyond its origin. There is also segmental near occlusive or occlusive stenosis within a proximal left M2 branch more distally. 2. Moderate focal stenosis at the origin of the right anterior right anterior cerebral artery. 3. Additional intracranial atherosclerotic disease as described with no other significant proximal arterial stenosis identified. Electronically Signed   By: Jackey Loge DO   On: 11/17/2019 08:19   MR Angiogram Neck W or Wo Contrast  Result Date: 11/17/2019 CLINICAL DATA:  Stroke, follow-up. EXAM: MRA NECK WITHOUT AND WITH CONTRAST TECHNIQUE: Multiplanar and multiecho pulse sequences of the neck were obtained without and with intravenous contrast. Angiographic images of the neck were obtained using MRA technique without and  with intravenous contrast. CONTRAST:  7mL GADAVIST GADOBUTROL 1 MMOL/ML IV SOLN COMPARISON:  Same-day MRI/MRA head 11/17/2019 FINDINGS: Standard aortic branching. The visualized aortic arch and proximal major branch vessels are unremarkable. The bilateral common and internal carotid arteries are  patent within the neck without significant stenosis. The vertebral arteries are codominant and patent within the neck without significant stenosis. IMPRESSION: The common carotid, internal carotid and vertebral arteries are patent within the neck without significant stenosis. Electronically Signed   By: Jackey Loge DO   On: 11/17/2019 08:25   MR BRAIN WO CONTRAST  Result Date: 11/17/2019 CLINICAL DATA:  Stroke, follow-up. Additional history provided: Altered mental status. EXAM: MRI HEAD WITHOUT CONTRAST MRA HEAD WITHOUT CONTRAST TECHNIQUE: Multiplanar, multiecho pulse sequences of the brain and surrounding structures were obtained without intravenous contrast. Angiographic images of the head were obtained using MRA technique without contrast. COMPARISON:  Head CT 11/17/2019 FINDINGS: MRI HEAD FINDINGS Brain: Multiple sequences are motion degraded. There is a region of cortical/subcortical restricted diffusion within the anterolateral left frontal lobes measuring 5.5 x 3.4 cm in transaxial dimensions, consistent with acute/early subacute infarct. The infarct also extends to involve portions of the lateral left basal ganglia and anterior left insula. Associated T2/FLAIR hyperintensity at the infarct site. There is also SWI signal loss throughout the infarct territory consistent with petechial hemorrhage. Regional mass effect with minimal effacement of the left lateral ventricle frontal horn. No midline shift or extra-axial fluid collection. Background of moderate chronic small vessel ischemic disease. Chronic lacunar infarct within the right cerebellum. Mild generalized parenchymal atrophy. Vascular: Reported separately Skull and upper cervical spine: No focal marrow lesion. Sinuses/Orbits: Visualized orbits demonstrate no acute abnormality. Mild ethmoid sinus mucosal thickening. Small bilateral mastoid effusions. MRA HEAD FINDINGS The intracranial internal carotid arteries are patent. Mild atherosclerotic  irregularity of these vessels without significant stenosis. The right middle cerebral artery is patent without significant proximal stenosis. The right anterior cerebral artery is patent. Mild-to-moderate focal stenosis at the origin of the right anterior cerebral artery. The M1 left middle cerebral artery is patent without significant stenosis. High-grade focal stenosis within a superior division proximal M2 left middle cerebral artery branch shortly beyond its origin (series 1018, image 13). There is also segmental near occlusive or occlusive stenosis within a proximal left M2 branch more distally. The left anterior cerebral artery is patent without significant proximal stenosis. The intracranial vertebral arteries are patent without significant stenosis. The left vertebral artery is developmentally diminutive beyond the origin of the left PICA. The basilar artery is patent without significant stenosis. Predominantly fetal origin of the bilateral posterior cerebral arteries, which are patent without significant proximal stenosis. No intracranial aneurysm is identified. IMPRESSION: MRI brain: 1. Acute/early subacute infarct involving the anterolateral left frontal lobe, lateral left basal ganglia and anterior left insula with associated petechial hemorrhage, as described. 2. Mild associated mass effect with minimal effacement of the left lateral ventricle frontal horn. 3. Generalized parenchymal atrophy with moderate chronic small vessel ischemic disease. Chronic right cerebellar lacunar infarct. MRA head: 1. High-grade focal stenosis within a superior division proximal M2 left MCA branch shortly beyond its origin. There is also segmental near occlusive or occlusive stenosis within a proximal left M2 branch more distally. 2. Moderate focal stenosis at the origin of the right anterior right anterior cerebral artery. 3. Additional intracranial atherosclerotic disease as described with no other significant proximal  arterial stenosis identified. Electronically Signed   By: Jackey Loge DO   On: 11/17/2019 08:19  DG Chest Port 1 View  Result Date: 11/24/2019 CLINICAL DATA:  85 year old with weakness and altered mental status. EXAM: PORTABLE CHEST 1 VIEW COMPARISON:  Radiograph 11/08/2019 FINDINGS: Low lung volumes. Borderline cardiomegaly unchanged from prior exam. Unchanged mediastinal contours. Coarse interstitial markings particularly at the lung bases, not significantly changed. No new airspace disease. No pleural fluid or pneumothorax. IMPRESSION: No acute findings.Unchanged coarse interstitial markings at the lung bases appear chronic. Electronically Signed   By: Narda RutherfordMelanie  Sanford M.D.   On: 11/12/2019 23:17   ECHOCARDIOGRAM COMPLETE  Result Date: 11/17/2019   ECHOCARDIOGRAM REPORT   Patient Name:   [name] Date of Exam: 11/17/2019 Medical Rec #:  [id number]     Height:       62.0 in Accession #:    [id number]    Weight:       163.4 lb Date of Birth:  Oct 28, 1927     BSA:          1.75 m Patient Age:    89 years      BP:           169/72 mmHg Patient Gender: F             HR:           75 bpm. Exam Location:  Inpatient Procedure: 2D Echo Indications:    Stroke 434.91 / I163.9  History:        Patient has prior history of Echocardiogram examinations, most                 recent 02/23/2018. CAD, Signs/Symptoms:dementia; Risk                 Factors:Hypertension and Dyslipidemia. Cannot walk or talk at                 this time.  Sonographer:    Leta Junglingiffany Cooper RDCS Referring Phys: [id number] [facility]  Sonographer Comments: Unable to turn on her left side. IMPRESSIONS  1. Left ventricular ejection fraction, by visual estimation, is 60 to 65%. The left ventricle has normal function. Left ventricular septal wall thickness was mildly increased. There is no left ventricular hypertrophy.  2. Left ventricular diastolic parameters are consistent with Grade I diastolic dysfunction (impaired relaxation).  3. Global  right ventricle has normal systolic function.The right ventricular size is normal. No increase in right ventricular wall thickness.  4. Left atrial size was normal.  5. Right atrial size was normal.  6. The mitral valve is normal in structure. Mild mitral valve regurgitation. No evidence of mitral stenosis.  7. The tricuspid valve is normal in structure. Tricuspid valve regurgitation is mild.  8. The aortic valve is normal in structure. Aortic valve regurgitation is trivial. No evidence of aortic valve sclerosis or stenosis.  9. The pulmonic valve was normal in structure. Pulmonic valve regurgitation is trivial. 10. Mildly elevated pulmonary artery systolic pressure. 11. The inferior vena cava is normal in size with greater than 50% respiratory variability, suggesting right atrial pressure of 3 mmHg. FINDINGS  Left Ventricle: Left ventricular ejection fraction, by visual estimation, is 60 to 65%. The left ventricle has normal function. The left ventricle is not well visualized. There is no left ventricular hypertrophy. Left ventricular diastolic parameters are consistent with Grade I diastolic dysfunction (impaired relaxation). Normal left atrial pressure. Right Ventricle: The right ventricular size is normal. No increase in right ventricular wall thickness. Global RV systolic function is has normal systolic function. The tricuspid  regurgitant velocity is 2.63 m/s, and with an assumed right atrial pressure  of 3 mmHg, the estimated right ventricular systolic pressure is mildly elevated at 30.7 mmHg. Left Atrium: Left atrial size was normal in size. Right Atrium: Right atrial size was normal in size Pericardium: There is no evidence of pericardial effusion. Mitral Valve: The mitral valve is normal in structure. Mild mitral valve regurgitation. No evidence of mitral valve stenosis by observation. Tricuspid Valve: The tricuspid valve is normal in structure. Tricuspid valve regurgitation is mild. Aortic Valve: The  aortic valve is normal in structure. Aortic valve regurgitation is trivial. The aortic valve is structurally normal, with no evidence of sclerosis or stenosis. Pulmonic Valve: The pulmonic valve was normal in structure. Pulmonic valve regurgitation is trivial. Pulmonic regurgitation is trivial. Aorta: The aortic root, ascending aorta and aortic arch are all structurally normal, with no evidence of dilitation or obstruction. Venous: The inferior vena cava is normal in size with greater than 50% respiratory variability, suggesting right atrial pressure of 3 mmHg. IAS/Shunts: No atrial level shunt detected by color flow Doppler. There is no evidence of a patent foramen ovale. No ventricular septal defect is seen or detected. There is no evidence of an atrial septal defect.  LEFT VENTRICLE PLAX 2D LVIDd:         4.50 cm  Diastology LVIDs:         2.90 cm  LV e' lateral:   5.11 cm/s LV PW:         1.00 cm  LV E/e' lateral: 12.4 LV IVS:        1.20 cm  LV e' medial:    5.87 cm/s LVOT diam:     1.60 cm  LV E/e' medial:  10.8 LV SV:         60 ml LV SV Index:   32.98 LVOT Area:     2.01 cm  RIGHT VENTRICLE RV S prime:     20.20 cm/s TAPSE (M-mode): 2.8 cm LEFT ATRIUM             Index LA diam:        3.00 cm 1.71 cm/m LA Vol (A2C):   48.0 ml 27.36 ml/m LA Vol (A4C):   49.4 ml 28.16 ml/m LA Biplane Vol: 50.0 ml 28.50 ml/m  AORTIC VALVE LVOT Vmax:   76.40 cm/s LVOT Vmean:  46.200 cm/s LVOT VTI:    0.132 m  AORTA Ao Root diam: 2.30 cm Ao Asc diam:  3.10 cm MITRAL VALVE                         TRICUSPID VALVE MV Area (PHT): 2.22 cm              TR Peak grad:   27.7 mmHg MV PHT:        99.04 msec            TR Vmax:        263.00 cm/s MV Decel Time: 342 msec MV E velocity: 63.60 cm/s  103 cm/s  SHUNTS MV A velocity: 102.00 cm/s 70.3 cm/s Systemic VTI:  0.13 m MV E/A ratio:  0.62        1.5       Systemic Diam: 1.60 cm  Fransico Him MD Electronically signed by Fransico Him MD Signature Date/Time: 11/17/2019/11:24:48 AM     Final     PHYSICAL EXAM  Temp:  [98.5 F (36.9 C)] 98.5 F (  36.9 C) (12/17 2227) Pulse Rate:  [62-88] 79 (12/18 1400) Resp:  [18-27] 22 (12/18 1400) BP: (133-177)/(56-99) 164/72 (12/18 1400) SpO2:  [88 %-94 %] 94 % (12/18 1400)  General - Well nourished, well developed, in no apparent distress, lethargic.  Ophthalmologic - fundi not visualized due to noncooperation.  Cardiovascular - Regular rhythm and rate.  Neuro - lethargic and eyes closed, however easily open with stimulation.  Global aphasia, no speech output, not following commands.  Left gaze preference, barely cross midline to the right.  Blinking to visual threat on the left and right lower quadrant, not right upper quadrant.  Not tracking on the right.  PERRL.  Right facial droop.  Tongue protrusion not corporative.  Bilateral upper extremity 3/5, symmetrical, bilateral lower extremity 3/5 with pain stimulation.  DTR 1+, no Babinski. Sensation, coordination and gait not tested.    ASSESSMENT/PLAN [name] is a 85 y.o. female with history of  dementia, coronary artery disease, hypertension hyperlipidemia presenting from [facility] with multiple days of lethargy, confusion and not acting herself..   Stroke:  left MCA moderate infarct in setting of L M2 stenosis, could be large vessel disease but cannot rule out cardioembolic source CT head acute/subacute L frontal lobe infarct. Dense L M2. Small vessel disease. Moderate Atrophy.   MRI  Anterolateral L frontal lobe, L basal ganglia and anterior L insular infarcts w/ hemorrhagic transformation. Mild mass effect and effacements L lateral ventricle. Small vessel disease. Atrophy. Old R cerebellar infarct    MRA head  High-grade superior proximal L M2 branch stenosis, near-occlusive L M2 distal stenosis. Moderate R ACA stenosis. Intracranial atherosclerosis.  MRA neck Unremarkable   2D Echo EF 60-65%. No source of embolus   LE venous doppler pending  May  consider 30 day cardiac event monitoring as outpt to rule out afib if above work up negative  LDL 59  HgbA1c 6.9  Lovenox 40 mg sq daily for VTE prophylaxis  clopidogrel 75 mg daily prior to admission, now on aspirin 300 mg suppository daily. Consider DAPT once po access  Therapy recommendations:  pending   Disposition:  pending   Dysphagia  Did not pass swallow  NPO  Put on IVF @ 75  Will place cortrack  Consider po meds once po access  Consider TF once cortrak placed  Hypertension  Stable . Permissive hypertension (OK if < 220/120) but gradually normalize in 3-5 days . BP within range now . Long-term BP goal normotensive  Hyperlipidemia  Home meds:  lipitor 20  Resume statin in hospital once po access  LDL 59, goal < 70  Continue statin at discharge  Likely Pre-Diabetes  HgbA1c 6.9, goal < 7.0  SSI  CBG monitoring  Other Stroke Risk Factors  Advanced age  Coronary artery disease s/p PTCA  Hx ischemic cardiomyopathy - EF 30% in 2002 currently EF 60--65%  Other Active Problems  Baseline dementia  Slightly elevated troponin 22  Hypokalemia 3.3 - supplement  Mild lactic acidosis   Hospital day # 0  Marvel Plan, MD PhD Stroke Neurology 11/17/2019 2:45 PM   To contact Stroke Continuity provider, please refer to [email or web address]. After hours, contact General Neurology

## 2019-11-17 NOTE — ED Provider Notes (Signed)
I assumed care in signout to follow-up on imaging.  CT head revealed stroke.  Unclear onset.  Patient is somnolent but easily arousable. Discussed with Dr. Rory Percy from with neurology, he will see the patient. Discussed with Dr. Marlowe Sax for admission.  Discussed with daughter Lenox Ahr via phone.  She was updated on plan.  She confirms the patient is a DNR  tPA in stroke considered but not given due to: Onset over 3-4.5hours      Ripley Fraise, MD 11/17/19 0107

## 2019-11-17 NOTE — ED Notes (Signed)
Patient transported to Xray and MRI  

## 2019-11-17 NOTE — H&P (Signed)
History and Physical    DAJAI WAHLERT KGM:[id number] DOB: Apr 05, 1927 DOA: 11/19/2019  PCP: Tresa Garter, MD Patient coming from: Nursing home  Chief Complaint: Altered mental status  HPI: CENDY OCONNOR is a 85 y.o. female with medical history significant of dementia, CAD status post PCI, ischemic cardiomyopathy, hypertension, hyperlipidemia, osteoporosis depression, anxiety presenting to the ED from her nursing home for evaluation of altered mental status.  Staff reported that patient has not been acting like herself for the past 1.5 days.  She is normally ambulatory but will not walk or talk at this time.  She was recently treated for UTI and just finished taking Keflex.  Patient is nonverbal and no history could be obtained from her.  ED Course: Afebrile and no leukocytosis.  Lactic acid 2.2.  Blood glucose 153.  Creatinine 1.2, baseline 0.9-1.2.  Rapid SARS-CoV-2 antigen test negative, PCR test pending.  UA and urine culture pending.  Blood culture x2 pending.  High-sensitivity troponin 22. UDS and blood ethanol level pending.  PT/INR, aPTT pending.  Chest x-ray showing no acute findings.  Head CT showing findings concerning for an acute or subacute infarct involving the left frontal lobe.  Asymmetric density of left M2 concerning for thrombus.  No acute intracranial hemorrhage.  1 L normal saline bolus given.  Patient was seen by neurology. Deemed not to be a candidate for TPA due to established hypodensity and not a candidate for endovascular thrombectomy due to symptoms ongoing for multiple days.  Review of Systems:  All systems reviewed and apart from history of presenting illness, are negative.  Past Medical History:  Diagnosis Date  . ANXIETY   . CORONARY ARTERY DISEASE    a. 11/2000 Cath/PTCA: LM 20d, LAD 99p (PTCA), LCX 70p, RCA dom, 92m, EF 30 dist ant AK;  b. 2008 Myoview: EF 75%, small ant apical scar, mild peri-infarct ischemia.  [name] DEMENTIA   . Dementia (HCC)   .  DEPRESSION   . DIVERTICULOSIS, COLON   . HERPES ZOSTER   . History of Ischemic Cardiomyopathy    a. 11/2000 LV gram: EF 30%, dist ant AK;  b. 2008 MV EF 75%.  [name] Hx of echocardiogram    Echo (8/15):  EF 55%, mild apical HK, Gr 1 DD, mild AI, mild MR, severe LAE, mod RAE, mod PI, PASP 38 mmHg  . HYPERLIPIDEMIA   . HYPERTENSION   . OSTEOPOROSIS   . VERTIGO     Past Surgical History:  Procedure Laterality Date  . LEFT HEART CATHETERIZATION WITH CORONARY ANGIOGRAM N/A 05/28/2014   Procedure: LEFT HEART CATHETERIZATION WITH CORONARY ANGIOGRAM;  Surgeon: Kathleene Hazel, MD;  Location: [facility] CATH LAB;  Service: Cardiovascular;  Laterality: N/A;  . PERCUTANEOUS CORONARY STENT INTERVENTION (PCI-S)  05/28/2014   Procedure: PERCUTANEOUS CORONARY STENT INTERVENTION (PCI-S);  Surgeon: Kathleene Hazel, MD;  Location: [facility] CATH LAB;  Service: Cardiovascular;;  . PTCA  12/09/2000   successful; of the proximal left anterior descending with reduction of 99% narrowing to 30% with improvement of TIMI grade 1 to TIMI grade 3 flow  . TONSILLECTOMY AND ADENOIDECTOMY  1943     reports that she has never smoked. She has never used smokeless tobacco. She reports that she does not drink alcohol or use drugs.  Allergies  Allergen Reactions  . Simvastatin Other (See Comments)    REACTION: memory problem, myalgia    Family History  Problem Relation Age of Onset  . Diabetes Mother   . Heart  attack Mother   . Sudden death Mother   . Hypertension Other   . Heart disease Other   . Coronary artery disease Other   . Hypertension Daughter     Prior to Admission medications   Medication Sig Start Date End Date Taking? Authorizing Provider  acetaminophen (TYLENOL) 325 MG tablet Take 2 tablets (650 mg total) by mouth every 4 (four) hours as needed for headache or mild pain. 05/29/14   Erlene Quan, PA-C  amLODipine (NORVASC) 5 MG tablet Take 0.5 tablets (2.5 mg total) by mouth daily. 06/01/18   Plotnikov,  Evie Lacks, MD  atorvastatin (LIPITOR) 20 MG tablet Take 0.5 tablets (10 mg total) by mouth daily. 06/01/18   Plotnikov, Evie Lacks, MD  carvedilol (COREG) 12.5 MG tablet TAKE 1 TABLET BY MOUTH TWICE DAILY WITH A MEAL 06/01/18   Plotnikov, Evie Lacks, MD  Cholecalciferol ([employer] VITAMIN D3) 1000 UNITS capsule Take 1 capsule (1,000 Units total) by mouth daily. 12/19/14   Plotnikov, Evie Lacks, MD  clopidogrel (PLAVIX) 75 MG tablet Take 1 tablet (75 mg total) by mouth daily. 06/01/18   Plotnikov, Evie Lacks, MD  Memantine HCl-Donepezil HCl (NAMZARIC) 28-10 MG CP24 Take 1 capsule by mouth daily. 06/01/18   Plotnikov, Evie Lacks, MD  mirtazapine (REMERON) 15 MG tablet Take 1 tablet (15 mg total) by mouth at bedtime. 06/01/18   Plotnikov, Evie Lacks, MD  nitroGLYCERIN (NITROSTAT) 0.4 MG SL tablet Place 1 tablet (0.4 mg total) under the tongue every 5 (five) minutes as needed for chest pain. 01/05/18   Plotnikov, Evie Lacks, MD  potassium chloride (KLOR-CON) 8 MEQ tablet Take 1 tablet (8 mEq total) by mouth daily. 12/07/18 12/07/19  Plotnikov, Evie Lacks, MD    Physical Exam: Vitals:   11/17/19 0000 11/17/19 0045 11/17/19 0130 11/17/19 0200  BP: (!) 151/69 137/63 (!) 158/92 (!) 154/73  Pulse:    62  Resp: 18 20 (!) 23 (!) 21  Temp:      TempSrc:      SpO2:    93%    Physical Exam  Constitutional: She appears well-developed and well-nourished. No distress.  HENT:  Head: Normocephalic.  Cardiovascular: Normal rate, regular rhythm and intact distal pulses.  Pulmonary/Chest: Effort normal and breath sounds normal. No respiratory distress. She has no wheezes. She has no rales.  Abdominal: Soft. Bowel sounds are normal. She exhibits no distension. There is no abdominal tenderness. There is no guarding.  Musculoskeletal:        General: No edema.     Cervical back: Neck supple.  Neurological:  Somnolent but opens eyes when her name is called Slight left gaze preference Right facial droop Not following any commands Noted  to have slight spontaneous movement of her left upper extremity  Skin: Skin is warm and dry. She is not diaphoretic.     Labs on Admission: I have personally reviewed following labs and imaging studies  CBC: Recent Labs  Lab 11-20-2019 2317  WBC 6.9  NEUTROABS 4.2  HGB 16.1*  HCT 50.4*  MCV 92.3  PLT 433   Basic Metabolic Panel: Recent Labs  Lab November 20, 2019 2317  NA 146*  K 3.3*  CL 110  CO2 23  GLUCOSE 153*  BUN 37*  CREATININE 1.24*  CALCIUM 9.4   GFR: CrCl cannot be calculated (Unknown ideal weight.). Liver Function Tests: Recent Labs  Lab November 20, 2019 2317  AST 32  ALT 22  ALKPHOS 71  BILITOT 0.7  PROT 8.4*  ALBUMIN 3.6  No results for input(s): LIPASE, AMYLASE in the last 168 hours. No results for input(s): AMMONIA in the last 168 hours. Coagulation Profile: Recent Labs  Lab 11/17/19 0153  INR 1.0   Cardiac Enzymes: No results for input(s): CKTOTAL, CKMB, CKMBINDEX, TROPONINI in the last 168 hours. BNP (last 3 results) No results for input(s): PROBNP in the last 8760 hours. HbA1C: No results for input(s): HGBA1C in the last 72 hours. CBG: No results for input(s): GLUCAP in the last 168 hours. Lipid Profile: No results for input(s): CHOL, HDL, LDLCALC, TRIG, CHOLHDL, LDLDIRECT in the last 72 hours. Thyroid Function Tests: No results for input(s): TSH, T4TOTAL, FREET4, T3FREE, THYROIDAB in the last 72 hours. Anemia Panel: No results for input(s): VITAMINB12, FOLATE, FERRITIN, TIBC, IRON, RETICCTPCT in the last 72 hours. Urine analysis:    Component Value Date/Time   COLORURINE YELLOW 11/08/2019 1922   APPEARANCEUR HAZY (A) 11/08/2019 1922   LABSPEC 1.018 11/08/2019 1922   PHURINE 6.0 11/08/2019 1922   GLUCOSEU NEGATIVE 11/08/2019 1922   GLUCOSEU NEGATIVE 12/06/2018 1439   HGBUR SMALL (A) 11/08/2019 1922   BILIRUBINUR NEGATIVE 11/08/2019 1922   KETONESUR 5 (A) 11/08/2019 1922   PROTEINUR 100 (A) 11/08/2019 1922   UROBILINOGEN 1.0 12/06/2018  1439   NITRITE POSITIVE (A) 11/08/2019 1922   LEUKOCYTESUR NEGATIVE 11/08/2019 1922    Radiological Exams on Admission: CT Head Wo Contrast  Result Date: 11/17/2019 CLINICAL DATA:  Encephalopathy. EXAM: CT HEAD WITHOUT CONTRAST TECHNIQUE: Contiguous axial images were obtained from the base of the skull through the vertex without intravenous contrast. COMPARISON:  December 02, 2017 FINDINGS: Brain: There is loss of gray-white differentiation involving approximately 5 cm area involving the left frontal lobe. There is no midline shift. No acute intracranial hemorrhage. Moderate atrophy and chronic microvascular ischemic changes are noted. Vascular: The left M2 is asymmetrically dense concerning for thrombus. Skull: Normal. Negative for fracture or focal lesion. Sinuses/Orbits: No acute finding. Other: None. IMPRESSION: 1. Findings concerning for an acute or subacute infarct involving the left frontal lobe as detailed above. 2. Asymmetric a dense left M2 concerning for thrombus. 3. No acute intracranial hemorrhage. 4. Moderate atrophy and chronic microvascular ischemic changes. These results were called by telephone at the time of interpretation on 11/17/2019 at 12:37 am to provider [facility] , who verbally acknowledged these results. Electronically Signed   By: Katherine Mantle M.D.   On: 11/17/2019 00:42   DG Chest Port 1 View  Result Date: 11/13/2019 CLINICAL DATA:  85 year old with weakness and altered mental status. EXAM: PORTABLE CHEST 1 VIEW COMPARISON:  Radiograph 11/08/2019 FINDINGS: Low lung volumes. Borderline cardiomegaly unchanged from prior exam. Unchanged mediastinal contours. Coarse interstitial markings particularly at the lung bases, not significantly changed. No new airspace disease. No pleural fluid or pneumothorax. IMPRESSION: No acute findings.Unchanged coarse interstitial markings at the lung bases appear chronic. Electronically Signed   By: Narda Rutherford M.D.   On:  11/05/2019 23:17    EKG: Independently reviewed.  Sinus rhythm.  No significant change since prior tracing.  Assessment/Plan Principal Problem:   Acute CVA (cerebrovascular accident) (HCC) Active Problems:   Hypertension   Lactic acidosis   Elevated troponin   Hypokalemia   Acute to subacute ischemic stroke -suspicious for cardioembolic etiology Patient sent to the ED from her nursing home for evaluation of altered mental status.  She is normally ambulatory but has not been able to walk and has been nonverbal for the past 1.5 days. Head CT showing findings  concerning for an acute or subacute infarct involving the left frontal lobe.  Asymmetric density of left M2 concerning for thrombus.  No acute intracranial hemorrhage. Patient was seen by neurology and deemed not to be a candidate for TPA due to established hypodensity and not a candidate for endovascular thrombectomy due to symptoms ongoing for multiple days. -Telemetry monitoring -MRI of the brain without contrast -MRA head without contrast, MRA neck with contrast -2D echocardiogram -Hemoglobin A1c, fasting lipid panel -Aspirin 325 p.o. or 300 mg PR -Statin intolerance (myalgias) recorded in the chart.  Recommendations per neurology. -Frequent neurochecks -PT, OT, speech therapy. -N.p.o. until cleared by bedside swallow evaluation or formal speech evaluation -Okay to normalize blood pressure per neurology -Frequent neurochecks -If A. fib found on telemetry, will need anticoagulation.  Decision pending imaging and stroke team rounding.  Mild lactic acidosis Likely secondary to dehydration.  No fever or leukocytosis to suggest underlying infection.  Chest x-ray not suggestive of pneumonia. -UA pending -Give a small fluid bolus and repeat lactic acid level.  Does have ischemic cardiomyopathy but systolic function was normal on last echo and currently does not have any signs of volume overload.  Mildly elevated  troponin High-sensitivity troponin 22.  Does have a history of CAD status post PCI, however, EKG not suggestive of ACS. -Cardiac monitoring -Trend troponin  Mild hypokalemia Potassium 3.3. -Replete potassium.  Check magnesium level and replete if low.  Continue to monitor electrolytes.  Hypertension Systolic in the 150s to 170s. -Hydralazine as needed for SBP >160  Pharmacy med rec pending.  DVT prophylaxis: Lovenox Code Status: ED provider Dr. Bebe ShaggyWickline discussed CODE STATUS with the patient's daughter who confirmed that patient is DNR. Family Communication: No family available at this time. Disposition Plan: Anticipate discharge to nursing home after clinical improvement. Consults called: Neurology Admission status: It is my clinical opinion that admission to INPATIENT is reasonable and necessary in this 85 y.o. female . presenting with acute to subacute ischemic stroke.  Needs stroke work-up.  Given the aforementioned, the predictability of an adverse outcome is felt to be significant. I expect that the patient will require at least 2 midnights in the hospital to treat this condition.   The medical decision making on this patient was of high complexity and the patient is at high risk for clinical deterioration, therefore this is a level 3 visit.  John GiovanniVasundhra Dontray Haberland MD [facility] Pager [phone]  If 7PM-7AM, please contact night-coverage [email or web address] Password [email or web address]  11/17/2019, 2:23 AM

## 2019-11-17 NOTE — ED Notes (Signed)
Patient returned from xray.

## 2019-11-17 NOTE — ED Notes (Signed)
[facility] updated

## 2019-11-17 NOTE — ED Notes (Signed)
Patient transported to MRI 

## 2019-11-17 NOTE — Consult Note (Signed)
Neurology Consultation  Reason for Consult: stroke Referring Physician: Dr Bebe ShaggyWickline  CC: Altered mental status  History is obtained from: Chart review  HPI: [name] is a 85 y.o. female past medical history of dementia, coronary artery disease, hypertension hyperlipidemia brought in from a facility for multiple days of lethargy, and confusion and not acting like herself.  Was seen here in the ER 7 days ago for a UTI, placed on Keflex and discharged.  According to the staff per EMS, she has not been acting like this over the last few days.  She has not been ambulatory or talking-which is different from her baseline.  No reported preceding illnesses sicknesses fevers chills shortness of breath nausea vomiting or abdominal complaints. Noncontrast head CT reveals a subacute stroke in the left frontal area. Neurological consultation for further recommendations   LKW: Multiple days ago-unclear tpa given?: no, establish stroke on CT, unclear last normal Premorbid modified Rankin scale (mRS): Unable to ascertain.  No family at bedside.  ROS:  Unable to obtain due to aphasia  Past Medical History:  Diagnosis Date  . ANXIETY   . CORONARY ARTERY DISEASE    a. 11/2000 Cath/PTCA: LM 20d, LAD 99p (PTCA), LCX 70p, RCA dom, 2485m, EF 30 dist ant AK;  b. 2008 Myoview: EF 75%, small ant apical scar, mild peri-infarct ischemia.  [name]. DEMENTIA   . Dementia (HCC)   . DEPRESSION   . DIVERTICULOSIS, COLON   . HERPES ZOSTER   . History of Ischemic Cardiomyopathy    a. 11/2000 LV gram: EF 30%, dist ant AK;  b. 2008 MV EF 75%.  [name]. Hx of echocardiogram    Echo (8/15):  EF 55%, mild apical HK, Gr 1 DD, mild AI, mild MR, severe LAE, mod RAE, mod PI, PASP 38 mmHg  . HYPERLIPIDEMIA   . HYPERTENSION   . OSTEOPOROSIS   . VERTIGO     Family History  Problem Relation Age of Onset  . Diabetes Mother   . Heart attack Mother   . Sudden death Mother   . Hypertension Other   . Heart disease Other   . Coronary  artery disease Other   . Hypertension Daughter     Social History:   reports that she has never smoked. She has never used smokeless tobacco. She reports that she does not drink alcohol or use drugs.  Medications  Current Facility-Administered Medications:  .  sodium chloride 0.9 % bolus 1,000 mL, 1,000 mL, Intravenous, Once, Terrilee FilesButler, Michael C, MD  Current Outpatient Medications:  .  acetaminophen (TYLENOL) 325 MG tablet, Take 2 tablets (650 mg total) by mouth every 4 (four) hours as needed for headache or mild pain., Disp: , Rfl:  .  amLODipine (NORVASC) 5 MG tablet, Take 0.5 tablets (2.5 mg total) by mouth daily., Disp: 45 tablet, Rfl: 3 .  atorvastatin (LIPITOR) 20 MG tablet, Take 0.5 tablets (10 mg total) by mouth daily., Disp: 45 tablet, Rfl: 3 .  carvedilol (COREG) 12.5 MG tablet, TAKE 1 TABLET BY MOUTH TWICE DAILY WITH A MEAL, Disp: 180 tablet, Rfl: 3 .  Cholecalciferol ([employer]S VITAMIN D3) 1000 UNITS capsule, Take 1 capsule (1,000 Units total) by mouth daily., Disp: 100 capsule, Rfl: 3 .  clopidogrel (PLAVIX) 75 MG tablet, Take 1 tablet (75 mg total) by mouth daily., Disp: 90 tablet, Rfl: 3 .  Memantine HCl-Donepezil HCl (NAMZARIC) 28-10 MG CP24, Take 1 capsule by mouth daily., Disp: 90 capsule, Rfl: 1 .  mirtazapine (REMERON) 15  MG tablet, Take 1 tablet (15 mg total) by mouth at bedtime., Disp: 90 tablet, Rfl: 1 .  nitroGLYCERIN (NITROSTAT) 0.4 MG SL tablet, Place 1 tablet (0.4 mg total) under the tongue every 5 (five) minutes as needed for chest pain., Disp: 25 tablet, Rfl: 0 .  potassium chloride (KLOR-CON) 8 MEQ tablet, Take 1 tablet (8 mEq total) by mouth daily., Disp: 30 tablet, Rfl: 0   Exam: Current vital signs: BP (!) 149/56   Pulse 63   Temp 98.5 F (36.9 C) (Rectal)   Resp (!) 24   SpO2 91%  Vital signs in last 24 hours: Temp:  [98.5 F (36.9 C)] 98.5 F (36.9 C) (12/17 2227) Pulse Rate:  [63-65] 63 (12/17 2230) Resp:  [21-24] 24 (12/17 2300) BP:  (149-161)/(56-68) 149/56 (12/17 2300) SpO2:  [91 %-93 %] 91 % (12/17 2230) GENERAL: Awake, alert in NAD HEENT: - Normocephalic and atraumatic, dry mm, no LN++, no Thyromegally LUNGS - Clear to auscultation bilaterally with no wheezes CV - S1S2 RRR, no m/r/g, equal pulses bilaterally. ABDOMEN - Soft, nontender, nondistended with normoactive BS Ext: warm, well perfused, intact peripheral pulses, 1+ pitting edema bilaterally  NEURO:  Mental Status: Awake, alert.  Does not follow commands. Nonverbal. Cranial Nerves: Pupils appear equal round reactive to light, slight leftward gaze preference but is able to look to the right after crossing midline, does not blink to threat from the right but that is inconsistent, prominent right lower facial weakness. Motor: She is able to move all 4 extremities but unable to keep any of them above antigravity due to inability to follow commands. Tone: is normal and bulk is normal Sensation-questionable weaker withdrawal on the right lower extremity noxious stimulation in comparison to the left. Coordination: Unable to perform due to mental status Gait- deferred  NIHSS 1a Level of Conscious.: 0 1b LOC Questions: 2 1c LOC Commands: 2 2 Best Gaze: 1 3 Visual: 1 4 Facial Palsy: 2 5a Motor Arm - left: 1 5b Motor Arm - Right: 1 6a Motor Leg - Left: 2 6b Motor Leg - Right: 2 7 Limb Ataxia: 0 8 Sensory: 1 9 Best Language: 3 10 Dysarthria: 2 11 Extinct. and Inatten.: 0 TOTAL: 20  Labs I have reviewed labs in epic and the results pertinent to this consultation are:  CBC    Component Value Date/Time   WBC 6.9 11/08/2019 2317   RBC 5.46 (H) 11/26/2019 2317   HGB 16.1 (H) 10/31/2019 2317   HCT 50.4 (H) 11/28/2019 2317   PLT 210 11/10/2019 2317   MCV 92.3 11/17/2019 2317   MCH 29.5 11/25/2019 2317   MCHC 31.9 11/13/2019 2317   RDW 15.6 (H) 11/13/2019 2317   LYMPHSABS 1.7 11/21/2019 2317   MONOABS 0.9 11/19/2019 2317   EOSABS 0.0 11/04/2019 2317    BASOSABS 0.0 11/08/2019 2317   CMP     Component Value Date/Time   NA 146 (H) 11/07/2019 2317   K 3.3 (L) 11/07/2019 2317   CL 110 11/04/2019 2317   CO2 23 11/07/2019 2317   GLUCOSE 153 (H) 11/13/2019 2317   BUN 37 (H) 11/20/2019 2317   CREATININE 1.24 (H) 11/28/2019 2317   CALCIUM 9.4 11/28/2019 2317   PROT 8.4 (H) 11/11/2019 2317   ALBUMIN 3.6 10/31/2019 2317   AST 32 11/09/2019 2317   ALT 22 11/29/2019 2317   ALKPHOS 71 11/24/2019 2317   BILITOT 0.7 11/24/2019 2317   GFRNONAA 38 (L) 11/18/2019 2317   GFRAA 44 (L)  12/10/19 2317   Imaging I have reviewed the images obtained: CT-scan of the brain-subacute left MCA territory stroke involving the left frontal lobe.  Assessment:  85 year old with past medical history as above with multiple days of acting confused/not like herself and inability to walk with a CT scan that shows a subacute left frontal stroke.  Not a candidate for TPA due to established hypodensity Not a candidate for endovascular thrombectomy due to symptoms ongoing for multiple days.  Impression: Acute to subacute ischemic stroke-suspicious for cardioembolic etiology.  Could also be atheroembolic.  Needs further work-up.  Recommendations: -Admit to hospitalist or observation -Telemetry monitoring -Multiple days of ongoing symptoms-can normalize blood pressure.  No need for permissive hypertension. -MRI brain without contrast -MRA head without contrast, MRA neck with contrast -Echocardiogram -HgbA1c, fasting lipid panel -Frequent neuro checks -Prophylactic therapy-Antiplatelet med: Aspirin - dose 325mg  PO or 300mg  PR -Atorvastatin 80 mg PO daily -PT consult, OT consult, Speech consult -If Afib found on telemetry, will need anticoagulation. Decision pending imaging and stroke team rounding.  Please page stroke NP/PA/MD (listed on AMION)  from 8am-4 pm as this patient will be followed by the stroke team at this point.   -- , MD [facility] Pager: [phone] If 7pm to 7am, please call on call as listed on AMION.

## 2019-11-17 NOTE — ED Notes (Signed)
Attempted to call speech with no answer

## 2019-11-18 ENCOUNTER — Inpatient Hospital Stay (HOSPITAL_COMMUNITY): Payer: Medicare Other

## 2019-11-18 DIAGNOSIS — I639 Cerebral infarction, unspecified: Secondary | ICD-10-CM

## 2019-11-18 LAB — BASIC METABOLIC PANEL
Anion gap: 14 (ref 5–15)
BUN: 16 mg/dL (ref 8–23)
CO2: 21 mmol/L — ABNORMAL LOW (ref 22–32)
Calcium: 8.4 mg/dL — ABNORMAL LOW (ref 8.9–10.3)
Chloride: 112 mmol/L — ABNORMAL HIGH (ref 98–111)
Creatinine, Ser: 0.94 mg/dL (ref 0.44–1.00)
GFR calc Af Amer: >60 mL/min (ref >60)
GFR calc non Af Amer: 53 mL/min — ABNORMAL LOW (ref >60)
Glucose, Bld: 138 mg/dL — ABNORMAL HIGH (ref 70–99)
Potassium: 2.9 mmol/L — ABNORMAL LOW (ref 3.5–5.1)
Sodium: 147 mmol/L — ABNORMAL HIGH (ref 135–145)

## 2019-11-18 LAB — CBC WITH DIFFERENTIAL/PLATELET
Abs Immature Granulocytes: 0.14 10*3/uL — ABNORMAL HIGH (ref 0.00–0.07)
Basophils Absolute: 0 10*3/uL (ref 0.0–0.1)
Basophils Relative: 0 %
Eosinophils Absolute: 0 10*3/uL (ref 0.0–0.5)
Eosinophils Relative: 0 %
HCT: 45.7 % (ref 36.0–46.0)
Hemoglobin: 14.6 g/dL (ref 12.0–15.0)
Immature Granulocytes: 2 %
Lymphocytes Relative: 22 %
Lymphs Abs: 2 10*3/uL (ref 0.7–4.0)
MCH: 29.3 pg (ref 26.0–34.0)
MCHC: 31.9 g/dL (ref 30.0–36.0)
MCV: 91.6 fL (ref 80.0–100.0)
Monocytes Absolute: 1.4 10*3/uL — ABNORMAL HIGH (ref 0.1–1.0)
Monocytes Relative: 16 %
Neutro Abs: 5.5 10*3/uL (ref 1.7–7.7)
Neutrophils Relative %: 60 %
Platelets: 259 10*3/uL (ref 150–400)
RBC: 4.99 MIL/uL (ref 3.87–5.11)
RDW: 15.4 % (ref 11.5–15.5)
WBC: 9 10*3/uL (ref 4.0–10.5)
nRBC: 0 % (ref 0.0–0.2)

## 2019-11-18 LAB — MRSA PCR SCREENING: MRSA by PCR: NEGATIVE

## 2019-11-18 MED ORDER — POTASSIUM CHLORIDE 10 MEQ/100ML IV SOLN
10.0000 meq | INTRAVENOUS | Status: AC
  Administered 2019-11-18 (×4): 10 meq via INTRAVENOUS
  Filled 2019-11-18 (×4): qty 100

## 2019-11-18 NOTE — Progress Notes (Signed)
This patient was admitted early this morning by my colleague Dr. Coral Spikes for acute to subacute ischemic stroke of the lrft frontal lobe due to thrombosis. Neurology has been consulted and stroke work up has been initiated. The patient is not deemed a candidate for TPA due to being outside the window for this therapy. She is also not deemed a candidate for endovascular thrombectomy, again, because her symptoms had been ongoing for a number of days.  As I see her, her vitals are within a normal range. She is hypokalemic, and this has been addressed. Otherwise laboratory findings are acceptable.  The patient is sleeping soundly. She rouses easily with stimulation, but goes back to sleep.  I have discussed the patient with her daughter, and with Dr. Erlinda Hong for neurology. He has ordered a cortrak for the patient. SLP has performed a swallow eval. The patient is not thought to be safe for swallowing at this time. Dietary has been consulted to make recommendations for tube feeds.  The patient will be sent up to a telemetry bed as soon as it becomes availavle.

## 2019-11-18 NOTE — Evaluation (Signed)
Occupational Therapy Evaluation Patient Details Name: [name] MRN: [id number] DOB: August 21, 1927 Today's Date: 11/18/2019    History of Present Illness Pt is a 85 yr old who presneted with AMS and found to have acute subacute infart with an anterolateral L frontal lobe, lateral L basal ganglia and anterior L insula. PMH but not limited to dementia, CAD, anxiety, depression, vertigo, HTN    Clinical Impression   Pt admitted withAMS. Pt currently with functional limitations due to the deficits listed below (see OT Problem List). Pt at this time responding to sound with opening eyes. Pt had slight cervical rotation. Pt could not complete any vocilizations in this session or complete one step commands. Pt will benefit from skilled OT to increase their safety and independence with ADL and functional mobility for ADL to facilitate discharge to venue listed below.     Follow Up Recommendations  Supervision/Assistance - 24 hour;SNF    Equipment Recommendations       Recommendations for Other Services       Precautions / Restrictions Precautions Precautions: Fall Restrictions Weight Bearing Restrictions: No      Mobility Bed Mobility Overal bed mobility: Needs Assistance             General bed mobility comments: appears to make some movments in the bed but not functional at this time  Transfers                      Balance                                           ADL either performed or assessed with clinical judgement   ADL Overall ADL's : Needs assistance/impaired Eating/Feeding: NPO;Bed level   Grooming: Total assistance;Bed level   Upper Body Bathing: Total assistance;Bed level   Lower Body Bathing: Total assistance;Bed level   Upper Body Dressing : Total assistance;Bed level   Lower Body Dressing: Total assistance;Bed level   Toilet Transfer: Total assistance   Toileting- Clothing Manipulation and Hygiene: Total assistance;Bed  level   Tub/ Shower Transfer: Total assistance   Functional mobility during ADLs: Total assistance       Vision Baseline Vision/History: (unknown ) Patient Visual Report: Other (comment)(unknown as pt can not report) Vision Assessment?: (unknown as not tracking)     Perception Perception Perception Tested?: (unable to test)   Praxis Praxis Praxis tested?: Not tested    Pertinent Vitals/Pain Pain Assessment: Faces Faces Pain Scale: No hurt Pain Intervention(s): Monitored during session;Other (comment)(watched for any facila changes)     Hand Dominance (unknown)   Extremity/Trunk Assessment Upper Extremity Assessment Upper Extremity Assessment: Difficult to assess due to impaired cognition;LUE deficits/detail;RUE deficits/detail   Lower Extremity Assessment Lower Extremity Assessment: Difficult to assess due to impaired cognition;LLE deficits/detail;RLE deficits/detail       Communication Communication Communication: Receptive difficulties;Expressive difficulties;Other (comment)(no communication)   Cognition Arousal/Alertness: Lethargic Behavior During Therapy: Flat affect Overall Cognitive Status: Difficult to assess                                     General Comments       Exercises Exercises: General Upper Extremity General Exercises - Upper Extremity Shoulder Flexion: PROM;Both;5 reps Shoulder Extension: PROM;5 reps   Shoulder Instructions  Home Living Family/patient expects to be discharged to:: Other (Comment)                                 Additional Comments: unknown       Prior Functioning/Environment Level of Independence: Needs assistance        Comments: per notes appeared needed some assist but unsure about level        OT Problem List: Decreased strength;Decreased range of motion;Decreased activity tolerance;Impaired balance (sitting and/or standing);Decreased cognition;Decreased safety  awareness;Decreased knowledge of use of DME or AE;Decreased knowledge of precautions;Pain;Impaired UE functional use      OT Treatment/Interventions: Self-care/ADL training;Therapeutic exercise;Neuromuscular education;DME and/or AE instruction;Manual therapy;Therapeutic activities;Patient/family education;Balance training    OT Goals(Current goals can be found in the care plan section) Acute Rehab OT Goals Patient Stated Goal: none reported OT Goal Formulation: Patient unable to participate in goal setting Time For Goal Achievement: 12/02/19 Potential to Achieve Goals: Fair ADL Goals Pt Will Perform Grooming: with max assist;bed level Pt Will Perform Upper Body Bathing: with max assist;bed level Pt/caregiver will Perform Home Exercise Program: With written HEP provided;Both right and left upper extremity  OT Frequency: Min 2X/week   Barriers to D/C: Decreased caregiver support          Co-evaluation              AM-PAC OT "6 Clicks" Daily Activity     Outcome Measure Help from another person eating meals?: Total Help from another person taking care of personal grooming?: Total Help from another person toileting, which includes using toliet, bedpan, or urinal?: Total Help from another person bathing (including washing, rinsing, drying)?: Total Help from another person to put on and taking off regular upper body clothing?: Total Help from another person to put on and taking off regular lower body clothing?: Total 6 Click Score: 6   End of Session    Activity Tolerance: Treatment limited secondary to medical complications (Comment)(limited ability to respond) Patient left: in bed;with call bell/phone within reach;with nursing/sitter in room  OT Visit Diagnosis: Unsteadiness on feet (R26.81);Other abnormalities of gait and mobility (R26.89);Repeated falls (R29.6);Muscle weakness (generalized) (M62.81);History of falling (Z91.81);Apraxia (R48.2);Feeding difficulties (R63.3);Pain                 Time: 1051-1102 OT Time Calculation (min): 11 min Charges:  OT General Charges $OT Visit: 1 Visit OT Evaluation $OT Eval Moderate Complexity: 1 Mod  Alphia Moh OTR/L  Acute Rehab Services  [phone] office number [phone] pager number   Alphia Moh 11/18/2019, 11:32 AM

## 2019-11-18 NOTE — Progress Notes (Signed)
PT Cancellation Note  Patient Details Name: [name] MRN: [id number] DOB: December 11, 1926   Cancelled Treatment:    Reason Eval/Treat Not Completed: Fatigue/lethargy limiting ability to participate  Therapy attempted to see patient. OT reported the patient only having reflexive movements at this time and is not appropriate for PT evaluation at this time. Therapy will follow up on 11/19/2019 unless patients alterness and orientation improves.   Carney Living PT DPT  11/18/2019, 1:43 PM

## 2019-11-18 NOTE — Progress Notes (Signed)
  Speech Language Pathology Treatment: Dysphagia;Cognitive-Linquistic  Patient Details Name: DANYALE RIDINGER MRN: [id number] DOB: 1927-04-20 Today's Date: 11/18/2019 Time: 3235-5732 SLP Time Calculation (min) (ACUTE ONLY): 20 min  Assessment / Plan / Recommendation Clinical Impression  Pt was seen for dysphagia and aphasia treatment on this date.  She was encountered awake, but lethargic in bed with daughter present at bedside.  Pt exhibited a few unintelligible vocalization throughout this session and she followed 1/3 one step commands during po trials given moderate verbal, visual, and tactile cues.  Pt was unable to answer yes/no questions via head movement or verbalization.  Attempted automatic counting (1-5) with pt, but she was unable to verbalize.  Despite this, pt with good engagement with SLP throughout the session via eye contact and smiling.   Oral care was provided prior to po trials and she tolerated it without difficulty.  Pt consumed trials of ice chips, thin liquid, and puree.  She exhibited good bolus acceptance with labial closure around the spoon and straw with all trials.  She presented with prolonged mastication and reduced lingual manipulation of ice chip trial with no observed swallow initiation; however, pt consistently triggered a swallow with all thin liquid and puree trials.  She additionally exhibited prolonged AP transport and reduced lingual manipulation of puree trials with suspected delayed swallow initiation.  However, no clinical s/sx of aspiration were observed with any trials including cough/throat clear, wet vocal quality, etc.  Recommend initiation of a Dysphagia 1 (puree) and thin liquid diet with meds crushed in puree and frequent oral care.  Pt will benefit from full supervision to assist with po intake and to cue for compensatory strategies (see below).  Pt may be at an elevated risk for silent aspiration; therefore SLP will closely monitor diet tolerance and adjust  diet recommendations as necessary.  Additionally, despite initiation of po diet, suspect that pt may not be able to meet her nutritional needs at this time; therefore, short-term alternative means of nutrition may be beneficial.  SLP educated pt, daughter, and RN regarding recommendations.     HPI HPI: Pt is a 85 yo female presenting with AMS, found to have acute/early subacute infarct involving the anterolateral left frontal lobe, lateral left basal ganglia and anterior left insula with associated petechial hemorrhage. PMH includes: dementia, CAD, anxiety, depression, vertigo, osteoporosis, HTN, HLD      SLP Plan  Goals updated       Recommendations  Diet recommendations: Thin liquid;Dysphagia 1 (puree) Liquids provided via: Teaspoon;Straw;Cup Medication Administration: Crushed with puree Supervision: Full supervision/cueing for compensatory strategies;Staff to assist with self feeding Compensations: Minimize environmental distractions;Slow rate;Small sips/bites;Lingual sweep for clearance of pocketing;Follow solids with liquid Postural Changes and/or Swallow Maneuvers: Seated upright 90 degrees                Oral Care Recommendations: Oral care QID;Staff/trained caregiver to provide oral care Follow up Recommendations: Skilled Nursing facility;24 hour supervision/assistance SLP Visit Diagnosis: Aphasia (R47.01);Dysphagia, unspecified (R13.10) Plan: Goals updated                      Colin Mulders M.S., CCC-SLP Acute Rehabilitation Services Office: [phone]  [place] 11/18/2019, 3:00 PM

## 2019-11-18 NOTE — Progress Notes (Signed)
PROGRESS NOTE  [name] BPZ:[id number] DOB: 04-18-1927 DOA: 12/07/2019 PCP: Tresa Garter, MD  Brief History   [name] is a 85 y.o. female with medical history significant of dementia, CAD status post PCI, ischemic cardiomyopathy, hypertension, hyperlipidemia, osteoporosis depression, anxiety presenting to the ED from her nursing home for evaluation of altered mental status.  Staff reported that patient has not been acting like herself for the past 1.5 days.  She is normally ambulatory but will not walk or talk at this time.  She was recently treated for UTI and just finished taking Keflex.  Patient is nonverbal and no history could be obtained from her.  ED Course: Afebrile and no leukocytosis.  Lactic acid 2.2.  Blood glucose 153.  Creatinine 1.2, baseline 0.9-1.2.  Rapid SARS-CoV-2 antigen test negative, PCR test pending.  UA and urine culture pending.  Blood culture x2 pending.  High-sensitivity troponin 22. UDS and blood ethanol level pending.  PT/INR, aPTT pending.  Chest x-ray showing no acute findings.  Head CT showing findings concerning for an acute or subacute infarct involving the left frontal lobe.  Asymmetric density of left M2 concerning for thrombus.  No acute intracranial hemorrhage.  1 L normal saline bolus given.  Patient was seen by neurology. Deemed not to be a candidate for TPA due to established hypodensity and not a candidate for endovascular thrombectomy due to symptoms ongoing for multiple days.  The patient was roomed in the ED for the first day and stroke work up was in process. She was seen by neurology. SLP evaluated and determined that her swallow was not safe and that alternative means for nutrition should be sought. Echocardiogram was obtained that demonstrated. It demonstrated left ventricular ejection fraction of 60-65% with Grade 1 DD. No intracardiac source of thrombus was seen. MRI confirmed infarct of left frontal lobe. MRA demonstrated likely  thrombus of the left M2.  Dr. Roda Shutters has ordered cortrak. However, this will not be able to be placed until Monday. NGT has been ordered to be placed by nursing in its stead. SLP has determined that the patient has dense expressive aphasia. BL lower extremity dopplers were obtained this morning. They were negative for DVT.  Consultants  . Neurology  Procedures  . none  Antibiotics   Anti-infectives (From admission, onward)   None    .  Subjective  The patient is resting comfortably. She is not verbal.   Objective   Vitals:  Vitals:   11/18/19 0743 11/18/19 1112  BP: (!) 153/58 (!) 164/63  Pulse: 75 80  Resp:    Temp: 98.5 F (36.9 C) 98.5 F (36.9 C)  SpO2: 97% 96%   Exam:  Constitutional:  . The patient is awake this morning. She does not appear to be in any acute distress. Eyes:  . pupils and irises appear normal . Gaze tends to be leftward . Normal lids and conjunctivae Respiratory:  . No increased work of breathing. . No wheezes, rales, or rhonchi . No tactile fremitus Cardiovascular:  . Regular rate and rhythm . No murmurs, ectopy, or gallups. . No lateral PMI. No thrills. Abdomen:  . Abdomen is soft, non-tender, non-distended . No hernias, masses, or organomegaly . Normoactive bowel sounds.  Musculoskeletal:  . No cyanosis, clubbing, or edema Skin:  . No rashes, lesions, ulcers . palpation of skin: no induration or nodules Neurologic:  . Patient is moving all extremities Psychiatric:  . Mental status . Unable to evaluate as the patient  is unable to cooperate with exam.   I have personally reviewed the following:   Today's Data  . Vitals, BMP, CBC  Imaging  . B/L LE dopplers . MRI/MRA brain . CT head  Cardiology Data  . Echocardiogram  Scheduled Meds: .  stroke: mapping our early stages of recovery book   Does not apply Once  . aspirin  300 mg Rectal Daily   Or  . aspirin  325 mg Oral Daily  . enoxaparin (LOVENOX) injection  40 mg  Subcutaneous Daily   Continuous Infusions: . sodium chloride 75 mL/hr at 11/18/19 0443  . potassium chloride 10 mEq (11/18/19 1211)    Principal Problem:   Acute CVA (cerebrovascular accident) ([facility]) Active Problems:   Hypertension   Lactic acidosis   Elevated troponin   Hypokalemia   LOS: 1 day   A & P  Acute to subacute ischemic infarct of the left frontal lobe: Head CT showing findings concerning for an acute or subacute infarct involving the left frontal lobe. No acute intracranial hemorrhage. Patient was seen by neurology and deemed not to be a candidate for TPA due to established hypodensity and not a candidate for endovascular thrombectomy due to symptoms ongoing for multiple days. MRA demonstrated likely thrombus of the left M2 segment of the left MCA. Echocardiogram was obtained that demonstrated. It demonstrated left ventricular ejection fraction of 60-65% with Grade 1 DD. No intracardiac source of thrombus was seen. She has been started on Aspirin 300 mg PR. She has not been started on a statin due to intolerance of statins (myalgias). She has dense expressive aphasia and dysphagia. She will be kept NPO and have an NGT placed for tube feeds. She will need anticoagulation should she be found to be in atrial fibrillation.  I greatly appreciate neurology's assistance.  Mild lactic acidosis: Resolved. Likely secondary to dehydration.  No fever or leukocytosis to suggest underlying infection. Chest x-ray not suggestive of pneumonia.  Mildly elevated troponin: Troponin trending down. EKG without ischemic changes. Does have a history of CAD status post PCI, however, EKG not suggestive of ACS. Continue telemetry.  Mild hypokalemia: 2.9 this morning. Supplemented. Monitor. Magnesium level normal.   Hypertension: Systolic in the 094B to 096G. IV Hydralazine is available as needed for SBP >160.  I have seen and examined this patient myself. I have spent 34 minutes in her evaluation  and care.  Pt resided in a memory care facility [facility]) prior to admission.  DVT prophylaxis: Lovenox Code Status: DNR. Family Communication: I have  Disposition Plan: Anticipate discharge to nursing home after clinical improvement.  Eileen Colville, DO [facility] Direct contact: see [email or web address]  7PM-7AM contact night coverage as above 11/18/2019, 12:49 PM  LOS: 1 day

## 2019-11-18 NOTE — Progress Notes (Signed)
Initial Nutrition Assessment  DOCUMENTATION CODES:   Not applicable  INTERVENTION:  Once tube placed, recommend initiating: -Osmolite 1.2 @ 25 ml/hr, advance 10 ml every 8 hrs to goal rate 45 ml/hr (1080 ml/day) -Prostat 30 ml daily via tube  Tube feed regimen at goal rate 1080 ml/day provides 1396 kcal, 75 grams of protein, 887 ml free H20 from formula  Cortrak services to resume on 11/20/19  NUTRITION DIAGNOSIS:   Inadequate oral intake related to inability to eat as evidenced by NPO status.  GOAL:   Patient will meet greater than or equal to 90% of their needs   MONITOR:   Labs, I & O's, TF tolerance, Diet advancement  REASON FOR ASSESSMENT:   Consult Enteral/tube feeding initiation and management  ASSESSMENT:  RD working remotely.  85 year old female with medical history significant of dementia, CAD s/p PCI, ischemic cardiomyopathy, HTN, HLD, osteoporosis, depression and anxiety presented to ED from her nursing home for evaluation of altered mental status. Staff reports that patient has not been acting like herself for the past 1.5 days, normally she is ambulatory and has not been able to walk and nonverbal. Head CT showed findings concerning for acute infarct of left frontal lobe; asymmetric density of left M2 concerning for thrombus. Patient seen by neurology and deemed not a candidate for TPA d/t established hypodensity and not a candidate for endovascular thrombectomy d/t ongoing symptoms for multiple days.  MRI - moderate sized left MCA infarct;M2 high grade stenosis  Per notes, pt with global aphasia, left gaze preference, not following commands, but moving all extremities. Patient did not pass swallow evaluation. As of 4:45 pm pt remained in ED and Cortrak unfortunately unable to be placed yesterday.   Cortrak services to resume on Monday. RD spoke with RN this morning via phone and made aware. Recommendations for tube feeding provided above.  I/Os: +1573 ml  since admit  Current wt 66.6 kg (146.5 lb) Weight history reviewed, noted 16.5 lb (10.1%) wt loss in the past 3 months which is severe for time frame.  08/03/19 74.1 kg  12/06/18 74.4 kg    NUTRITION - FOCUSED PHYSICAL EXAM: Unable to complete at this time, RD working remotely.   Diet Order:   Diet Order            Diet NPO time specified  Diet effective now              EDUCATION NEEDS:   No education needs have been identified at this time  Skin:  Skin Assessment: Reviewed RN Assessment  Last BM:  PTA  Height:   Ht Readings from Last 1 Encounters:  08/03/19 5\' 2"  (1.575 m)    Weight:   Wt Readings from Last 1 Encounters:  11/17/19 66.6 kg    Ideal Body Weight:  50 kg  BMI:  Body mass index is 26.85 kg/m.  Estimated Nutritional Needs:   Kcal:  1255-1400  Protein:  63-70  Fluid:  >/= 1 L/day   Lajuan Lines, RD, LDN Clinical Nutrition [facility] Telephone [phone] After Hours/Weekend Pager: [phone]

## 2019-11-18 NOTE — Progress Notes (Signed)
STROKE TEAM PROGRESS NOTE   INTERVAL HISTORY Pt lying in bed, globalyy aphasic, left gaze preference, not following commands but moving all extremities symmetrically.   Did not pass swallow, awaiting cortrak tube placement.Vitals stable. No changes   Vitals:   11/17/19 2357 11/18/19 0402 11/18/19 0743 11/18/19 1112  BP: (!) 165/68 (!) 155/92 (!) 153/58 (!) 164/63  Pulse: 80 78 75 80  Resp: 20 18    Temp:  97.9 F (36.6 C) 98.5 F (36.9 C) 98.5 F (36.9 C)  TempSrc:  Oral Oral Oral  SpO2: 94% 97% 97% 96%  Weight:        CBC:  Recent Labs  Lab November 19, 2019 2317 11/18/19 0247  WBC 6.9 9.0  NEUTROABS 4.2 5.5  HGB 16.1* 14.6  HCT 50.4* 45.7  MCV 92.3 91.6  PLT 210 259    Basic Metabolic Panel:  Recent Labs  Lab 11/17/19 0210 11/17/19 1445 11/18/19 0247  NA  --  147* 147*  K  --  2.8* 2.9*  CL  --  117* 112*  CO2  --  16* 21*  GLUCOSE  --  117* 138*  BUN  --  17 16  CREATININE  --  0.73 0.94  CALCIUM  --  7.0* 8.4*  MG 2.6*  --   --    Lipid Panel:     Component Value Date/Time   CHOL 139 11/17/2019 0341   TRIG 272 (H) 11/17/2019 0341   HDL 26 (L) 11/17/2019 0341   CHOLHDL 5.3 11/17/2019 0341   VLDL 54 (H) 11/17/2019 0341   LDLCALC 59 11/17/2019 0341   HgbA1c:  Lab Results  Component Value Date   HGBA1C 6.9 (H) 11/17/2019   Urine Drug Screen:     Component Value Date/Time   LABOPIA NONE DETECTED 11/17/2019 0213   COCAINSCRNUR NONE DETECTED 11/17/2019 0213   LABBENZ NONE DETECTED 11/17/2019 0213   AMPHETMU NONE DETECTED 11/17/2019 0213   THCU NONE DETECTED 11/17/2019 0213   LABBARB NONE DETECTED 11/17/2019 0213    Alcohol Level     Component Value Date/Time   ETH <10 11/17/2019 0210    IMAGING DG Abdomen 1 View  Result Date: 11/17/2019 CLINICAL DATA:  Initial evaluation for MRI clearance. EXAM: ABDOMEN - 1 VIEW COMPARISON:  None. FINDINGS: Bowel gas pattern within normal limits without obstruction or ileus. No abnormal bowel wall thickening. No  soft tissue mass or abnormal calcification. No metallic implant or radiopaque foreign body. Dextroscoliosis with advanced multilevel degenerative spondylosis noted within the lumbar spine. IMPRESSION: 1. No metallic implant or other contraindication for MRI within the abdomen. 2. Nonobstructive bowel gas pattern. Electronically Signed   By: Rise Mu M.D.   On: 11/17/2019 05:25   CT Head Wo Contrast  Result Date: 11/17/2019 CLINICAL DATA:  Encephalopathy. EXAM: CT HEAD WITHOUT CONTRAST TECHNIQUE: Contiguous axial images were obtained from the base of the skull through the vertex without intravenous contrast. COMPARISON:  December 02, 2017 FINDINGS: Brain: There is loss of gray-white differentiation involving approximately 5 cm area involving the left frontal lobe. There is no midline shift. No acute intracranial hemorrhage. Moderate atrophy and chronic microvascular ischemic changes are noted. Vascular: The left M2 is asymmetrically dense concerning for thrombus. Skull: Normal. Negative for fracture or focal lesion. Sinuses/Orbits: No acute finding. Other: None. IMPRESSION: 1. Findings concerning for an acute or subacute infarct involving the left frontal lobe as detailed above. 2. Asymmetric a dense left M2 concerning for thrombus. 3. No acute intracranial hemorrhage. 4.  Moderate atrophy and chronic microvascular ischemic changes. These results were called by telephone at the time of interpretation on 11/17/2019 at 12:37 am to provider [facility] , who verbally acknowledged these results. Electronically Signed   By: Katherine Mantle M.D.   On: 11/17/2019 00:42   MR ANGIO HEAD WO CONTRAST  Result Date: 11/17/2019 CLINICAL DATA:  Stroke, follow-up. Additional history provided: Altered mental status. EXAM: MRI HEAD WITHOUT CONTRAST MRA HEAD WITHOUT CONTRAST TECHNIQUE: Multiplanar, multiecho pulse sequences of the brain and surrounding structures were obtained without intravenous contrast.  Angiographic images of the head were obtained using MRA technique without contrast. COMPARISON:  Head CT 11/17/2019 FINDINGS: MRI HEAD FINDINGS Brain: Multiple sequences are motion degraded. There is a region of cortical/subcortical restricted diffusion within the anterolateral left frontal lobes measuring 5.5 x 3.4 cm in transaxial dimensions, consistent with acute/early subacute infarct. The infarct also extends to involve portions of the lateral left basal ganglia and anterior left insula. Associated T2/FLAIR hyperintensity at the infarct site. There is also SWI signal loss throughout the infarct territory consistent with petechial hemorrhage. Regional mass effect with minimal effacement of the left lateral ventricle frontal horn. No midline shift or extra-axial fluid collection. Background of moderate chronic small vessel ischemic disease. Chronic lacunar infarct within the right cerebellum. Mild generalized parenchymal atrophy. Vascular: Reported separately Skull and upper cervical spine: No focal marrow lesion. Sinuses/Orbits: Visualized orbits demonstrate no acute abnormality. Mild ethmoid sinus mucosal thickening. Small bilateral mastoid effusions. MRA HEAD FINDINGS The intracranial internal carotid arteries are patent. Mild atherosclerotic irregularity of these vessels without significant stenosis. The right middle cerebral artery is patent without significant proximal stenosis. The right anterior cerebral artery is patent. Mild-to-moderate focal stenosis at the origin of the right anterior cerebral artery. The M1 left middle cerebral artery is patent without significant stenosis. High-grade focal stenosis within a superior division proximal M2 left middle cerebral artery branch shortly beyond its origin (series 1018, image 13). There is also segmental near occlusive or occlusive stenosis within a proximal left M2 branch more distally. The left anterior cerebral artery is patent without significant proximal  stenosis. The intracranial vertebral arteries are patent without significant stenosis. The left vertebral artery is developmentally diminutive beyond the origin of the left PICA. The basilar artery is patent without significant stenosis. Predominantly fetal origin of the bilateral posterior cerebral arteries, which are patent without significant proximal stenosis. No intracranial aneurysm is identified. IMPRESSION: MRI brain: 1. Acute/early subacute infarct involving the anterolateral left frontal lobe, lateral left basal ganglia and anterior left insula with associated petechial hemorrhage, as described. 2. Mild associated mass effect with minimal effacement of the left lateral ventricle frontal horn. 3. Generalized parenchymal atrophy with moderate chronic small vessel ischemic disease. Chronic right cerebellar lacunar infarct. MRA head: 1. High-grade focal stenosis within a superior division proximal M2 left MCA branch shortly beyond its origin. There is also segmental near occlusive or occlusive stenosis within a proximal left M2 branch more distally. 2. Moderate focal stenosis at the origin of the right anterior right anterior cerebral artery. 3. Additional intracranial atherosclerotic disease as described with no other significant proximal arterial stenosis identified. Electronically Signed   By: Jackey Loge DO   On: 11/17/2019 08:19   MR Angiogram Neck W or Wo Contrast  Result Date: 11/17/2019 CLINICAL DATA:  Stroke, follow-up. EXAM: MRA NECK WITHOUT AND WITH CONTRAST TECHNIQUE: Multiplanar and multiecho pulse sequences of the neck were obtained without and with intravenous contrast. Angiographic images of the  neck were obtained using MRA technique without and with intravenous contrast. CONTRAST:  7mL GADAVIST GADOBUTROL 1 MMOL/ML IV SOLN COMPARISON:  Same-day MRI/MRA head 11/17/2019 FINDINGS: Standard aortic branching. The visualized aortic arch and proximal major branch vessels are unremarkable. The  bilateral common and internal carotid arteries are patent within the neck without significant stenosis. The vertebral arteries are codominant and patent within the neck without significant stenosis. IMPRESSION: The common carotid, internal carotid and vertebral arteries are patent within the neck without significant stenosis. Electronically Signed   By: Jackey Loge DO   On: 11/17/2019 08:25   MR BRAIN WO CONTRAST  Result Date: 11/17/2019 CLINICAL DATA:  Stroke, follow-up. Additional history provided: Altered mental status. EXAM: MRI HEAD WITHOUT CONTRAST MRA HEAD WITHOUT CONTRAST TECHNIQUE: Multiplanar, multiecho pulse sequences of the brain and surrounding structures were obtained without intravenous contrast. Angiographic images of the head were obtained using MRA technique without contrast. COMPARISON:  Head CT 11/17/2019 FINDINGS: MRI HEAD FINDINGS Brain: Multiple sequences are motion degraded. There is a region of cortical/subcortical restricted diffusion within the anterolateral left frontal lobes measuring 5.5 x 3.4 cm in transaxial dimensions, consistent with acute/early subacute infarct. The infarct also extends to involve portions of the lateral left basal ganglia and anterior left insula. Associated T2/FLAIR hyperintensity at the infarct site. There is also SWI signal loss throughout the infarct territory consistent with petechial hemorrhage. Regional mass effect with minimal effacement of the left lateral ventricle frontal horn. No midline shift or extra-axial fluid collection. Background of moderate chronic small vessel ischemic disease. Chronic lacunar infarct within the right cerebellum. Mild generalized parenchymal atrophy. Vascular: Reported separately Skull and upper cervical spine: No focal marrow lesion. Sinuses/Orbits: Visualized orbits demonstrate no acute abnormality. Mild ethmoid sinus mucosal thickening. Small bilateral mastoid effusions. MRA HEAD FINDINGS The intracranial internal  carotid arteries are patent. Mild atherosclerotic irregularity of these vessels without significant stenosis. The right middle cerebral artery is patent without significant proximal stenosis. The right anterior cerebral artery is patent. Mild-to-moderate focal stenosis at the origin of the right anterior cerebral artery. The M1 left middle cerebral artery is patent without significant stenosis. High-grade focal stenosis within a superior division proximal M2 left middle cerebral artery branch shortly beyond its origin (series 1018, image 13). There is also segmental near occlusive or occlusive stenosis within a proximal left M2 branch more distally. The left anterior cerebral artery is patent without significant proximal stenosis. The intracranial vertebral arteries are patent without significant stenosis. The left vertebral artery is developmentally diminutive beyond the origin of the left PICA. The basilar artery is patent without significant stenosis. Predominantly fetal origin of the bilateral posterior cerebral arteries, which are patent without significant proximal stenosis. No intracranial aneurysm is identified. IMPRESSION: MRI brain: 1. Acute/early subacute infarct involving the anterolateral left frontal lobe, lateral left basal ganglia and anterior left insula with associated petechial hemorrhage, as described. 2. Mild associated mass effect with minimal effacement of the left lateral ventricle frontal horn. 3. Generalized parenchymal atrophy with moderate chronic small vessel ischemic disease. Chronic right cerebellar lacunar infarct. MRA head: 1. High-grade focal stenosis within a superior division proximal M2 left MCA branch shortly beyond its origin. There is also segmental near occlusive or occlusive stenosis within a proximal left M2 branch more distally. 2. Moderate focal stenosis at the origin of the right anterior right anterior cerebral artery. 3. Additional intracranial atherosclerotic disease  as described with no other significant proximal arterial stenosis identified. Electronically Signed   By: Ronaldo Miyamoto  Golden DO   On: 11/17/2019 08:19   DG Chest Port 1 View  Result Date: 11/19/2019 CLINICAL DATA:  85 year old with weakness and altered mental status. EXAM: PORTABLE CHEST 1 VIEW COMPARISON:  Radiograph 11/08/2019 FINDINGS: Low lung volumes. Borderline cardiomegaly unchanged from prior exam. Unchanged mediastinal contours. Coarse interstitial markings particularly at the lung bases, not significantly changed. No new airspace disease. No pleural fluid or pneumothorax. IMPRESSION: No acute findings.Unchanged coarse interstitial markings at the lung bases appear chronic. Electronically Signed   By: Narda Rutherford M.D.   On: 11/12/2019 23:17   ECHOCARDIOGRAM COMPLETE  Result Date: 11/17/2019   ECHOCARDIOGRAM REPORT   Patient Name:   OSWIN JOHAL Date of Exam: 11/17/2019 Medical Rec #:  [id number]     Height:       62.0 in Accession #:    [id number]    Weight:       163.4 lb Date of Birth:  12/01/1926     BSA:          1.75 m Patient Age:    89 years      BP:           169/72 mmHg Patient Gender: F             HR:           75 bpm. Exam Location:  Inpatient Procedure: 2D Echo Indications:    Stroke 434.91 / I163.9  History:        Patient has prior history of Echocardiogram examinations, most                 recent 02/23/2018. CAD, Signs/Symptoms:dementia; Risk                 Factors:Hypertension and Dyslipidemia. Cannot walk or talk at                 this time.  Sonographer:    Leta Jungling RDCS Referring Phys: [id number] [facility] [place]  Sonographer Comments: Unable to turn on her left side. IMPRESSIONS  1. Left ventricular ejection fraction, by visual estimation, is 60 to 65%. The left ventricle has normal function. Left ventricular septal wall thickness was mildly increased. There is no left ventricular hypertrophy.  2. Left ventricular diastolic parameters are consistent with Grade I diastolic  dysfunction (impaired relaxation).  3. Global right ventricle has normal systolic function.The right ventricular size is normal. No increase in right ventricular wall thickness.  4. Left atrial size was normal.  5. Right atrial size was normal.  6. The mitral valve is normal in structure. Mild mitral valve regurgitation. No evidence of mitral stenosis.  7. The tricuspid valve is normal in structure. Tricuspid valve regurgitation is mild.  8. The aortic valve is normal in structure. Aortic valve regurgitation is trivial. No evidence of aortic valve sclerosis or stenosis.  9. The pulmonic valve was normal in structure. Pulmonic valve regurgitation is trivial. 10. Mildly elevated pulmonary artery systolic pressure. 11. The inferior vena cava is normal in size with greater than 50% respiratory variability, suggesting right atrial pressure of 3 mmHg. FINDINGS  Left Ventricle: Left ventricular ejection fraction, by visual estimation, is 60 to 65%. The left ventricle has normal function. The left ventricle is not well visualized. There is no left ventricular hypertrophy. Left ventricular diastolic parameters are consistent with Grade I diastolic dysfunction (impaired relaxation). Normal left atrial pressure. Right Ventricle: The right ventricular size is normal. No increase in right ventricular wall thickness. Global RV  systolic function is has normal systolic function. The tricuspid regurgitant velocity is 2.63 m/s, and with an assumed right atrial pressure  of 3 mmHg, the estimated right ventricular systolic pressure is mildly elevated at 30.7 mmHg. Left Atrium: Left atrial size was normal in size. Right Atrium: Right atrial size was normal in size Pericardium: There is no evidence of pericardial effusion. Mitral Valve: The mitral valve is normal in structure. Mild mitral valve regurgitation. No evidence of mitral valve stenosis by observation. Tricuspid Valve: The tricuspid valve is normal in structure. Tricuspid valve  regurgitation is mild. Aortic Valve: The aortic valve is normal in structure. Aortic valve regurgitation is trivial. The aortic valve is structurally normal, with no evidence of sclerosis or stenosis. Pulmonic Valve: The pulmonic valve was normal in structure. Pulmonic valve regurgitation is trivial. Pulmonic regurgitation is trivial. Aorta: The aortic root, ascending aorta and aortic arch are all structurally normal, with no evidence of dilitation or obstruction. Venous: The inferior vena cava is normal in size with greater than 50% respiratory variability, suggesting right atrial pressure of 3 mmHg. IAS/Shunts: No atrial level shunt detected by color flow Doppler. There is no evidence of a patent foramen ovale. No ventricular septal defect is seen or detected. There is no evidence of an atrial septal defect.  LEFT VENTRICLE PLAX 2D LVIDd:         4.50 cm  Diastology LVIDs:         2.90 cm  LV e' lateral:   5.11 cm/s LV PW:         1.00 cm  LV E/e' lateral: 12.4 LV IVS:        1.20 cm  LV e' medial:    5.87 cm/s LVOT diam:     1.60 cm  LV E/e' medial:  10.8 LV SV:         60 ml LV SV Index:   32.98 LVOT Area:     2.01 cm  RIGHT VENTRICLE RV S prime:     20.20 cm/s TAPSE (M-mode): 2.8 cm LEFT ATRIUM             Index LA diam:        3.00 cm 1.71 cm/m LA Vol (A2C):   48.0 ml 27.36 ml/m LA Vol (A4C):   49.4 ml 28.16 ml/m LA Biplane Vol: 50.0 ml 28.50 ml/m  AORTIC VALVE LVOT Vmax:   76.40 cm/s LVOT Vmean:  46.200 cm/s LVOT VTI:    0.132 m  AORTA Ao Root diam: 2.30 cm Ao Asc diam:  3.10 cm MITRAL VALVE                         TRICUSPID VALVE MV Area (PHT): 2.22 cm              TR Peak grad:   27.7 mmHg MV PHT:        99.04 msec            TR Vmax:        263.00 cm/s MV Decel Time: 342 msec MV E velocity: 63.60 cm/s  103 cm/s  SHUNTS MV A velocity: 102.00 cm/s 70.3 cm/s Systemic VTI:  0.13 m MV E/A ratio:  0.62        1.5       Systemic Diam: 1.60 cm  Armanda Magic MD Electronically signed by Armanda Magic MD  Signature Date/Time: 11/17/2019/11:24:48 AM    Final    VAS [place] LOWER  EXTREMITY VENOUS (DVT)  Result Date: 11/18/2019  Lower Venous Study Indications: Stroke.  Limitations: Memory impaired/ confusion. Performing Technologist: Jeb Levering RDMS, RVT  Examination Guidelines: A complete evaluation includes B-mode imaging, spectral Doppler, color Doppler, and power Doppler as needed of all accessible portions of each vessel. Bilateral testing is considered an integral part of a complete examination. Limited examinations for reoccurring indications may be performed as noted.  +---------+---------------+---------+-----------+----------+--------------+ RIGHT    CompressibilityPhasicitySpontaneityPropertiesThrombus Aging +---------+---------------+---------+-----------+----------+--------------+ CFV      Full           Yes      Yes                                 +---------+---------------+---------+-----------+----------+--------------+ SFJ      Full                                                        +---------+---------------+---------+-----------+----------+--------------+ FV Prox  Full                                                        +---------+---------------+---------+-----------+----------+--------------+ FV Mid   Full                                                        +---------+---------------+---------+-----------+----------+--------------+ FV DistalFull                                                        +---------+---------------+---------+-----------+----------+--------------+ PFV      Full                                                        +---------+---------------+---------+-----------+----------+--------------+ POP      Full           Yes      Yes                                 +---------+---------------+---------+-----------+----------+--------------+ PTV                                                   Not visualized  +---------+---------------+---------+-----------+----------+--------------+ PERO  Not visualized +---------+---------------+---------+-----------+----------+--------------+ Not all segments well visualized due to leg turned medially/ patient movement  +---------+---------------+---------+-----------+----------+--------------+ LEFT     CompressibilityPhasicitySpontaneityPropertiesThrombus Aging +---------+---------------+---------+-----------+----------+--------------+ CFV      Full           Yes      Yes                                 +---------+---------------+---------+-----------+----------+--------------+ SFJ      Full                                                        +---------+---------------+---------+-----------+----------+--------------+ FV Prox  Full                                                        +---------+---------------+---------+-----------+----------+--------------+ FV Mid   Full                                                        +---------+---------------+---------+-----------+----------+--------------+ FV DistalFull                                                        +---------+---------------+---------+-----------+----------+--------------+ PFV      Full                                                        +---------+---------------+---------+-----------+----------+--------------+ POP      Full           Yes      Yes                                 +---------+---------------+---------+-----------+----------+--------------+ PTV      Full                                                        +---------+---------------+---------+-----------+----------+--------------+ PERO     Full                                                        +---------+---------------+---------+-----------+----------+--------------+     Summary: Right: There is no evidence of  deep vein thrombosis in the lower extremity. However, portions of this examination were limited- see technologist comments above. No  cystic structure found in the popliteal fossa. Left: There is no evidence of deep vein thrombosis in the lower extremity. No cystic structure found in the popliteal fossa.  *See table(s) above for measurements and observations.    Preliminary     PHYSICAL EXAM  Temp:  [97.9 F (36.6 C)-98.5 F (36.9 C)] 98.5 F (36.9 C) (12/19 1112) Pulse Rate:  [71-94] 80 (12/19 1112) Resp:  [18-30] 18 (12/19 0402) BP: (138-174)/(58-92) 164/63 (12/19 1112) SpO2:  [92 %-97 %] 96 % (12/19 1112) Weight:  [66.6 kg] 66.6 kg (12/18 2357)  General - Well nourished, well developed, in no apparent distress, lethargic.  Ophthalmologic - fundi not visualized due to noncooperation.  Cardiovascular - Regular rhythm and rate.  Neuro - lethargic and eyes closed, however easily open with stimulation.  Global aphasia, no speech output, not following commands.  Left gaze preference, barely cross midline to the right.  Blinking to visual threat on the left and right lower quadrant, not right upper quadrant.  Not tracking on the right.  PERRL.  Right facial droop.  Tongue protrusion not corporative.  Bilateral upper extremity 3/5, symmetrical, bilateral lower extremity 3/5 with pain stimulation.  DTR 1+, no Babinski. Sensation, coordination and gait not tested.    ASSESSMENT/PLAN [name] is a 85 y.o. female with history of  dementia, coronary artery disease, hypertension hyperlipidemia presenting from [facility] with multiple days of lethargy, confusion and not acting herself..   Stroke:  left MCA moderate infarct in setting of L M2 stenosis, could be large vessel disease but cannot rule out cardioembolic source CT head acute/subacute L frontal lobe infarct. Dense L M2. Small vessel disease. Moderate Atrophy.   MRI  Anterolateral L frontal lobe, L basal ganglia and anterior L  insular infarcts w/ hemorrhagic transformation. Mild mass effect and effacements L lateral ventricle. Small vessel disease. Atrophy. Old R cerebellar infarct    MRA head  High-grade superior proximal L M2 branch stenosis, near-occlusive L M2 distal stenosis. Moderate R ACA stenosis. Intracranial atherosclerosis.  MRA neck Unremarkable   2D Echo EF 60-65%. No source of embolus   LE venous doppler pending  May consider 30 day cardiac event monitoring as outpt to rule out afib if above work up negative  LDL 59  HgbA1c 6.9  Lovenox 40 mg sq daily for VTE prophylaxis  clopidogrel 75 mg daily prior to admission, now on aspirin 300 mg suppository daily. Consider DAPT once po access  Therapy recommendations:  pending   Disposition:  pending   Dysphagia  Did not pass swallow  NPO  Put on IVF @ 75  Will place cortrack  Consider po meds once po access  Consider TF once cortrak placed  Hypertension  Stable . Permissive hypertension (OK if < 220/120) but gradually normalize in 3-5 days . BP within range now . Long-term BP goal normotensive  Hyperlipidemia  Home meds:  lipitor 20  Resume statin in hospital once po access  LDL 59, goal < 70  Continue statin at discharge  Likely Pre-Diabetes  HgbA1c 6.9, goal < 7.0  SSI  CBG monitoring  Other Stroke Risk Factors  Advanced age  Coronary artery disease s/p PTCA  Hx ischemic cardiomyopathy - EF 30% in 2002 currently EF 60--65%  Other Active Problems  Baseline dementia  Slightly elevated troponin 22  Hypokalemia 3.3 - supplement  Mild lactic acidosis   Hospital day # 1 Await cortrack tube placement then start tube feeds . Dietician consult appreciated.  No family at bedside. I have spent a total of 25   minutes with the patient reviewing hospital notes,  test results, labs and examining the patient as well as establishing an assessment and plan that was discussed personally with the patient.  > 50% of  time was spent in direct patient care.  Delia HeadyPramod Asli Tokarski, MD    To contact Stroke Continuity provider, please refer to [email or web address]. After hours, contact General Neurology

## 2019-11-19 LAB — BASIC METABOLIC PANEL
Anion gap: 13 (ref 5–15)
BUN: 13 mg/dL (ref 8–23)
CO2: 20 mmol/L — ABNORMAL LOW (ref 22–32)
Calcium: 8.4 mg/dL — ABNORMAL LOW (ref 8.9–10.3)
Chloride: 113 mmol/L — ABNORMAL HIGH (ref 98–111)
Creatinine, Ser: 0.76 mg/dL (ref 0.44–1.00)
GFR calc Af Amer: >60 mL/min (ref >60)
GFR calc non Af Amer: >60 mL/min (ref >60)
Glucose, Bld: 133 mg/dL — ABNORMAL HIGH (ref 70–99)
Potassium: 3 mmol/L — ABNORMAL LOW (ref 3.5–5.1)
Sodium: 146 mmol/L — ABNORMAL HIGH (ref 135–145)

## 2019-11-19 LAB — CBC WITH DIFFERENTIAL/PLATELET
Abs Immature Granulocytes: 0.17 10*3/uL — ABNORMAL HIGH (ref 0.00–0.07)
Basophils Absolute: 0 10*3/uL (ref 0.0–0.1)
Basophils Relative: 0 %
Eosinophils Absolute: 0 10*3/uL (ref 0.0–0.5)
Eosinophils Relative: 0 %
HCT: 45.4 % (ref 36.0–46.0)
Hemoglobin: 14.5 g/dL (ref 12.0–15.0)
Immature Granulocytes: 2 %
Lymphocytes Relative: 20 %
Lymphs Abs: 2.1 10*3/uL (ref 0.7–4.0)
MCH: 29.2 pg (ref 26.0–34.0)
MCHC: 31.9 g/dL (ref 30.0–36.0)
MCV: 91.5 fL (ref 80.0–100.0)
Monocytes Absolute: 1.6 10*3/uL — ABNORMAL HIGH (ref 0.1–1.0)
Monocytes Relative: 15 %
Neutro Abs: 6.7 10*3/uL (ref 1.7–7.7)
Neutrophils Relative %: 63 %
Platelets: 293 10*3/uL (ref 150–400)
RBC: 4.96 MIL/uL (ref 3.87–5.11)
RDW: 15.3 % (ref 11.5–15.5)
WBC: 10.7 10*3/uL — ABNORMAL HIGH (ref 4.0–10.5)
nRBC: 0 % (ref 0.0–0.2)

## 2019-11-19 LAB — SARS CORONAVIRUS 2 (TAT 6-24 HRS): SARS Coronavirus 2: NEGATIVE

## 2019-11-19 MED ORDER — ASPIRIN EC 81 MG PO TBEC
81.0000 mg | DELAYED_RELEASE_TABLET | Freq: Every day | ORAL | Status: DC
  Administered 2019-11-19 – 2019-11-20 (×2): 81 mg via ORAL
  Filled 2019-11-19 (×2): qty 1

## 2019-11-19 MED ORDER — POTASSIUM CHLORIDE 10 MEQ/100ML IV SOLN
10.0000 meq | INTRAVENOUS | Status: AC
  Administered 2019-11-19 (×4): 10 meq via INTRAVENOUS
  Filled 2019-11-19 (×4): qty 100

## 2019-11-19 MED ORDER — CLOPIDOGREL BISULFATE 75 MG PO TABS
75.0000 mg | ORAL_TABLET | Freq: Every day | ORAL | Status: DC
  Administered 2019-11-19 – 2019-11-20 (×2): 75 mg via ORAL
  Filled 2019-11-19 (×2): qty 1

## 2019-11-19 NOTE — Evaluation (Signed)
Physical Therapy Evaluation Patient Details Name: SHANTOYA GEURTS MRN: [id number] DOB: Feb 07, 1927 Today's Date: 11/19/2019   History of Present Illness  Pt is a 85 yr old who presneted with AMS and found to have acute subacute infart with an anterolateral L frontal lobe, lateral L basal ganglia and anterior L insula. PMH but not limited to dementia, CAD, anxiety, depression, vertigo, HTN     Clinical Impression  Pt presents with profound limitations to mobility due to weakness, decreased power, poor motor initiation, limited communication and cognition all resulting in dependencies in moving around and changing positions in bed, and inability to stand or maintain standing even with maximal assist.  Recommend SNF for postacute rehab to address deficits with goal of restoring independence for mobility.  PT will follow acutely and initiate care.    Follow Up Recommendations SNF;Supervision/Assistance - 24 hour    Equipment Recommendations  None recommended by PT(TBD or at SNF)    Recommendations for Other Services       Precautions / Restrictions Precautions Precautions: Fall Precaution Comments: bed alarm, up with assist only      Mobility  Bed Mobility Overal bed mobility: Needs Assistance Bed Mobility: Rolling;Supine to Sit;Sit to Supine Rolling: Max assist   Supine to sit: Mod assist Sit to supine: Max assist   General bed mobility comments: when cued, pt does initiate movement to EOB but unable to make progress unaided.  unable to reposition legs onto bed without max assist; rolling R/L with assist to position hand at rail and bend knee for movement  Transfers Overall transfer level: Needs assistance Equipment used: 1 person hand held assist Transfers: Sit to/from Stand Sit to Stand: Max assist         General transfer comment: face to face guarding, max assist to lift off bed surface and minimal engagement of quads to extend knees (better 2nd of 2 trials); pt fatigued  at this point, and returned to bed for session end  Ambulation/Gait             General Gait Details: lacks strength to perform standing, wt shifting at this time  Stairs            Wheelchair Mobility    Modified Rankin (Stroke Patients Only) Modified Rankin (Stroke Patients Only) Pre-Morbid Rankin Score: Moderate disability Modified Rankin: Severe disability     Balance Overall balance assessment: Needs assistance Sitting-balance support: No upper extremity supported;Feet supported Sitting balance-Leahy Scale: Fair Sitting balance - Comments: difficulty maintaining balance for prolonged time   Standing balance support: During functional activity;No upper extremity supported Standing balance-Leahy Scale: Zero Standing balance comment: lacks limb strength to stand                             Pertinent Vitals/Pain Pain Assessment: Faces Faces Pain Scale: No hurt    Home Living Family/patient expects to be discharged to:: Skilled nursing facility                 Additional Comments: per chart pt is from SNF, therefore expect to return there at d/c    Prior Function Level of Independence: Needs assistance   Gait / Transfers Assistance Needed: "normally ambulatory" per chart  ADL's / Homemaking Assistance Needed: unknown at this time        Hand Dominance        Extremity/Trunk Assessment   Upper Extremity Assessment Upper Extremity Assessment: Defer to OT  evaluation;Generalized weakness    Lower Extremity Assessment Lower Extremity Assessment: Generalized weakness    Cervical / Trunk Assessment Cervical / Trunk Assessment: Normal  Communication   Communication: Expressive difficulties;Receptive difficulties(some comprehension but supplemented by gestures)  Cognition Arousal/Alertness: Lethargic Behavior During Therapy: Flat affect Overall Cognitive Status: Difficult to assess                                  General Comments: initially difficulty to arouse but does with repeated vocal cues and firm tactile stim to shoulder; pt does make eye contact with vocal cues and occasionally smiles appropriately; attempts to follow commands (supplemented with gestural cues) and drifts off when unstimulated; fatigues quickly and falls back to sleep at end of sesson      General Comments General comments (skin integrity, edema, etc.): became distracted by IV in left forarm and pulled at it but easily redirected.  VSS not indication of distress    Exercises     Assessment/Plan    PT Assessment Patient needs continued PT services  PT Problem List Decreased strength;Decreased activity tolerance;Decreased balance;Decreased mobility;Decreased cognition;Decreased knowledge of use of DME;Decreased safety awareness;Decreased knowledge of precautions       PT Treatment Interventions Wheelchair mobility training;Patient/family education;Cognitive remediation;Neuromuscular re-education;Balance training;Therapeutic exercise;Therapeutic activities;Functional mobility training;DME instruction;Gait training    PT Goals (Current goals can be found in the Care Plan section)  Acute Rehab PT Goals Patient Stated Goal: none reported PT Goal Formulation: Patient unable to participate in goal setting Time For Goal Achievement: 12/03/19 Potential to Achieve Goals: Fair    Frequency Min 3X/week   Barriers to discharge        Co-evaluation               AM-PAC PT "6 Clicks" Mobility  Outcome Measure Help needed turning from your back to your side while in a flat bed without using bedrails?: A Lot Help needed moving from lying on your back to sitting on the side of a flat bed without using bedrails?: A Lot Help needed moving to and from a bed to a chair (including a wheelchair)?: Total Help needed standing up from a chair using your arms (e.g., wheelchair or bedside chair)?: A Lot Help needed to walk in hospital  room?: Total Help needed climbing 3-5 steps with a railing? : Total 6 Click Score: 9    End of Session   Activity Tolerance: Patient limited by fatigue;Patient limited by lethargy Patient left: in bed;with call bell/phone within reach;with bed alarm set Nurse Communication: Mobility status PT Visit Diagnosis: Muscle weakness (generalized) (M62.81);Other abnormalities of gait and mobility (R26.89)    Time: 1001-1038 PT Time Calculation (min) (ACUTE ONLY): 37 min   Charges:   PT Evaluation $PT Eval Moderate Complexity: 1 Mod PT Treatments $Therapeutic Activity: 8-22 mins $Neuromuscular Re-education: 8-22 mins        Narda Amber, PT, DPT, MS Board Certified Geriatric Clinical Specialist   Narda Amber [facility] 11/19/2019, 11:00 AM

## 2019-11-19 NOTE — NC FL2 (Signed)
[place] MEDICAID FL2 LEVEL OF CARE SCREENING TOOL     IDENTIFICATION  Patient Name: [name] Birthdate: [birth year] Sex: female Admission Date (Current Location): Nov 24, 2019  Csf - [place] and [place] Number:  Producer, television/film/video and Address:  [facility], [address], [place], [place] [address]      Provider Number: [id number]  Attending Physician Name and Address:  Fran Lowes, DO  Relative Name and Phone Number:  Laddie Aquas, Daughter, [phone]    Current Level of Care: Hospital Recommended Level of Care: Skilled Nursing Facility Prior Approval Number:    Date Approved/Denied:   PASRR Number: Under Manual Review  Discharge Plan: SNF    Current Diagnoses: Patient Active Problem List   Diagnosis Date Noted  . Acute CVA (cerebrovascular accident) (HCC) 11/17/2019  . Lactic acidosis 11/17/2019  . Elevated troponin 11/17/2019  . Hypokalemia 11/17/2019  . Cough 12/06/2018  . Low back pain 03/13/2015  . NSTEMI (non-ST elevated myocardial infarction) (HCC) 05/26/2014  . Acute URI 03/06/2014  . Pneumonia involving right lung 03/06/2014  . Anogenital lichen sclerosus 11/01/2013  . Well adult exam 05/11/2013  . Chills 04/04/2013  . Chronic low back pain 05/30/2012  . Grief reaction 07/15/2011  . Fatigue 04/21/2011  . Dizzy spells 04/21/2011  . Sinusitis, chronic 04/21/2011  . ALLERGIC RHINITIS 02/16/2011  . Alzheimer's dementia (HCC) 11/01/2010  . Anxiety state 10/28/2010  . ACTINIC KERATOSIS 10/28/2010  . Cystitis 08/22/2009  . HERPES ZOSTER 05/23/2009  . Situational depression 12/05/2008  . VERTIGO 04/16/2008  . Memory loss 04/16/2008  . Dyslipidemia 10/25/2007  . Hypertension 10/25/2007  . DIVERTICULOSIS, COLON 10/25/2007  . Coronary atherosclerosis 09/26/2007  . OSTEOPOROSIS 09/26/2007    Orientation RESPIRATION BLADDER Height & Weight     Self  Normal Incontinent Weight: 146 lb 13.2 oz (66.6 kg) Height:      BEHAVIORAL SYMPTOMS/MOOD NEUROLOGICAL BOWEL NUTRITION STATUS      Incontinent Diet(Dysphasia diet, thin liquids)  AMBULATORY STATUS COMMUNICATION OF NEEDS Skin   Extensive Assist Verbally Normal                       Personal Care Assistance Level of Assistance  Bathing, Feeding, Dressing, Total care Bathing Assistance: Maximum assistance Feeding assistance: Limited assistance Dressing Assistance: Maximum assistance Total Care Assistance: Maximum assistance   Functional Limitations Info  Sight, Hearing, Speech Sight Info: Impaired(Wears glasses) Hearing Info: Adequate Speech Info: Impaired([place], currently not speaking)    SPECIAL CARE FACTORS FREQUENCY  PT (By licensed PT), OT (By licensed OT), Speech therapy     PT Frequency: 5x/wk OT Frequency: 5x/wk     Speech Therapy Frequency: 2x/wk      Contractures Contractures Info: Not present    Additional Factors Info  Code Status, Allergies Code Status Info: DNR Allergies Info: Simvastatin           Current Medications (11/19/2019):  This is the current hospital active medication list Current Facility-Administered Medications  Medication Dose Route Frequency Provider Last Rate Last Admin  .  stroke: mapping our early stages of recovery book   Does not apply Once John Giovanni, MD      . 0.9 %  sodium chloride infusion   Intravenous Continuous Marvel Plan, MD 75 mL/hr at 11/18/19 2341 New Bag at 11/18/19 2341  . acetaminophen (TYLENOL) tablet 650 mg  650 mg Oral Q4H PRN John Giovanni, MD       Or  . acetaminophen (TYLENOL) 160  MG/5ML solution 650 mg  650 mg Per Tube Q4H PRN Shela Leff, MD       Or  . acetaminophen (TYLENOL) suppository 650 mg  650 mg Rectal Q4H PRN Shela Leff, MD      . aspirin EC tablet 81 mg  81 mg Oral Daily Antony Contras S, MD      . clopidogrel (PLAVIX) tablet 75 mg  75 mg Oral Daily Antony Contras S, MD      . enoxaparin (LOVENOX) injection 40 mg  40 mg Subcutaneous  Daily Shela Leff, MD   40 mg at 11/19/19 4944  . hydrALAZINE (APRESOLINE) injection 5 mg  5 mg Intravenous Q4H PRN Shela Leff, MD   5 mg at 11/18/19 2352  . potassium chloride 10 mEq in 100 mL IVPB  10 mEq Intravenous Q1 Hr x 4 Rinehuls, David L, PA-C 100 mL/hr at 11/19/19 1401 10 mEq at 11/19/19 1401  . senna-docusate (Senokot-S) tablet 1 tablet  1 tablet Oral QHS PRN Shela Leff, MD         Discharge Medications: Please see discharge summary for a list of discharge medications.  Relevant Imaging Results:  Relevant Lab Results:   Additional Information SSN: [id number]; COVID negative on 11/17/2019  Kohan Azizi B Vaanya Shambaugh, LCSWA

## 2019-11-19 NOTE — Progress Notes (Signed)
STROKE TEAM PROGRESS NOTE   INTERVAL HISTORY Pt lying in bed, globalyy aphasic, left gaze preference, not following commands but moving all extremities symmetrically.   Didpass swallow,  .Vitals stable. No changes White count trending up.  Potassium remains low getting supplementation  Vitals:   11/18/19 2347 11/19/19 0348 11/19/19 0925 11/19/19 1204  BP: (!) 169/69 (!) 152/91 (!) 173/83 (!) 144/109  Pulse: 81 88 88 84  Resp: 16 18    Temp: 98.2 F (36.8 C) 98.5 F (36.9 C) 98.7 F (37.1 C) 97.7 F (36.5 C)  TempSrc: Oral Oral Oral Oral  SpO2: 95% 94% 96% 96%  Weight:        CBC:  Recent Labs  Lab 11/18/19 0247 11/19/19 0547  WBC 9.0 10.7*  NEUTROABS 5.5 6.7  HGB 14.6 14.5  HCT 45.7 45.4  MCV 91.6 91.5  PLT 259 293    Basic Metabolic Panel:  Recent Labs  Lab 11/17/19 0210 11/18/19 0247 11/19/19 0547  NA  --  147* 146*  K  --  2.9* 3.0*  CL  --  112* 113*  CO2  --  21* 20*  GLUCOSE  --  138* 133*  BUN  --  16 13  CREATININE  --  0.94 0.76  CALCIUM  --  8.4* 8.4*  MG 2.6*  --   --    Lipid Panel:     Component Value Date/Time   CHOL 139 11/17/2019 0341   TRIG 272 (H) 11/17/2019 0341   HDL 26 (L) 11/17/2019 0341   CHOLHDL 5.3 11/17/2019 0341   VLDL 54 (H) 11/17/2019 0341   LDLCALC 59 11/17/2019 0341   HgbA1c:  Lab Results  Component Value Date   HGBA1C 6.9 (H) 11/17/2019   Urine Drug Screen:     Component Value Date/Time   LABOPIA NONE DETECTED 11/17/2019 0213   COCAINSCRNUR NONE DETECTED 11/17/2019 0213   LABBENZ NONE DETECTED 11/17/2019 0213   AMPHETMU NONE DETECTED 11/17/2019 0213   THCU NONE DETECTED 11/17/2019 0213   LABBARB NONE DETECTED 11/17/2019 0213    Alcohol Level     Component Value Date/Time   ETH <10 11/17/2019 0210    IMAGING  MR ANGIO HEAD WO CONTRAST 11/17/2019 IMPRESSION:   MRI brain:  1. Acute/early subacute infarct involving the anterolateral left frontal lobe, lateral left basal ganglia and anterior left insula  with associated petechial hemorrhage, as described.  2. Mild associated mass effect with minimal effacement of the left lateral ventricle frontal horn.  3. Generalized parenchymal atrophy with moderate chronic small vessel ischemic disease. Chronic right cerebellar lacunar infarct.   MRA head:  1. High-grade focal stenosis within a superior division proximal M2 left MCA branch shortly beyond its origin. There is also segmental near occlusive or occlusive stenosis within a proximal left M2 branch more distally.  2. Moderate focal stenosis at the origin of the right anterior right anterior cerebral artery.  3. Additional intracranial atherosclerotic disease as described with no other significant proximal arterial stenosis identified.  MR Angiogram Neck W or Wo Contrast 11/17/2019 IMPRESSION:  The common carotid, internal carotid and vertebral arteries are patent within the neck without significant stenosis  ECHOCARDIOGRAM COMPLETE 11/17/2019 IMPRESSIONS   1. Left ventricular ejection fraction, by visual estimation, is 60 to 65%. The left ventricle has normal function. Left ventricular septal wall thickness was mildly increased. There is no left ventricular hypertrophy.   2. Left ventricular diastolic parameters are consistent with Grade I diastolic dysfunction (impaired relaxation).  3. Global right ventricle has normal systolic function.The right ventricular size is normal. No increase in right ventricular wall thickness.   4. Left atrial size was normal.   5. Right atrial size was normal.   6. The mitral valve is normal in structure. Mild mitral valve regurgitation. No evidence of mitral stenosis.   7. The tricuspid valve is normal in structure. Tricuspid valve regurgitation is mild.   8. The aortic valve is normal in structure. Aortic valve regurgitation is trivial. No evidence of aortic valve sclerosis or stenosis.   9. The pulmonic valve was normal in structure. Pulmonic valve  regurgitation is trivial.  10. Mildly elevated pulmonary artery systolic pressure. 1 1. The inferior vena cava is normal in size with greater than 50% respiratory variability, suggesting right atrial pressure of 3 mmHg.     VAS [place] LOWER EXTREMITY VENOUS (DVT) 11/18/2019 Summary:  Right: There is no evidence of deep vein thrombosis in the lower extremity. However, portions of this examination were limited- see technologist comments above. No cystic structure found in the popliteal fossa.  Left: There is no evidence of deep vein thrombosis in the lower extremity. No cystic structure found in the popliteal fossa.    PHYSICAL EXAM  Temp:  [97.7 F (36.5 C)-99.1 F (37.3 C)] 97.7 F (36.5 C) (12/20 1204) Pulse Rate:  [79-88] 84 (12/20 1204) Resp:  [16-18] 18 (12/20 0348) BP: (144-173)/(68-109) 144/109 (12/20 1204) SpO2:  [94 %-96 %] 96 % (12/20 1204)  General - Well nourished, well developed, in no apparent distress, lethargic.  Ophthalmologic - fundi not visualized due to noncooperation.  Cardiovascular - Regular rhythm and rate.  Neuro - lethargic and eyes closed, however easily open with stimulation.  Global aphasia, no speech output, not following commands.  Left gaze preference, barely cross midline to the right.  Blinking to visual threat on the left and right lower quadrant, not right upper quadrant.  Not tracking on the right.  PERRL.  Right facial droop.  Tongue protrusion not corporative.  Bilateral upper extremity 3/5, symmetrical, bilateral lower extremity 3/5 with pain stimulation.  DTR 1+, no Babinski. Sensation, coordination and gait not tested.    ASSESSMENT/PLAN [name] is a 85 y.o. female with history of  dementia, coronary artery disease, hypertension hyperlipidemia presenting from [facility] with multiple days of lethargy, confusion and not acting herself.  Stroke:  left MCA moderate infarct in setting of L M2 stenosis, could be large vessel disease  but cannot rule out cardioembolic source CT head acute/subacute L frontal lobe infarct. Dense L M2. Small vessel disease. Moderate Atrophy.   MRI  Anterolateral L frontal lobe, L basal ganglia and anterior L insular infarcts w/ hemorrhagic transformation. Mild mass effect and effacements L lateral ventricle. Small vessel disease. Atrophy. Old R cerebellar infarct    MRA head  High-grade superior proximal L M2 branch stenosis, near-occlusive L M2 distal stenosis. Moderate R ACA stenosis. Intracranial atherosclerosis.  MRA neck Unremarkable   2D Echo EF 60-65%. No source of embolus   LE venous doppler - negative for DVT  May consider 30 day cardiac event monitoring as outpt to rule out afib if above work up negative  LDL 59  HgbA1c 6.9  Lovenox 40 mg sq daily for VTE prophylaxis  clopidogrel 75 mg daily prior to admission, now on aspirin 300 mg suppository daily.  Start DAPT now that she is able to swallow  Therapy recommendations:  SNF  Disposition:  pending  Dysphagia  Did not pass swallow  NPO  Put on IVF @ 77  had cortrak -now able to swallow consider po meds once po access  Consider TF once cortrak placed  Hypertension  Stable . Permissive hypertension (OK if < 220/120) but gradually normalize in 3-5 days . BP within range now . Long-term BP goal normotensive  Hyperlipidemia  Home meds:  lipitor 20  Resume statin in hospital once po access  LDL 59, goal < 70  Continue statin at discharge  Likely Pre-Diabetes  HgbA1c 6.9, goal < 7.0  SSI  CBG monitoring  Other Stroke Risk Factors  Advanced age  Coronary artery disease s/p PTCA  Hx ischemic cardiomyopathy - EF 30% in 2002 currently EF 60--65%  Other Active Problems  Baseline dementia  Slightly elevated troponin 22->22 insignificant  Hypokalemia 3.3 - supplement - 3.0 - supplement further and recheck in AM  Mild lactic acidosis   WBC - 10.7 ; Temp - 99.1 (U/A and CXR 12/18 ->  OK)  DNR  Hospital day # 2 Watch p.o. intake to ensure that it is adequate.  Replace potassium.  Expect transfer to skilled nursing facility if condition improves over the next few days appreciate help from medical hospitalist Dr.Swayze  Delia Heady, MD  To contact Stroke Continuity provider, please refer to [email or web address]. After hours, contact General Neurology

## 2019-11-19 NOTE — TOC Initial Note (Signed)
Transition of Care [facility]) - Initial/Assessment Note    Patient Details  Name: [name] MRN: [id number] Date of Birth: 1927-07-24  Transition of Care [facility]) CM/SW Contact:    Gelene Mink, [place] Phone Number: 11/19/2019, 3:19 PM  Clinical Narrative:                  CSW called and spoke with the patient's daughter, Bailey Mech. CSW introduced herself, explained her role, and shared therapy recommendation. The patient is from [facility]. Patient's daughter is not happy with them at this moment. She is agreeable to rehab. CSW explained SNF process and answered questions. Robyn didn't have a preference of SNF's.  CSW obtained permission to fax the patient and provide bed offers. CSW provided her a copy of the CMS SNF list via email.   CSW will continue to follow and assist with disposition planning.   Expected Discharge Plan: Skilled Nursing Facility Barriers to Discharge: Continued Medical Work up, Ship broker   Patient Goals and CMS Choice Patient states their goals for this hospitalization and ongoing recovery are:: Pt daughter agreeable to SNF CMS Medicare.gov Compare Post Acute Care list provided to:: Patient Represenative (must comment) Choice offered to / list presented to : Adult Children  Expected Discharge Plan and Services Expected Discharge Plan: [place] In-house Referral: Clinical Social Work Discharge Planning Services: NA Post Acute Care Choice: [place] Living arrangements for the past 2 months: [place]                 DME Arranged: N/A         HH Arranged: NA [place] Agency: NA        Prior Living Arrangements/Services Living arrangements for the past 2 months: [place] Lives with:: Facility Resident Patient language and need for interpreter reviewed:: No Do you feel safe going back to the place where you live?: No   Pt daughter wants her mother to receive rehab  Need for  Family Participation in Patient Care: Yes (Comment) Care giver support system in place?: Yes (comment)   Criminal Activity/Legal Involvement Pertinent to Current Situation/Hospitalization: No - Comment as needed  Activities of Daily Living      Permission Sought/Granted Permission sought to share information with : Case Manager Permission granted to share information with : Yes, Verbal Permission Granted     Permission granted to share info w AGENCY: All SNF        Emotional Assessment Appearance:: Appears stated age Attitude/Demeanor/Rapport: Unable to Assess Affect (typically observed): Unable to Assess Orientation: : Oriented to Self Alcohol / Substance Use: Not Applicable Psych Involvement: No (comment)  Admission diagnosis:  Acute CVA (cerebrovascular accident) ([place]) [I63.9] Altered mental status, unspecified altered mental status type [R41.82] Cerebrovascular accident (CVA), unspecified mechanism ([place]) [I63.9] Patient Active Problem List   Diagnosis Date Noted  . Acute CVA (cerebrovascular accident) ([place]) 11/17/2019  . Lactic acidosis 11/17/2019  . Elevated troponin 11/17/2019  . Hypokalemia 11/17/2019  . Cough 12/06/2018  . Low back pain 03/13/2015  . NSTEMI (non-ST elevated myocardial infarction) ([place]) 05/26/2014  . Acute URI 03/06/2014  . Pneumonia involving right lung 03/06/2014  . Anogenital lichen sclerosus 50/07/3817  . Well adult exam 05/11/2013  . Chills 04/04/2013  . Chronic low back pain 05/30/2012  . Grief reaction 07/15/2011  . Fatigue 04/21/2011  . Dizzy spells 04/21/2011  . Sinusitis, chronic 04/21/2011  . ALLERGIC RHINITIS 02/16/2011  . Alzheimer's dementia ([place]) 11/01/2010  .  Anxiety state 10/28/2010  . ACTINIC KERATOSIS 10/28/2010  . Cystitis 08/22/2009  . HERPES ZOSTER 05/23/2009  . Situational depression 12/05/2008  . VERTIGO 04/16/2008  . Memory loss 04/16/2008  . Dyslipidemia 10/25/2007  . Hypertension 10/25/2007  . DIVERTICULOSIS,  COLON 10/25/2007  . Coronary atherosclerosis 09/26/2007  . OSTEOPOROSIS 09/26/2007   PCP:  Tresa Garter, MD Pharmacy:  No Pharmacies Listed    Social Determinants of Health (SDOH) Interventions    Readmission Risk Interventions No flowsheet data found.

## 2019-11-19 NOTE — Progress Notes (Signed)
PROGRESS NOTE  [name]:[id number] DOB: 01/09/1927 DOA: 11/01/2019 PCP: Tresa Garter, MD  Brief History   [name] is a 85 y.o. female with medical history significant of dementia, CAD status post PCI, ischemic cardiomyopathy, hypertension, hyperlipidemia, osteoporosis depression, anxiety presenting to the ED from her nursing home for evaluation of altered mental status.  Staff reported that patient has not been acting like herself for the past 1.5 days.  She is normally ambulatory but will not walk or talk at this time.  She was recently treated for UTI and just finished taking Keflex.  Patient is nonverbal and no history could be obtained from her.  ED Course: Afebrile and no leukocytosis.  Lactic acid 2.2.  Blood glucose 153.  Creatinine 1.2, baseline 0.9-1.2.  Rapid SARS-CoV-2 antigen test negative, PCR test pending.  UA and urine culture pending.  Blood culture x2 pending.  High-sensitivity troponin 22. UDS and blood ethanol level pending.  PT/INR, aPTT pending.  Chest x-ray showing no acute findings.  Head CT showing findings concerning for an acute or subacute infarct involving the left frontal lobe.  Asymmetric density of left M2 concerning for thrombus.  No acute intracranial hemorrhage.  1 L normal saline bolus given.  Patient was seen by neurology. Deemed not to be a candidate for TPA due to established hypodensity and not a candidate for endovascular thrombectomy due to symptoms ongoing for multiple days.  The patient was roomed in the ED for the first day and stroke work up was in process. She was seen by neurology. Echocardiogram was obtained that demonstrated. It demonstrated left ventricular ejection fraction of 60-65% with Grade 1 DD. No intracardiac source of thrombus was seen. MRI confirmed infarct of left frontal lobe. MRA demonstrated likely thrombus of the left M2.  Dr. Roda Shutters has ordered cortrak. However, this will not be able to be placed until Monday. NGT has  been ordered to be placed by nursing in its stead. SLP has determined that the patient has dense expressive aphasia. BL lower extremity dopplers were obtained this morning. They were negative for DVT.  Initially SLP evaluated and determined that her swallow was not safe and that alternative means for nutrition should be sought.The patient has been re-evaluated by SLP. At this point it appears that she will be able to take PO, and may not need a NGT after all.   Consultants  . Neurology  Procedures  . none  Antibiotics   Anti-infectives (From admission, onward)   None     Subjective  The patient is resting comfortably. She is not verbal.   Objective   Vitals:  Vitals:   11/19/19 0925 11/19/19 1204  BP: (!) 173/83 (!) 144/109  Pulse: 88 84  Resp:    Temp: 98.7 F (37.1 C) 97.7 F (36.5 C)  SpO2: 96% 96%   Exam:  Constitutional:  . The patient is awake this morning. She does not appear to be in any acute distress. Eyes:  . pupils and irises appear normal . Gaze tends to be leftward . Normal lids and conjunctivae Respiratory:  . No increased work of breathing. . No wheezes, rales, or rhonchi . No tactile fremitus Cardiovascular:  . Regular rate and rhythm . No murmurs, ectopy, or gallups. . No lateral PMI. No thrills. Abdomen:  . Abdomen is soft, non-tender, non-distended . No hernias, masses, or organomegaly . Normoactive bowel sounds.  Musculoskeletal:  . No cyanosis, clubbing, or edema Skin:  . No rashes, lesions,  ulcers . palpation of skin: no induration or nodules Neurologic:  . Patient is moving all extremities Psychiatric:  . Mental status . Unable to evaluate as the patient is unable to cooperate with exam.  I have personally reviewed the following:   Today's Data  . Vitals, BMP, CBC  Imaging  . B/L LE dopplers . MRI/MRA brain . CT head  Cardiology Data  . Echocardiogram  Scheduled Meds: .  stroke: mapping our early stages of recovery  book   Does not apply Once  . aspirin  300 mg Rectal Daily   Or  . aspirin  325 mg Oral Daily  . enoxaparin (LOVENOX) injection  40 mg Subcutaneous Daily   Continuous Infusions: . sodium chloride 75 mL/hr at 11/18/19 2341    Principal Problem:   Acute CVA (cerebrovascular accident) [name] Active Problems:   Hypertension   Lactic acidosis   Elevated troponin   Hypokalemia   LOS: 2 days   A & P  Acute to subacute ischemic infarct of the left frontal lobe: Head CT showing findings concerning for an acute or subacute infarct involving the left frontal lobe. No acute intracranial hemorrhage. Patient was seen by neurology and deemed not to be a candidate for TPA due to established hypodensity and not a candidate for endovascular thrombectomy due to symptoms ongoing for multiple days. MRA demonstrated likely thrombus of the left M2 segment of the left MCA. Echocardiogram was obtained that demonstrated. It demonstrated left ventricular ejection fraction of 60-65% with Grade 1 DD. No intracardiac source of thrombus was seen. She has been started on Aspirin 300 mg PR. She has not been started on a statin due to intolerance of statins (myalgias). She has dense expressive aphasia and dysphagia. Initially SLP evaluated and determined that her swallow was not safe and that alternative means for nutrition should be sought.The patient has been re-evaluated by SLP. At this point it appears that she will be able to take PO, and may not need a NGT after all.   She will need anticoagulation should she be found to be in atrial fibrillation.  I greatly appreciate neurology's assistance.  Dysphagia: Initially SLP evaluated and determined that her swallow was not safe and that alternative means for nutrition should be sought.The patient has been re-evaluated by SLP. At this point it appears that she will be able to take PO, and may not need a NGT after all.   Mild lactic acidosis: Resolved. Likely secondary to  dehydration.  No fever or leukocytosis to suggest underlying infection. Chest x-ray not suggestive of pneumonia.  Mildly elevated troponin: Troponin trending down. EKG without ischemic changes. Does have a history of CAD status post PCI, however, EKG not suggestive of ACS. Continue telemetry.  Mild hypokalemia: 3.0 this morning. Supplemented. Monitor. Magnesium level normal.   Hypertension: Systolic in the 944H to 675F. IV Hydralazine is available as needed for SBP >160.  I have seen and examined this patient myself. I have spent 30 minutes in her evaluation and care.  Pt resided in a memory care facility [facility]) prior to admission.  DVT prophylaxis: Lovenox Code Status: DNR. Family Communication: I have  Disposition Plan: Anticipate discharge to nursing home after clinical improvement.  Jamario Colina, DO [facility] Direct contact: see [email or web address]  7PM-7AM contact night coverage as above 11/19/2019, 12:49 PM  LOS: 1 day

## 2019-11-19 NOTE — Progress Notes (Signed)
  Speech Language Pathology Treatment: Dysphagia  Patient Details Name: [name] MRN: [id number] DOB: [birth year] Today's Date: 11/19/2019 Time: 4665-9935 SLP Time Calculation (min) (ACUTE ONLY): 15 min  Assessment / Plan / Recommendation Clinical Impression  Pt was encountered asleep in bed and she roused to max verbal and tactile cues; however, she remained lethargic throughout this session.  RN reported that pt was tolerating current diet of Dysphagia 1 (puree) solids and thin liquid well, but that she required extra time to consume puree.  Pt was seen with trials of thin liquid and puree from lunch meal tray.  Pt presented with worsening oral dysphagia in this session as compared with yesterday's evaluation.  She exhibited good labial closure around the spoon and straw and she was able to independently pull liquid from the straw to her oral cavity.  Pt with timely AP transport of thin liquids, but she exhibited bolus holding and significantly prolonged AP transport of pureed solids.  Presentation of dry spoon following puree trials improved AP transport, but it was still delayed and oral residue was observed following swallow initiation.  Pt was provided water as a liquid wash and it resulted in a delayed cough.  Oral care was provided following po trials and moderate amounts of puree residue mixed with secretions were suctioned from the pt's buccal cavities.  Pt with increased RR following oral care.  At this time recommend continuation of Dysphagia 1 (puree) solids and thin liquid with close monitoring of diet tolerance.  Continue to suspect that pt may not be able to meet her nutritional needs via po intake alone at this time, therefore consideration of alternative means of nutrition may be appropriate.  Spoke with RN regarding recommendations.     HPI HPI: Pt is a 85 yo female presenting with AMS, found to have acute/early subacute infarct involving the anterolateral left frontal lobe, lateral  left basal ganglia and anterior left insula with associated petechial hemorrhage. PMH includes: dementia, CAD, anxiety, depression, vertigo, osteoporosis, HTN, HLD      SLP Plan  Continue with current plan of care       Recommendations  Diet recommendations: Dysphagia 1 (puree);Thin liquid Liquids provided via: Teaspoon;Straw;Cup Medication Administration: Crushed with puree Supervision: Full supervision/cueing for compensatory strategies;Trained caregiver to feed patient Compensations: Minimize environmental distractions;Slow rate;Small sips/bites;Lingual sweep for clearance of pocketing;Follow solids with liquid(Use dry spoon ) Postural Changes and/or Swallow Maneuvers: Seated upright 90 degrees;Upright 30-60 min after meal                Oral Care Recommendations: Oral care QID;Staff/trained caregiver to provide oral care Follow up Recommendations: Skilled Nursing facility;24 hour supervision/assistance SLP Visit Diagnosis: Dysphagia, unspecified (R13.10) Plan: Continue with current plan of care       [place]., M.S., [place] Office: [phone]  [place] 11/19/2019, 1:27 PM

## 2019-11-19 NOTE — Progress Notes (Signed)
Pt alert, aphasic. Occasionally mumbles unintelligibly. Still unable to establish orientation. Pt ate approximately 12 bites each of breakfast, lunch and dinner. Swallowed thin liquids well, although she did better drinking out of a cup without a lid or straw.

## 2019-11-20 LAB — CBC
HCT: 43.4 % (ref 36.0–46.0)
Hemoglobin: 14.2 g/dL (ref 12.0–15.0)
MCH: 29.3 pg (ref 26.0–34.0)
MCHC: 32.7 g/dL (ref 30.0–36.0)
MCV: 89.7 fL (ref 80.0–100.0)
Platelets: 281 10*3/uL (ref 150–400)
RBC: 4.84 MIL/uL (ref 3.87–5.11)
RDW: 14.8 % (ref 11.5–15.5)
WBC: 11.7 10*3/uL — ABNORMAL HIGH (ref 4.0–10.5)
nRBC: 0 % (ref 0.0–0.2)

## 2019-11-20 LAB — BASIC METABOLIC PANEL
Anion gap: 13 (ref 5–15)
BUN: 11 mg/dL (ref 8–23)
CO2: 20 mmol/L — ABNORMAL LOW (ref 22–32)
Calcium: 8.3 mg/dL — ABNORMAL LOW (ref 8.9–10.3)
Chloride: 110 mmol/L (ref 98–111)
Creatinine, Ser: 0.73 mg/dL (ref 0.44–1.00)
GFR calc Af Amer: >60 mL/min (ref >60)
GFR calc non Af Amer: >60 mL/min (ref >60)
Glucose, Bld: 127 mg/dL — ABNORMAL HIGH (ref 70–99)
Potassium: 3.1 mmol/L — ABNORMAL LOW (ref 3.5–5.1)
Sodium: 143 mmol/L (ref 135–145)

## 2019-11-20 NOTE — Progress Notes (Signed)
PROGRESS NOTE  [name] FAO:[id number] DOB: January 21, 1927 DOA: 11/27/2019 PCP: Cassandria Anger, MD  Brief History   [name] is a 85 y.o. female with medical history significant of dementia, CAD status post PCI, ischemic cardiomyopathy, hypertension, hyperlipidemia, osteoporosis depression, anxiety presenting to the ED from her nursing home for evaluation of altered mental status.  Staff reported that patient has not been acting like herself for the past 1.5 days.  She is normally ambulatory but will not walk or talk at this time.  She was recently treated for UTI and just finished taking Keflex.  Patient is nonverbal and no history could be obtained from her.  ED Course: Afebrile and no leukocytosis.  Lactic acid 2.2.  Blood glucose 153.  Creatinine 1.2, baseline 0.9-1.2.  Rapid SARS-CoV-2 antigen test negative, PCR test pending.  UA and urine culture pending.  Blood culture x2 pending.  High-sensitivity troponin 22. UDS and blood ethanol level pending.  PT/INR, aPTT pending.  Chest x-ray showing no acute findings.  Head CT showing findings concerning for an acute or subacute infarct involving the left frontal lobe.  Asymmetric density of left M2 concerning for thrombus.  No acute intracranial hemorrhage.  1 L normal saline bolus given.  Patient was seen by neurology. Deemed not to be a candidate for TPA due to established hypodensity and not a candidate for endovascular thrombectomy due to symptoms ongoing for multiple days.  The patient was roomed in the ED for the first day and stroke work up was in process. She was seen by neurology. Echocardiogram was obtained that demonstrated. It demonstrated left ventricular ejection fraction of 60-65% with Grade 1 DD. No intracardiac source of thrombus was seen. MRI confirmed infarct of left frontal lobe. MRA demonstrated likely thrombus of the left M2.  Dr. Erlinda Hong has ordered cortrak. However, this will not be able to be placed until Monday. NGT has  been ordered to be placed by nursing in its stead. SLP has determined that the patient has dense expressive aphasia. BL lower extremity dopplers were obtained this morning. They were negative for DVT.  Initially SLP evaluated and determined that her swallow was not safe and that alternative means for nutrition should be sought.The patient has been re-evaluated by SLP. Although on 11/19/2019 it appeared that the patient would be able to tolerate a DYS1 diet, today the patient appears to have regressed. I have discussed the patient with her daughter. We will hold off on cortrak tube placement at this time. I have also consulted palliative care to address goals of care with the daughter. Consultants  . Neurology  Procedures  . none  Antibiotics   Anti-infectives (From admission, onward)   None     Subjective  The patient is lying on her side and not interactive today. No new issues per nursing.  Objective   Vitals:  Vitals:   11/20/19 0749 11/20/19 1237  BP: (!) 154/60 (!) 143/63  Pulse: 79 78  Resp: 20 18  Temp: 98.7 F (37.1 C) 98.5 F (36.9 C)  SpO2: 96% 96%   Exam:  Constitutional:  . The patient is awake this morning. She does not appear to be in any acute distress. She does not attend to me, although her eyes are open. She is not interactive. Eyes:  . pupils and irises appear normal . Gaze tends to be leftward . Normal lids and conjunctivae Respiratory:  . No increased work of breathing. . No wheezes, rales, or rhonchi . No  tactile fremitus Cardiovascular:  . Regular rate and rhythm . No murmurs, ectopy, or gallups. . No lateral PMI. No thrills. Abdomen:  . Abdomen is soft, non-tender, non-distended . No hernias, masses, or organomegaly . Normoactive bowel sounds.  Musculoskeletal:  . No cyanosis, clubbing, or edema Skin:  . No rashes, lesions, ulcers . palpation of skin: no induration or nodules Neurologic:  Unable to evaluate as the patient is unable to  cooperate with exam. Psychiatric:  . Mental status . Unable to evaluate as the patient is unable to cooperate with exam.  I have personally reviewed the following:   Today's Data  . Vitals, BMP, CBC  Imaging  . B/L LE dopplers . MRI/MRA brain . CT head  Cardiology Data  . Echocardiogram  Scheduled Meds: .  stroke: mapping our early stages of recovery book   Does not apply Once  . aspirin EC  81 mg Oral Daily  . clopidogrel  75 mg Oral Daily  . enoxaparin (LOVENOX) injection  40 mg Subcutaneous Daily   Continuous Infusions: . sodium chloride 75 mL/hr at 11/20/19 1023    Principal Problem:   Acute CVA (cerebrovascular accident) [facility]) Active Problems:   Hypertension   Lactic acidosis   Elevated troponin   Hypokalemia   LOS: 3 days   A & P  Acute to subacute ischemic infarct of the left frontal lobe: Head CT showing findings concerning for an acute or subacute infarct involving the left frontal lobe. No acute intracranial hemorrhage. Patient was seen by neurology and deemed not to be a candidate for TPA due to established hypodensity and not a candidate for endovascular thrombectomy due to symptoms ongoing for multiple days. MRA demonstrated likely thrombus of the left M2 segment of the left MCA. Echocardiogram was obtained that demonstrated. It demonstrated left ventricular ejection fraction of 60-65% with Grade 1 DD. No intracardiac source of thrombus was seen. She has been started on Aspirin 300 mg PR. She has not been started on a statin due to intolerance of statins (myalgias). She has dense expressive aphasia and dysphagia. Initially SLP evaluated and determined that her swallow was not safe and that alternative means for nutrition should be sought.The patient has been re-evaluated by SLP. Initially SLP evaluated and determined that her swallow was not safe and that alternative means for nutrition should be sought.The patient has been re-evaluated by SLP. Although on  11/19/2019 it appeared that the patient would be able to tolerate a DYS1 diet, today the patient appears to have regressed. I have discussed the patient with her daughter. We will hold off on cortrak tube placement at this time. I have also consulted palliative care to address goals of care with the daughter. She will need anticoagulation should she be found to be in atrial fibrillation.  I greatly appreciate neurology's assistance.  Dysphagia: Initially SLP evaluated and determined that her swallow was not safe and that alternative means for nutrition should be sought.The patient has been re-evaluated by SLP. Although on 11/19/2019 it appeared that the patient would be able to tolerate a DYS1 diet, today the patient appears to have regressed. I have discussed the patient with her daughter. We will hold off on cortrak tube placement at this time. I have also consulted palliative care to address goals of care with the daughter.  Mild lactic acidosis: Resolved. Likely secondary to dehydration.  No fever or leukocytosis to suggest underlying infection. Chest x-ray not suggestive of pneumonia.  Mildly elevated troponin: Troponin trending down.  EKG without ischemic changes. Does have a history of CAD status post PCI, however, EKG not suggestive of ACS. Continue telemetry.  Mild hypokalemia: 3.1 this morning. Supplemented. Monitor. Magnesium level normal.   Hypertension: Systolic in the 150s to 170s. IV Hydralazine is available as needed for SBP >160.  I have seen and examined this patient myself. I have spent 40 minutes in her evaluation and care. More than 50% of this time was spent in counseling with the patient's daughter.   Pt resided in a memory care facility [facility]) prior to admission. Unknown if she will be able to return there.  DVT prophylaxis: Lovenox Code Status: DNR. Family Communication: I have spoken with the patient's daughter, [name].  Disposition Plan: Anticipate  discharge to nursing home after clinical improvement.  Yuvaan Olander, DO [facility] Direct contact: see [email or web address]  7PM-7AM contact night coverage as above 11/20/2019, 1:12 PM  LOS: 1 day

## 2019-11-20 NOTE — Progress Notes (Signed)
Nutrition Follow-up  DOCUMENTATION CODES:   Not applicable  INTERVENTION:  Pending goals of care,  If tube feeding warranted, recommend Osmolite 1.2 formula @ 25 ml/hr and increase by 10 ml every 4 hours to goal rate of 55 ml/hr.   Tube feeding regimen provides 1584 kcal (100% of needs), 73 grams of protein, and 1082 ml of H2O.   NUTRITION DIAGNOSIS:   Inadequate oral intake related to inability to eat as evidenced by NPO status; ongoing  GOAL:   Patient will meet greater than or equal to 90% of their needs; not met  MONITOR:   Labs, I & O's, TF tolerance, Diet advancement  REASON FOR ASSESSMENT:   Consult Enteral/tube feeding initiation and management  ASSESSMENT:   85 year old female with medical history significant of dementia, CAD s/p PCI, ischemic cardiomyopathy, HTN, HLD, osteoporosis, depression and anxiety presented to ED from her nursing home for evaluation of altered mental status. Staff reports that patient has not been acting like herself for the past 1.5 days, normally she is ambulatory and has not been able to walk and nonverbal. Head CT showed findings concerning for acute infarct of left frontal lobe; asymmetric density of left M2 concerning for thrombus. Patient seen by neurology and deemed not a candidate for TPA d/t established hypodensity and not a candidate for endovascular thrombectomy d/t ongoing symptoms for multiple days. MRI - moderate sized left MCA infarct;M2 high grade stenosis  Per notes, pt with global aphasia, left gaze preference, not following commands, but moving all extremities. Diet able to be advanced 12/20 to a dysphagia 1 diet. Family at bedside report pt eating well PTA with no difficulties.   After SLP re-evaluation today, pt made NPO due to increased lethargy. Per MD, plan to place Cortrak NGT for enteral nutrition on hold at this time. Palliative to be consulted to discuss goals of care with family. RD to discontinue calorie count ordered  by MD earlier today.   Unable to complete Nutrition-Focused physical exam at this time. Family at bedside requested pt to rest.   Labs and medications reviewed.   Diet Order:   Diet Order            Diet NPO time specified  Diet effective now              EDUCATION NEEDS:   No education needs have been identified at this time  Skin:  Skin Assessment: Reviewed RN Assessment  Last BM:  12/20  Height:   Ht Readings from Last 1 Encounters:  08/03/19 5' 2"  (1.575 m)    Weight:   Wt Readings from Last 1 Encounters:  11/17/19 66.6 kg    Ideal Body Weight:  50 kg  BMI:  Body mass index is 26.85 kg/m.  Estimated Nutritional Needs:   Kcal:  1400-1600  Protein:  60-75 grams  Fluid:  >/= 1.5 L/day    Corrin Parker, MS, RD, LDN Pager # [phone] After hours/ weekend pager # [phone]

## 2019-11-20 NOTE — Progress Notes (Signed)
STROKE TEAM PROGRESS NOTE   INTERVAL HISTORY Pt lying in bed, globally aphasic, left gaze preference, not following commands but moving all extremities symmetrically.   Did pass swallow,  .Vitals stable. No changes White count still trending up.  Potassium remains low getting supplementation  Vitals:   11/19/19 2318 11/20/19 0411 11/20/19 0749 11/20/19 1237  BP: (!) 163/68 136/60 (!) 154/60 (!) 143/63  Pulse: 72 83 79 78  Resp: 18 18 20 18   Temp: 99.5 F (37.5 C) 98.3 F (36.8 C) 98.7 F (37.1 C) 98.5 F (36.9 C)  TempSrc: Oral Oral Oral Oral  SpO2: 94% 94% 96% 96%  Weight:        CBC:  Recent Labs  Lab 11/18/19 0247 11/19/19 0547 11/20/19 0431  WBC 9.0 10.7* 11.7*  NEUTROABS 5.5 6.7  --   HGB 14.6 14.5 14.2  HCT 45.7 45.4 43.4  MCV 91.6 91.5 89.7  PLT 259 293 154    Basic Metabolic Panel:  Recent Labs  Lab 11/17/19 0210 11/19/19 0547 11/20/19 0431  NA  --  146* 143  K  --  3.0* 3.1*  CL  --  113* 110  CO2  --  20* 20*  GLUCOSE  --  133* 127*  BUN  --  13 11  CREATININE  --  0.76 0.73  CALCIUM  --  8.4* 8.3*  MG 2.6*  --   --    Lipid Panel:     Component Value Date/Time   CHOL 139 11/17/2019 0341   TRIG 272 (H) 11/17/2019 0341   HDL 26 (L) 11/17/2019 0341   CHOLHDL 5.3 11/17/2019 0341   VLDL 54 (H) 11/17/2019 0341   LDLCALC 59 11/17/2019 0341   HgbA1c:  Lab Results  Component Value Date   HGBA1C 6.9 (H) 11/17/2019   Urine Drug Screen:     Component Value Date/Time   LABOPIA NONE DETECTED 11/17/2019 0213   COCAINSCRNUR NONE DETECTED 11/17/2019 0213   LABBENZ NONE DETECTED 11/17/2019 0213   AMPHETMU NONE DETECTED 11/17/2019 0213   THCU NONE DETECTED 11/17/2019 0213   LABBARB NONE DETECTED 11/17/2019 0213    Alcohol Level     Component Value Date/Time   ETH <10 11/17/2019 0210    IMAGING  MR ANGIO HEAD WO CONTRAST 11/17/2019 IMPRESSION:   MRI brain:  1. Acute/early subacute infarct involving the anterolateral left frontal lobe,  lateral left basal ganglia and anterior left insula with associated petechial hemorrhage, as described.  2. Mild associated mass effect with minimal effacement of the left lateral ventricle frontal horn.  3. Generalized parenchymal atrophy with moderate chronic small vessel ischemic disease. Chronic right cerebellar lacunar infarct.   MRA head:  1. High-grade focal stenosis within a superior division proximal M2 left MCA branch shortly beyond its origin. There is also segmental near occlusive or occlusive stenosis within a proximal left M2 branch more distally.  2. Moderate focal stenosis at the origin of the right anterior right anterior cerebral artery.  3. Additional intracranial atherosclerotic disease as described with no other significant proximal arterial stenosis identified.  MR Angiogram Neck W or Wo Contrast 11/17/2019 IMPRESSION:  The common carotid, internal carotid and vertebral arteries are patent within the neck without significant stenosis  ECHOCARDIOGRAM COMPLETE 11/17/2019 IMPRESSIONS   1. Left ventricular ejection fraction, by visual estimation, is 60 to 65%. The left ventricle has normal function. Left ventricular septal wall thickness was mildly increased. There is no left ventricular hypertrophy.   2. Left ventricular diastolic  parameters are consistent with Grade I diastolic dysfunction (impaired relaxation).   3. Global right ventricle has normal systolic function.The right ventricular size is normal. No increase in right ventricular wall thickness.   4. Left atrial size was normal.   5. Right atrial size was normal.   6. The mitral valve is normal in structure. Mild mitral valve regurgitation. No evidence of mitral stenosis.   7. The tricuspid valve is normal in structure. Tricuspid valve regurgitation is mild.   8. The aortic valve is normal in structure. Aortic valve regurgitation is trivial. No evidence of aortic valve sclerosis or stenosis.   9. The pulmonic  valve was normal in structure. Pulmonic valve regurgitation is trivial.  10. Mildly elevated pulmonary artery systolic pressure. 1 1. The inferior vena cava is normal in size with greater than 50% respiratory variability, suggesting right atrial pressure of 3 mmHg.     VAS [place] LOWER EXTREMITY VENOUS (DVT) 11/18/2019 Summary:  Right: There is no evidence of deep vein thrombosis in the lower extremity. However, portions of this examination were limited- see technologist comments above. No cystic structure found in the popliteal fossa.  Left: There is no evidence of deep vein thrombosis in the lower extremity. No cystic structure found in the popliteal fossa.    PHYSICAL EXAM  Temp:  [98 F (36.7 C)-99.5 F (37.5 C)] 98.5 F (36.9 C) (12/21 1237) Pulse Rate:  [72-87] 78 (12/21 1237) Resp:  [16-20] 18 (12/21 1237) BP: (136-163)/(60-86) 143/63 (12/21 1237) SpO2:  [94 %-96 %] 96 % (12/21 1237)  General - Well nourished, well developed, in no apparent distress, lethargic.  Ophthalmologic - fundi not visualized due to noncooperation.  Cardiovascular - Regular rhythm and rate.  Neuro - lethargic and eyes closed, however easily open with stimulation.  Global aphasia, no speech output, not following commands.  Left gaze preference, barely cross midline to the right.  Blinking to visual threat on the left and right lower quadrant, not right upper quadrant.  Not tracking on the right.  PERRL.  Right facial droop.  Tongue protrusion not corporative.  Bilateral upper extremity 3/5, symmetrical, bilateral lower extremity 3/5 with pain stimulation.  DTR 1+, no Babinski. Sensation, coordination and gait not tested.    ASSESSMENT/PLAN Ms. KEYONIA GLUTH is a 85 y.o. female with history of  dementia, coronary artery disease, hypertension hyperlipidemia presenting from [facility] with multiple days of lethargy, confusion and not acting herself.  Stroke:  left MCA moderate infarct in setting of L M2  stenosis, could be large vessel disease but cannot rule out cardioembolic source CT head acute/subacute L frontal lobe infarct. Dense L M2. Small vessel disease. Moderate Atrophy.   MRI  Anterolateral L frontal lobe, L basal ganglia and anterior L insular infarcts w/ hemorrhagic transformation. Mild mass effect and effacements L lateral ventricle. Small vessel disease. Atrophy. Old R cerebellar infarct    MRA head  High-grade superior proximal L M2 branch stenosis, near-occlusive L M2 distal stenosis. Moderate R ACA stenosis. Intracranial atherosclerosis.  MRA neck Unremarkable   2D Echo EF 60-65%. No source of embolus   LE venous doppler - negative for DVT  May consider 30 day cardiac event monitoring as outpt to rule out afib if above work up negative  LDL 59  HgbA1c 6.9  Lovenox 40 mg sq daily for VTE prophylaxis  clopidogrel 75 mg daily prior to admission, now on aspirin 300 mg suppository daily.  Start DAPT now that she is able to  swallow  Therapy recommendations:  SNF  Disposition:  pending   Dysphagia  Did not pass swallow  NPO  Put on IVF @ 77  had cortrak -now able to swallow consider po meds once po access  Consider TF once cortrak placed  Hypertension  Stable . Permissive hypertension (OK if < 220/120) but gradually normalize in 3-5 days . BP within range now . Long-term BP goal normotensive  Hyperlipidemia  Home meds:  lipitor 20  Resume statin in hospital once po access  LDL 59, goal < 70  Continue statin at discharge  Likely Pre-Diabetes  HgbA1c 6.9, goal < 7.0  SSI  CBG monitoring  Other Stroke Risk Factors  Advanced age  Coronary artery disease s/p PTCA  Hx ischemic cardiomyopathy - EF 30% in 2002 currently EF 60--65%  Other Active Problems  Baseline dementia  Slightly elevated troponin 22->22 insignificant  Hypokalemia 3.3 - supplement - 3.0 - supplement further and recheck in AM  Mild lactic acidosis   WBC - 10.7 ;  Temp - 99.1 (U/A and CXR 12/18 -> OK)  DNR  Hospital day # 3 Watch p.o. intake to ensure that it is adequate.  Replace potassium.  Expect transfer to skilled nursing facility if condition improves over the next few days Stroke team will sign off. Call for questions  Dr.Swayze  Delia HeadyPramod Elim Peale, MD  To contact Stroke Continuity provider, please refer to [email or web address]. After hours, contact General Neurology

## 2019-11-20 NOTE — Care Management Important Message (Signed)
Important Message  Patient Details  Name: [name] MRN: [id number] Date of Birth: [birth year]   Medicare Important Message Given:     Due to illness patient was not able to sign.  I signed on the patient behalf and left a copy at the patient bedside.  Dorice Stiggers 11/20/2019, 12:31 PM

## 2019-11-20 NOTE — Progress Notes (Signed)
  Speech Language Pathology Treatment: Dysphagia  Patient Details Name: [name] MRN: [id number] DOB: Dec 05, 1926 Today's Date: 11/20/2019 Time: 9485-4627 SLP Time Calculation (min) (ACUTE ONLY): 35 min  Assessment / Plan / Recommendation Clinical Impression  Pt was encountered asleep in bed and she roused to moderate verbal and tactile cues, but she remained lethargic throughout this session.  She exhibited unintelligible jargon x1, otherwise there were no vocalization attempts.  She followed 1-step commands to open and close her mouth given tactile cues, but she exhibited difficulty following other basic 1 stp commands.  Pt was seen with trials of puree from her lunch meal tray.  She exhibited minimal lingual manipulation and no attempt at swallow initiation despite max cues.  Bolus was suctioned from the pt's oral cavity secondary to aspiration concerns.  She was additionally seen with trials of thin liquid, nectar-thick liquid, and honey-thick liquid.  Pt with reduced labial closure around the straw, and she was unable to draw liquid from the straw to her oral cavity.  She was more successful with assisted cup sips and swallow initiation was observed with liquid trials; however, she exhibited an immediate throat clear/cough and increased respiratory rate following all liquid trials.  No further po trials were attempted on this date secondary to aspiration concerns and oral care was provided following po trials.   Recommend NPO with short-term alternative means of nutrition and frequent oral care.  Pt may have a few small ice chips or spoon sips of water following thorough oral care if she is awake/alert to mobilize swallow musculature and to help keep oral mucosa moistened.  Pt is not appropriate for a MBS on this date secondary to lethargy, but it may be beneficial in the future if/when the pt is more alert.   Daughter was educated regarding all recommendations and she verbalized understanding.   SLP will continue to f/u per POC.     HPI HPI: Pt is a 85 yo female presenting with AMS, found to have acute/early subacute infarct involving the anterolateral left frontal lobe, lateral left basal ganglia and anterior left insula with associated petechial hemorrhage. PMH includes: dementia, CAD, anxiety, depression, vertigo, osteoporosis, HTN, HLD      SLP Plan  Goals updated       Recommendations  Diet recommendations: NPO Medication Administration: Via alternative means                Oral Care Recommendations: Oral care QID;Staff/trained caregiver to provide oral care Follow up Recommendations: Skilled Nursing facility;24 hour supervision/assistance SLP Visit Diagnosis: Dysphagia, unspecified (R13.10) Plan: Goals updated       GO               [name]., M.S., [place] Office: [phone]  [place] 11/20/2019, 1:24 PM

## 2019-11-21 DIAGNOSIS — R451 Restlessness and agitation: Secondary | ICD-10-CM

## 2019-11-21 DIAGNOSIS — Z66 Do not resuscitate: Secondary | ICD-10-CM

## 2019-11-21 DIAGNOSIS — Z515 Encounter for palliative care: Secondary | ICD-10-CM

## 2019-11-21 LAB — CULTURE, BLOOD (ROUTINE X 2)
Culture: NO GROWTH
Special Requests: ADEQUATE

## 2019-11-21 LAB — BASIC METABOLIC PANEL
Anion gap: 10 (ref 5–15)
BUN: 13 mg/dL (ref 8–23)
CO2: 21 mmol/L — ABNORMAL LOW (ref 22–32)
Calcium: 8.2 mg/dL — ABNORMAL LOW (ref 8.9–10.3)
Chloride: 111 mmol/L (ref 98–111)
Creatinine, Ser: 0.73 mg/dL (ref 0.44–1.00)
GFR calc Af Amer: >60 mL/min (ref >60)
GFR calc non Af Amer: >60 mL/min (ref >60)
Glucose, Bld: 119 mg/dL — ABNORMAL HIGH (ref 70–99)
Potassium: 3.1 mmol/L — ABNORMAL LOW (ref 3.5–5.1)
Sodium: 142 mmol/L (ref 135–145)

## 2019-11-21 MED ORDER — MORPHINE SULFATE (CONCENTRATE) 10 MG/0.5ML PO SOLN
5.0000 mg | ORAL | Status: DC | PRN
  Administered 2019-11-21 – 2019-11-22 (×3): 5 mg via SUBLINGUAL
  Filled 2019-11-21 (×3): qty 0.5

## 2019-11-21 MED ORDER — LORAZEPAM 2 MG/ML IJ SOLN
1.0000 mg | INTRAMUSCULAR | Status: DC | PRN
  Administered 2019-11-21 – 2019-11-22 (×4): 1 mg via INTRAVENOUS
  Filled 2019-11-21 (×4): qty 1

## 2019-11-21 MED ORDER — POTASSIUM CHLORIDE 10 MEQ/100ML IV SOLN
10.0000 meq | INTRAVENOUS | Status: DC
  Administered 2019-11-21: 10 meq via INTRAVENOUS
  Filled 2019-11-21: qty 100

## 2019-11-21 NOTE — TOC Progression Note (Signed)
Transition of Care [facility]) - Progression Note    Patient Details  Name: [name] MRN: [id number] Date of Birth: 12/08/1926  Transition of Care [facility] CM/SW [place], [place] Phone Number: 11/21/2019, 11:03 AM  Clinical Narrative:   CSW met with patient's daughter, [name], to discuss SNF placement. [name] indicated that she had not received the CMS list yesterday. CSW provided bed offers to [place] as well as new CMS list for her to review. [name] appreciative of information and said she would research options.  [name] later updated CSW with preference for [facility]. CSW reached out [facility] Admissions to ask them to review patient's referral. [facility] offered a bed for patient, and CSW updated [name].   CSW also obtained signed 30 day note from MD and sent to [facility] for PASRR review. PASRR has been obtained.  CSW to follow for SNF placement when medically stable.    Expected Discharge Plan: [place] Barriers to Discharge: Continued Medical Work up, Ship broker  Expected Discharge Plan and Services Expected Discharge Plan: [place] In-house Referral: Clinical Social Work Discharge Planning Services: NA Post Acute Care Choice: [place] Living arrangements for the past 2 months: [place]                 DME Arranged: N/A         HH Arranged: NA [place] Agency: NA         Social Determinants of Health (SDOH) Interventions    Readmission Risk Interventions No flowsheet data found.

## 2019-11-21 NOTE — Progress Notes (Signed)
PROGRESS NOTE  [name] IOX:[id number] DOB: 17-Jun-1927 DOA: 11/14/2019 PCP: Cassandria Anger, MD  Brief History   [name] is a 85 y.o. female with medical history significant of dementia, CAD status post PCI, ischemic cardiomyopathy, hypertension, hyperlipidemia, osteoporosis depression, anxiety presenting to the ED from her nursing home for evaluation of altered mental status.  Staff reported that patient has not been acting like herself for the past 1.5 days.  She is normally ambulatory but will not walk or talk at this time.  She was recently treated for UTI and just finished taking Keflex.  Patient is nonverbal and no history could be obtained from her.  ED Course: Afebrile and no leukocytosis.  Lactic acid 2.2.  Blood glucose 153.  Creatinine 1.2, baseline 0.9-1.2.  Rapid SARS-CoV-2 antigen test negative, PCR test pending.  UA and urine culture pending.  Blood culture x2 pending.  High-sensitivity troponin 22. UDS and blood ethanol level pending.  PT/INR, aPTT pending.  Chest x-ray showing no acute findings.  Head CT showing findings concerning for an acute or subacute infarct involving the left frontal lobe.  Asymmetric density of left M2 concerning for thrombus.  No acute intracranial hemorrhage.  1 L normal saline bolus given.  Patient was seen by neurology. Deemed not to be a candidate for TPA due to established hypodensity and not a candidate for endovascular thrombectomy due to symptoms ongoing for multiple days.  The patient was roomed in the ED for the first day and stroke work up was in process. She was seen by neurology. Echocardiogram was obtained that demonstrated. It demonstrated left ventricular ejection fraction of 60-65% with Grade 1 DD. No intracardiac source of thrombus was seen. MRI confirmed infarct of left frontal lobe. MRA demonstrated likely thrombus of the left M2.  Dr. Erlinda Hong has ordered cortrak. However, this will not be able to be placed until Monday. NGT has  been ordered to be placed by nursing in its stead. SLP has determined that the patient has dense expressive aphasia. BL lower extremity dopplers were obtained this morning. They were negative for DVT.  Initially SLP evaluated and determined that her swallow was not safe and that alternative means for nutrition should be sought.The patient has been re-evaluated by SLP. Although on 11/19/2019 it appeared that the patient would be able to tolerate a DYS1 diet, today the patient appears to have regressed. I have discussed the patient with her daughter. We will hold off on cortrak tube placement at this time. I have also consulted palliative care to address goals of care with the daughter. Consultants  . Neurology  Procedures  . none  Antibiotics   Anti-infectives (From admission, onward)   None     Subjective  The patient is unchanged from yesterday. Opens eyes, but does not interact.  Objective   Vitals:  Vitals:   11/21/19 0410 11/21/19 0858  BP:  (!) 169/82  Pulse:  88  Resp: 20 18  Temp:  99.5 F (37.5 C)  SpO2:  95%   Exam:  Constitutional:  . The patient is awake this morning. She does not appear to be in any acute distress. She does not attend to me, although her eyes are open. She is not interactive. Eyes:  . pupils and irises appear normal . Gaze tends to be leftward . Normal lids and conjunctivae Respiratory:  . No increased work of breathing. . No wheezes, rales, or rhonchi . No tactile fremitus Cardiovascular:  . Regular rate and  rhythm . No murmurs, ectopy, or gallups. . No lateral PMI. No thrills. Abdomen:  . Abdomen is soft, non-tender, non-distended . No hernias, masses, or organomegaly . Normoactive bowel sounds.  Musculoskeletal:  . No cyanosis, clubbing, or edema Skin:  . No rashes, lesions, ulcers . palpation of skin: no induration or nodules Neurologic:  Unable to evaluate as the patient is unable to cooperate with exam. Psychiatric:   . Mental status . Unable to evaluate as the patient is unable to cooperate with exam.  I have personally reviewed the following:   Today's Data  . Vitals, BMP, CBC  Imaging  . B/L LE dopplers . MRI/MRA brain . CT head  Cardiology Data  . Echocardiogram  Scheduled Meds: .  stroke: mapping our early stages of recovery book   Does not apply Once  . aspirin EC  81 mg Oral Daily  . clopidogrel  75 mg Oral Daily  . enoxaparin (LOVENOX) injection  40 mg Subcutaneous Daily   Continuous Infusions: . sodium chloride 75 mL/hr at 11/20/19 2347    Principal Problem:   Acute CVA (cerebrovascular accident) [facility](HCC) Active Problems:   Hypertension   Lactic acidosis   Elevated troponin   Hypokalemia   LOS: 4 days   A & P  Acute to subacute ischemic infarct of the left frontal lobe: Head CT showing findings concerning for an acute or subacute infarct involving the left frontal lobe. No acute intracranial hemorrhage. Patient was seen by neurology and deemed not to be a candidate for TPA due to established hypodensity and not a candidate for endovascular thrombectomy due to symptoms ongoing for multiple days. MRA demonstrated likely thrombus of the left M2 segment of the left MCA. Echocardiogram was obtained that demonstrated. It demonstrated left ventricular ejection fraction of 60-65% with Grade 1 DD. No intracardiac source of thrombus was seen. She has been started on Aspirin 300 mg PR. She has not been started on a statin due to intolerance of statins (myalgias). She has dense expressive aphasia and dysphagia. Initially SLP evaluated and determined that her swallow was not safe and that alternative means for nutrition should be sought.The patient has been re-evaluated by SLP. Initially SLP evaluated and determined that her swallow was not safe and that alternative means for nutrition should be sought.The patient has been re-evaluated by SLP. Although on 11/19/2019 it appeared that the patient  would be able to tolerate a DYS1 diet, today the patient appears to have regressed. I have discussed the patient with her daughter. We will hold off on cortrak tube placement at this time. I have also consulted palliative care to address goals of care with the daughter. She will need anticoagulation should she be found to be in atrial fibrillation.  I greatly appreciate neurology's assistance.  Dysphagia: Initially SLP evaluated and determined that her swallow was not safe and that alternative means for nutrition should be sought.The patient has been re-evaluated by SLP. Although on 11/19/2019 it appeared that the patient would be able to tolerate a DYS1 diet, today the patient appears to have regressed. I have discussed the patient with her daughter. We will hold off on cortrak tube placement at this time. I have also consulted palliative care to address goals of care with the daughter.  Mild lactic acidosis: Resolved. Likely secondary to dehydration.  No fever or leukocytosis to suggest underlying infection. Chest x-ray not suggestive of pneumonia.  Mildly elevated troponin: Troponin trending down. EKG without ischemic changes. Does have a history  of CAD status post PCI, however, EKG not suggestive of ACS. Continue telemetry.  Mild hypokalemia: 3.1 this morning. Supplemented. Monitor. Magnesium level normal.   Hypertension: Systolic in the 150s to 170s. IV Hydralazine is available as needed for SBP >160.  I have seen and examined this patient myself. I have spent 32 minutes in her evaluation and care.   Pt resided in a memory care facility [facility]) prior to admission. Unknown if she will be able to return there.  DVT prophylaxis: Lovenox Code Status: DNR. Family Communication: None available. Disposition Plan: Anticipate discharge to nursing home after clinical improvement. Palliative care has been consulted.  Jenafer Winterton, DO [facility] Direct contact: see [email or web address]   7PM-7AM contact night coverage as above 11/21/2019, 12:10 PM  LOS: 1 day

## 2019-11-21 NOTE — Progress Notes (Signed)
Chaplain visited with the patient who was in and out of sleep, and their daughter.  The chaplain spoke with the daughter about comfort care and how she is feeling about what comes next.  The chaplain is available if needed.  Brion Aliment Chaplain Resident For questions concerning this note please contact me by pager [phone]

## 2019-11-21 NOTE — Consult Note (Signed)
Consultation Note Date: 11/21/2019   Patient Name: [name]  DOB: 04/08/1927  MRN: [id number]  Age / Sex: 85 y.o., female  PCP: Plotnikov, Evie Lacks, MD Referring Physician: Karie Kirks, DO  Reason for Consultation: Establishing goals of care and Psychosocial/spiritual support  HPI/Patient Profile: 85 y.o. female  admitted on 12/14/19 with past medical history of dementia, coronary artery disease, hypertension hyperlipidemia brought in from a facility for multiple days of lethargy, and confusion and not acting like herself.  She was living at [facility]  Seen here in the ER 7 days ago for a UTI, placed on Keflex and discharged.  According to the staff per EMS, she has not been acting like this over the last few days.  She has not been ambulatory or talking-which is different from her baseline.  No reported preceding illnesses sicknesses fevers chills shortness of breath nausea vomiting or abdominal complaints.  Noncontrast head CT reveals a subacute stroke in the left frontal area.  Neurological consulted Head CT scan that shows a subacute left frontal stroke.  Not a candidate for TPA due to established hypodensity Not a candidate for endovascular thrombectomy due to symptoms ongoing for multiple days.  Impression: 1. Findings concerning for an acute or subacute infarct involving the left frontal lobe as detailed above. 2. Asymmetric a dense left M2 concerning for thrombus. 3. No acute intracranial hemorrhage. 4. Moderate atrophy and chronic microvascular ischemic changes.  Currently patient  is unable to follow commands, aphasic and appears agitated    Family face treatment options decisions, advanced directive decisions and anticipatory care needs.  Clinical Assessment and Goals of Care:   This NP Wadie Lessen reviewed medical records, received report from team, assessed  the patient and then meet at the patient's bedside along daughter/HPOA/ Lenox Ahr  to discuss diagnosis, prognosis, GOC, EOL wishes disposition and options.  Concept of Hospice and Palliative Care were discussed.  Daughter is aware of hospice benefit having cared for her father at home at end of life.  A detailed discussion was had today regarding advanced directives.  Concepts specific to code status, artifical feeding and hydration, continued IV antibiotics and rehospitalization was had.  The difference between a aggressive medical intervention path  and a palliative comfort care path for this patient at this time was had.  Values and goals of care important to patient and family were attempted to be elicited.  Created space and opportunity for daughter to explore her thoughts and feelings regarding her mother's current medical situation.  She brings a copy of her mother's advanced directive in today, the document clearly states a desire for a natural death.  This and ' knowing' what her mother would or would not want in this situation is helping her make decision for a shift to full comfort  MOST form introduced and Hard Choices booklet left fopr review  Natural trajectory and expectations at EOL were discussed.  Questions and concerns addressed.   Family encouraged to call with questions or concerns.  PMT will continue to support holistically.      HCPOA/[name]     SUMMARY OF RECOMMENDATIONS    Comfort, quality and dignity are focus of care.  No life prolonging measures, allow for a natural death  Code Status/Advance Care Planning:  DNR /DNI - comfort feeds with known risk of aspiration   Symptom Management:   Pain/Dyspnea:  Roxanol 5 mg po/sl every 1 hr prn  Agitation: Ativan 1 mg IV every 4 hrs prn   Palliative Prophylaxis:   Aspiration, Bowel Regimen, Delirium Protocol, Eye Care, Frequent Pain Assessment and Oral Care  Additional Recommendations  (Limitations, Scope, Preferences):  Full Comfort Care  Psycho-social/Spiritual:   Desire for further Chaplaincy support:yes  Additional Recommendations:  Created space and opportunity for family to explore thoughts and feeling regarding current medical situation.  Emotional support offred    Prognosis:   < 2 weeks  Discharge Planning: Evaluate in morning for transition of care  To Be Determined      Primary Diagnoses: Present on Admission: . Acute CVA (cerebrovascular accident) (HCC)   I have reviewed the medical record, interviewed the patient and family, and examined the patient. The following aspects are pertinent.  Past Medical History:  Diagnosis Date  . ANXIETY   . CORONARY ARTERY DISEASE    a. 11/2000 Cath/PTCA: LM 20d, LAD 99p (PTCA), LCX 70p, RCA dom, 750m, EF 30 dist ant AK;  b. 2008 Myoview: EF 75%, small ant apical scar, mild peri-infarct ischemia.  [name]. DEMENTIA   . Dementia (HCC)   . DEPRESSION   . DIVERTICULOSIS, COLON   . HERPES ZOSTER   . History of Ischemic Cardiomyopathy    a. 11/2000 LV gram: EF 30%, dist ant AK;  b. 2008 MV EF 75%.  [name]. Hx of echocardiogram    Echo (8/15):  EF 55%, mild apical HK, Gr 1 DD, mild AI, mild MR, severe LAE, mod RAE, mod PI, PASP 38 mmHg  . HYPERLIPIDEMIA   . HYPERTENSION   . OSTEOPOROSIS   . VERTIGO    Social History   Socioeconomic History  . Marital status: Widowed    Spouse name: Not on file  . Number of children: Not on file  . Years of education: Not on file  . Highest education level: Not on file  Occupational History    Employer: RETIRED    Comment: Retired  Tobacco Use  . Smoking status: Never Smoker  . Smokeless tobacco: Never Used  Substance and Sexual Activity  . Alcohol use: No  . Drug use: No  . Sexual activity: Never  Other Topics Concern  . Not on file  Social History Narrative   Regular exercise-yes   Social Determinants of Health   Financial Resource Strain:   . Difficulty of Paying  Living Expenses: Not on file  Food Insecurity:   . Worried About Programme researcher, broadcasting/film/videounning Out of Food in the Last Year: Not on file  . Ran Out of Food in the Last Year: Not on file  Transportation Needs:   . Lack of Transportation (Medical): Not on file  . Lack of Transportation (Non-Medical): Not on file  Physical Activity:   . Days of Exercise per Week: Not on file  . Minutes of Exercise per Session: Not on file  Stress:   . Feeling of Stress : Not on file  Social Connections:   . Frequency of Communication with Friends and Family: Not on file  . Frequency of Social Gatherings with Friends and Family:  Not on file  . Attends Religious Services: Not on file  . Active Member of Clubs or Organizations: Not on file  . Attends Banker Meetings: Not on file  . Marital Status: Not on file   Family History  Problem Relation Age of Onset  . Diabetes Mother   . Heart attack Mother   . Sudden death Mother   . Hypertension Other   . Heart disease Other   . Coronary artery disease Other   . Hypertension Daughter    Scheduled Meds: .  stroke: mapping our early stages of recovery book   Does not apply Once  . aspirin EC  81 mg Oral Daily  . clopidogrel  75 mg Oral Daily  . enoxaparin (LOVENOX) injection  40 mg Subcutaneous Daily   Continuous Infusions: . sodium chloride 75 mL/hr at 11/20/19 2347   PRN Meds:.acetaminophen **OR** acetaminophen (TYLENOL) oral liquid 160 mg/5 mL **OR** acetaminophen, hydrALAZINE, senna-docusate Medications Prior to Admission:  Prior to Admission medications   Medication Sig Start Date End Date Taking? Authorizing Provider  acetaminophen (TYLENOL) 325 MG tablet Take 2 tablets (650 mg total) by mouth every 4 (four) hours as needed for headache or mild pain. 05/29/14  Yes Kilroy, Luke K, PA-C  acetaminophen (TYLENOL) 500 MG tablet Take 500 mg by mouth 2 (two) times daily.   Yes [provider]  amLODipine (NORVASC) 5 MG tablet Take 0.5 tablets (2.5 mg  total) by mouth daily. 06/01/18  Yes Plotnikov, Georgina Quint, MD  atorvastatin (LIPITOR) 20 MG tablet Take 0.5 tablets (10 mg total) by mouth daily. Patient taking differently: Take 10 mg by mouth at bedtime.  06/01/18  Yes Plotnikov, Georgina Quint, MD  carvedilol (COREG) 12.5 MG tablet TAKE 1 TABLET BY MOUTH TWICE DAILY WITH A MEAL Patient taking differently: Take 12.5 mg by mouth 2 (two) times daily with a meal.  06/01/18  Yes Plotnikov, Georgina Quint, MD  Cholecalciferol ([employer] VITAMIN D3) 1000 UNITS capsule Take 1 capsule (1,000 Units total) by mouth daily. 12/19/14  Yes Plotnikov, Georgina Quint, MD  clopidogrel (PLAVIX) 75 MG tablet Take 1 tablet (75 mg total) by mouth daily. 06/01/18  Yes Plotnikov, Georgina Quint, MD  Memantine HCl-Donepezil HCl (NAMZARIC) 28-10 MG CP24 Take 1 capsule by mouth daily. 06/01/18  Yes Plotnikov, Georgina Quint, MD  mirtazapine (REMERON) 15 MG tablet Take 1 tablet (15 mg total) by mouth at bedtime. 06/01/18  Yes Plotnikov, Georgina Quint, MD  nitroGLYCERIN (NITROSTAT) 0.4 MG SL tablet Place 1 tablet (0.4 mg total) under the tongue every 5 (five) minutes as needed for chest pain. 01/05/18  Yes Plotnikov, Georgina Quint, MD  potassium chloride (KLOR-CON) 8 MEQ tablet Take 1 tablet (8 mEq total) by mouth daily. Patient not taking: Reported on 11/17/2019 12/07/18 12/07/19  Plotnikov, Georgina Quint, MD   Allergies  Allergen Reactions  . Simvastatin Other (See Comments)    REACTION: memory problem, myalgia   Review of Systems  Unable to perform ROS: Acuity of condition    Physical Exam Constitutional:      Appearance: She is normal weight.  Cardiovascular:     Rate and Rhythm: Normal rate.  Pulmonary:     Effort: Tachypnea present.  Skin:    General: Skin is warm and dry.  Neurological:     Mental Status: She is lethargic.     Cranial Nerves: Cranial nerve deficit and dysarthria present.     Motor: Weakness present.     Vital Signs: BP [name])  169/82 (BP Location: Right Arm)   Pulse 88   Temp 99.5 F (37.5 C)  (Oral)   Resp 18   Wt 66.6 kg   SpO2 95%   BMI 26.85 kg/m  Pain Scale: 0-10   Pain Score: Asleep   SpO2: SpO2: 95 % O2 Device:SpO2: 95 % O2 Flow Rate: .   IO: Intake/output summary:   Intake/Output Summary (Last 24 hours) at 11/21/2019 1000 Last data filed at 11/21/2019 0900 Gross per 24 hour  Intake 407.68 ml  Output 1350 ml  Net -942.32 ml    LBM: Last BM Date: 11/19/19 Baseline Weight: Weight: 66.6 kg Most recent weight: Weight: 66.6 kg     Palliative Assessment/Data: 20 %   Discussed with Dr Gerri Lins  Time In:   1500 Time Out: 1615  Time Total: 75 minutes Greater than 50%  of this time was spent counseling and coordinating care related to the above assessment and plan.  Signed by: Lorinda Creed, NP   Please contact Palliative Medicine Team phone at [phone] for questions and concerns.  For individual provider: See Loretha Stapler

## 2019-11-21 NOTE — Progress Notes (Signed)
STROKE TEAM PROGRESS NOTE   INTERVAL HISTORY Pt lying in bed, globally aphasic, left gaze preference, not following commands but moving all extremities symmetrically.   Did pass swallow, but patient appears to regress as not swallowing well.  Daughter wants to hold off on the contracting for now. Vitals:   11/21/19 0404 11/21/19 0410 11/21/19 0858 11/21/19 1235  BP:   (!) 169/82 (!) 169/80  Pulse:   88 95  Resp: 20 20 18 19   Temp:   99.5 F (37.5 C) 98.4 F (36.9 C)  TempSrc:   Oral Oral  SpO2:   95% 95%  Weight:        CBC:  Recent Labs  Lab 11/18/19 0247 11/19/19 0547 11/20/19 0431  WBC 9.0 10.7* 11.7*  NEUTROABS 5.5 6.7  --   HGB 14.6 14.5 14.2  HCT 45.7 45.4 43.4  MCV 91.6 91.5 89.7  PLT 259 293 408    Basic Metabolic Panel:  Recent Labs  Lab 11/17/19 0210 11/20/19 0431 11/21/19 0240  NA  --  143 142  K  --  3.1* 3.1*  CL  --  110 111  CO2  --  20* 21*  GLUCOSE  --  127* 119*  BUN  --  11 13  CREATININE  --  0.73 0.73  CALCIUM  --  8.3* 8.2*  MG 2.6*  --   --    Lipid Panel:     Component Value Date/Time   CHOL 139 11/17/2019 0341   TRIG 272 (H) 11/17/2019 0341   HDL 26 (L) 11/17/2019 0341   CHOLHDL 5.3 11/17/2019 0341   VLDL 54 (H) 11/17/2019 0341   LDLCALC 59 11/17/2019 0341   HgbA1c:  Lab Results  Component Value Date   HGBA1C 6.9 (H) 11/17/2019   Urine Drug Screen:     Component Value Date/Time   LABOPIA NONE DETECTED 11/17/2019 0213   COCAINSCRNUR NONE DETECTED 11/17/2019 0213   LABBENZ NONE DETECTED 11/17/2019 0213   AMPHETMU NONE DETECTED 11/17/2019 0213   THCU NONE DETECTED 11/17/2019 0213   LABBARB NONE DETECTED 11/17/2019 0213    Alcohol Level     Component Value Date/Time   ETH <10 11/17/2019 0210    IMAGING  MR ANGIO HEAD WO CONTRAST 11/17/2019 IMPRESSION:   MRI brain:  1. Acute/early subacute infarct involving the anterolateral left frontal lobe, lateral left basal ganglia and anterior left insula with associated  petechial hemorrhage, as described.  2. Mild associated mass effect with minimal effacement of the left lateral ventricle frontal horn.  3. Generalized parenchymal atrophy with moderate chronic small vessel ischemic disease. Chronic right cerebellar lacunar infarct.   MRA head:  1. High-grade focal stenosis within a superior division proximal M2 left MCA branch shortly beyond its origin. There is also segmental near occlusive or occlusive stenosis within a proximal left M2 branch more distally.  2. Moderate focal stenosis at the origin of the right anterior right anterior cerebral artery.  3. Additional intracranial atherosclerotic disease as described with no other significant proximal arterial stenosis identified.  MR Angiogram Neck W or Wo Contrast 11/17/2019 IMPRESSION:  The common carotid, internal carotid and vertebral arteries are patent within the neck without significant stenosis  ECHOCARDIOGRAM COMPLETE 11/17/2019 IMPRESSIONS   1. Left ventricular ejection fraction, by visual estimation, is 60 to 65%. The left ventricle has normal function. Left ventricular septal wall thickness was mildly increased. There is no left ventricular hypertrophy.   2. Left ventricular diastolic parameters are consistent with  Grade I diastolic dysfunction (impaired relaxation).   3. Global right ventricle has normal systolic function.The right ventricular size is normal. No increase in right ventricular wall thickness.   4. Left atrial size was normal.   5. Right atrial size was normal.   6. The mitral valve is normal in structure. Mild mitral valve regurgitation. No evidence of mitral stenosis.   7. The tricuspid valve is normal in structure. Tricuspid valve regurgitation is mild.   8. The aortic valve is normal in structure. Aortic valve regurgitation is trivial. No evidence of aortic valve sclerosis or stenosis.   9. The pulmonic valve was normal in structure. Pulmonic valve regurgitation is trivial.   10. Mildly elevated pulmonary artery systolic pressure. 1 1. The inferior vena cava is normal in size with greater than 50% respiratory variability, suggesting right atrial pressure of 3 mmHg.     VAS US LOWER EXTREMITY VENOUS (DVT) 11/18/2019 Summary:  Right: There is no evidence of deep vein thrombosis in the lower extremity. However, portions of this examination were limited- see technologist comments above. No cystic structure found in the popliteal fossa.  Left: There is no evidence of deep vein thrombosis in the lower extremity. No cystic structure found in the popliteal fossa.    PHYSICAL EXAM  Temp:  [98.2 F (36.8 C)-99.5 F (37.5 C)] 98.4 F (36.9 C) (12/22 1235) Pulse Rate:  [78-95] 95 (12/22 1235) Resp:  [17-23] 19 (12/22 1235) BP: (144-169)/(57-82) 169/80 (12/22 1235) SpO2:  [94 %-97 %] 95 % (12/22 1235)  General - Well nourished, well developed, in no apparent distress, lethargic.  Ophthalmologic - fundi not visualized due to noncooperation.  Cardiovascular - Regular rhythm and rate.  Neuro - lethargic and eyes closed, however easily open with stimulation.  Global aphasia, no speech output, not following commands.  Left gaze preference, barely cross midline to the right.  Blinking to visual threat on the left and right lower quadrant, not right upper quadrant.  Not tracking on the right.  PERRL.  Right facial droop.  Tongue protrusion not corporative.  Bilateral upper extremity 3/5, symmetrical, bilateral lower extremity 3/5 with pain stimulation.  DTR 1+, no Babinski. Sensation, coordination and gait not tested.    ASSESSMENT/PLAN [name] is a 85 y.o. female with history of  dementia, coronary artery disease, hypertension hyperlipidemia [facility] with multiple days of lethargy, confusion and not acting herself.  Stroke:  left MCA moderate infarct in setting of L M2 stenosis, could be large vessel disease but cannot rule out  cardioembolic source CT head acute/subacute L frontal lobe infarct. Dense L M2. Small vessel disease. Moderate Atrophy.   MRI  Anterolateral L frontal lobe, L basal ganglia and anterior L insular infarcts w/ hemorrhagic transformation. Mild mass effect and effacements L lateral ventricle. Small vessel disease. Atrophy. Old R cerebellar infarct    MRA head  High-grade superior proximal L M2 branch stenosis, near-occlusive L M2 distal stenosis. Moderate R ACA stenosis. Intracranial atherosclerosis.  MRA neck Unremarkable   2D Echo EF 60-65%. No source of embolus   LE venous doppler - negative for DVT  May consider 30 day cardiac event monitoring as outpt to rule out afib if above work up negative  LDL 59  HgbA1c 6.9  Lovenox 40 mg sq daily for VTE prophylaxis  clopidogrel 75 mg daily prior to admission, now on aspirin 300 mg suppository daily.  Start DAPT now that she is able to swallow  Therapy recommendations:  SNF  Disposition:  pending   Dysphagia  Did not pass swallow  NPO  Put on IVF @ 77  had cortrak -now able to swallow consider po meds once po access  Consider TF once cortrak placed  Hypertension  Stable . Permissive hypertension (OK if < 220/120) but gradually normalize in 3-5 days . BP within range now . Long-term BP goal normotensive  Hyperlipidemia  Home meds:  lipitor 20  Resume statin in hospital once po access  LDL 59, goal < 70  Continue statin at discharge  Likely Pre-Diabetes  HgbA1c 6.9, goal < 7.0  SSI  CBG monitoring  Other Stroke Risk Factors  Advanced age  Coronary artery disease s/p PTCA  Hx ischemic cardiomyopathy - EF 30% in 2002 currently EF 60--65%  Other Active Problems  Baseline dementia  Slightly elevated troponin 22->22 insignificant  Hypokalemia 3.3 - supplement - 3.0 - supplement further and recheck in AM  Mild lactic acidosis   WBC - 10.7 ; Temp - 99.1 (U/A and CXR 12/18 -> OK)  DNR  Hospital day #  4 Watch p.o. intake to ensure that it is adequate.    Expect transfer to skilled nursing facility if condition improves over the next few days Stroke team will sign off. Call for questions  Dr.Swayze  Delia Heady, MD  To contact Stroke Continuity provider, please refer to [email or web address]. After hours, contact General Neurology

## 2019-11-22 DIAGNOSIS — R0600 Dyspnea, unspecified: Secondary | ICD-10-CM

## 2019-11-22 DIAGNOSIS — R0609 Other forms of dyspnea: Secondary | ICD-10-CM

## 2019-11-22 DIAGNOSIS — Z515 Encounter for palliative care: Secondary | ICD-10-CM

## 2019-11-22 DIAGNOSIS — R778 Other specified abnormalities of plasma proteins: Secondary | ICD-10-CM

## 2019-11-22 MED ORDER — MORPHINE BOLUS VIA INFUSION
1.0000 mg | INTRAVENOUS | Status: DC | PRN
  Filled 2019-11-22: qty 1

## 2019-11-22 MED ORDER — MORPHINE 100MG IN NS 100ML (1MG/ML) PREMIX INFUSION: 1.0000 mg/h | INTRAVENOUS | Status: DC

## 2019-11-22 MED ORDER — MORPHINE SULFATE (PF) 2 MG/ML IV SOLN
2.0000 mg | INTRAVENOUS | Status: DC | PRN
  Administered 2019-11-22 (×3): 2 mg via INTRAVENOUS
  Filled 2019-11-22 (×3): qty 1

## 2019-12-01 NOTE — Progress Notes (Signed)
PROGRESS NOTE    MARTINA BRODBECK  ZOX:[id number] DOB: 02/04/1927 DOA: 11/04/2019 PCP: Tresa Garter, MD   Brief Narrative: BONITA BRINDISI is a 86 y.o. femalewith medical history significant ofdementia, CAD status post PCI, ischemic cardiomyopathy, hypertension, hyperlipidemia, osteoporosis depression, anxiety. She presented secondary to altered mental status and found to have an acute/subacute infarct. Patient has not progressed well and is currently comfort measures.   Assessment & Plan:   Principal Problem:   Acute CVA (cerebrovascular accident) [facility]) Active Problems:   Hypertension   Lactic acidosis   Elevated troponin   Hypokalemia   Palliative care by specialist   DNR (do not resuscitate)   Agitation   Acute/subacute ischemic infarct of the left frontal lobe  Head CT showing findings concerning for an acute or subacute infarct involving the left frontal lobe. No acute intracranial hemorrhage. Patient was seen by neurology and deemed not to be a candidate for TPA due to established hypodensity and not a candidate for endovascular thrombectomy due to symptoms ongoing for multiple days. MRA demonstrated likely thrombusof the left M2 segment of the left MCA. Echocardiogram was obtained that demonstrated. It demonstrated left ventricular ejection fraction of 60-65% with Grade 1 DD. No intracardiac source of thrombus was seen. She was started on Aspirin 300 mg PR. She was not started on a statin due to intolerance of statins (myalgias). She has dense expressive aphasia and dysphagia. Initially SLP evaluated and determined that her swallow was not safe and that alternative means for nutrition should be sought.The patient has been re-evaluated by SLP. Although on 11/19/2019 it appeared that the patient would be able to tolerate a DYS1 diet, she appears to have regressed. After goals of care discussions, decision was made to transition to comfort measures only.  Dysphagia  As  mentioned above. Comfort measures  Tachypnea  Will likely need to increase morphine for tachypnea. Discussed with nurse and will discussed with palliative care medicine  Mild lactic acidosis Resolved. Likely secondary to dehydration. No fever or leukocytosis to suggest underlying infection. Chest x-ray not suggestive of pneumonia.  Mildly elevated troponin Troponin trending down. EKG without ischemic changes.Does have a history of CAD status post PCI, however, EKG not suggestive of ACS. Comfort measures.  Mild hypokalemia Supplemented.  Hypertension Comfort measures   DVT prophylaxis: Comfort measures. No prophylaxis. Code Status:   Code Status: DNR Family Communication: None Disposition Plan: Anticipate hospital death   Consultants:   Palliative care medicine  Neurology  Procedures:   12/18: Transthoracic Echocardiogram IMPRESSIONS    1. Left ventricular ejection fraction, by visual estimation, is 60 to 65%. The left ventricle has normal function. Left ventricular septal wall thickness was mildly increased. There is no left ventricular hypertrophy.  2. Left ventricular diastolic parameters are consistent with Grade I diastolic dysfunction (impaired relaxation).  3. Global right ventricle has normal systolic function.The right ventricular size is normal. No increase in right ventricular wall thickness.  4. Left atrial size was normal.  5. Right atrial size was normal.  6. The mitral valve is normal in structure. Mild mitral valve regurgitation. No evidence of mitral stenosis.  7. The tricuspid valve is normal in structure. Tricuspid valve regurgitation is mild.  8. The aortic valve is normal in structure. Aortic valve regurgitation is trivial. No evidence of aortic valve sclerosis or stenosis.  9. The pulmonic valve was normal in structure. Pulmonic valve regurgitation is trivial. 10. Mildly elevated pulmonary artery systolic pressure. 11. The inferior vena cava  is normal in size with greater than 50% respiratory variability, suggesting right atrial pressure of 3 mmHg.  Antimicrobials:  None    Subjective: Unable to obtain secondary to mental status  Objective: Vitals:   11/21/19 1235 11/21/19 1702 11/21/19 2028 11/01/2019 0820  BP: (!) 169/80 (!) 164/77 (!) 161/100 (!) 186/105  Pulse: 95 92 97 (!) 116  Resp: 19 20 18  (!) 24  Temp: 98.4 F (36.9 C) 97.7 F (36.5 C) 97.7 F (36.5 C) 99.8 F (37.7 C)  TempSrc: Oral Oral Oral Oral  SpO2: 95% 97% 93% 93%  Weight:        Intake/Output Summary (Last 24 hours) at 11/29/2019 1000 Last data filed at 11/21/2019 1707 Gross per 24 hour  Intake 1583.25 ml  Output 400 ml  Net 1183.25 ml   Filed Weights   11/17/19 2357  Weight: 66.6 kg    Examination:  General exam: Appears calm Respiratory system: Clear to auscultation. Significant tachypnea with accessory muscle usage Central nervous system: Does not respond to verbal/tactile stimulation   Data Reviewed: I have personally reviewed following labs and imaging studies  CBC: Recent Labs  Lab October 09, 2019 2317 11/18/19 0247 11/19/19 0547 11/20/19 0431  WBC 6.9 9.0 10.7* 11.7*  NEUTROABS 4.2 5.5 6.7  --   HGB 16.1* 14.6 14.5 14.2  HCT 50.4* 45.7 45.4 43.4  MCV 92.3 91.6 91.5 89.7  PLT 210 259 293 281   Basic Metabolic Panel: Recent Labs  Lab 11/17/19 0210 11/17/19 1445 11/18/19 0247 11/19/19 0547 11/20/19 0431 11/21/19 0240  NA  --  147* 147* 146* 143 142  K  --  2.8* 2.9* 3.0* 3.1* 3.1*  CL  --  117* 112* 113* 110 111  CO2  --  16* 21* 20* 20* 21*  GLUCOSE  --  117* 138* 133* 127* 119*  BUN  --  17 16 13 11 13   CREATININE  --  0.73 0.94 0.76 0.73 0.73  CALCIUM  --  7.0* 8.4* 8.4* 8.3* 8.2*  MG 2.6*  --   --   --   --   --    GFR: Estimated Creatinine Clearance: 41.8 mL/min (by C-G formula based on SCr of 0.73 mg/dL). Liver Function Tests: Recent Labs  Lab October 09, 2019 2317  AST 32  ALT 22  ALKPHOS 71  BILITOT 0.7   PROT 8.4*  ALBUMIN 3.6   No results for input(s): LIPASE, AMYLASE in the last 168 hours. No results for input(s): AMMONIA in the last 168 hours. Coagulation Profile: Recent Labs  Lab 11/17/19 0153  INR 1.0   Cardiac Enzymes: No results for input(s): CKTOTAL, CKMB, CKMBINDEX, TROPONINI in the last 168 hours. BNP (last 3 results) No results for input(s): PROBNP in the last 8760 hours. HbA1C: No results for input(s): HGBA1C in the last 72 hours. CBG: No results for input(s): GLUCAP in the last 168 hours. Lipid Profile: No results for input(s): CHOL, HDL, LDLCALC, TRIG, CHOLHDL, LDLDIRECT in the last 72 hours. Thyroid Function Tests: No results for input(s): TSH, T4TOTAL, FREET4, T3FREE, THYROIDAB in the last 72 hours. Anemia Panel: No results for input(s): VITAMINB12, FOLATE, FERRITIN, TIBC, IRON, RETICCTPCT in the last 72 hours. Sepsis Labs: Recent Labs  Lab October 09, 2019 2234 11/17/19 0200  LATICACIDVEN 2.2* 1.6    Recent Results (from the past 240 hour(s))  Culture, blood (routine x 2)     Status: None   Collection Time: October 09, 2019 10:20 PM   Specimen: BLOOD  Result Value Ref Range Status  Specimen Description BLOOD LEFT ARM  Final   Special Requests   Final    BOTTLES DRAWN AEROBIC AND ANAEROBIC Blood Culture adequate volume   Culture   Final    NO GROWTH 5 DAYS Performed at [facility], [address], [place], [place] [address]    Report Status 11/21/2019 FINAL  Final  Urine culture     Status: None   Collection Time: 11/17/19  2:13 AM   Specimen: Urine, Catheterized  Result Value Ref Range Status   Specimen Description URINE, CATHETERIZED  Final   Special Requests NONE  Final   Culture   Final    NO GROWTH Performed at [facility], [address], [place], [place] [address]    Report Status 11/17/2019 FINAL  Final  SARS CORONAVIRUS 2 (TAT 6-24 HRS) Nasopharyngeal Nasopharyngeal Swab     Status: None   Collection Time: 11/17/19  2:13 AM    Specimen: Nasopharyngeal Swab  Result Value Ref Range Status   SARS Coronavirus 2 NEGATIVE NEGATIVE Final    Comment: (NOTE) SARS-CoV-2 target nucleic acids are NOT DETECTED. The SARS-CoV-2 RNA is generally detectable in upper and lower respiratory specimens during the acute phase of infection. Negative results do not preclude SARS-CoV-2 infection, do not rule out co-infections with other pathogens, and should not be used as the sole basis for treatment or other patient management decisions. Negative results must be combined with clinical observations, patient history, and epidemiological information. The expected result is Negative. Fact Sheet for Patients: HairSlick.no Fact Sheet for Healthcare Providers: quierodirigir.com This test is not yet approved or cleared by the [place] FDA and  has been authorized for detection and/or diagnosis of SARS-CoV-2 by FDA under an Emergency Use Authorization (EUA). This EUA will remain  in effect (meaning this test can be used) for the duration of the COVID-19 declaration under Section 56 4(b)(1) of the Act, 21 U.S.C. section 360bbb-3(b)(1), unless the authorization is terminated or revoked sooner. Performed at [facility], [address] [place], [place] [address]   MRSA PCR Screening     Status: None   Collection Time: 11/18/19 12:09 AM   Specimen: Nasal Mucosa; Nasopharyngeal  Result Value Ref Range Status   MRSA by PCR NEGATIVE NEGATIVE Final    Comment:        The GeneXpert MRSA Assay (FDA approved for NASAL specimens only), is one component of a comprehensive MRSA colonization surveillance program. It is not intended to diagnose MRSA infection nor to guide or monitor treatment for MRSA infections. Performed at [facility], [address], [place], [place] [address]   SARS CORONAVIRUS 2 (TAT 6-24 HRS) Nasopharyngeal Nasopharyngeal Swab     Status: None    Collection Time: 11/19/19  3:29 PM   Specimen: Nasopharyngeal Swab  Result Value Ref Range Status   SARS Coronavirus 2 NEGATIVE NEGATIVE Final    Comment: (NOTE) SARS-CoV-2 target nucleic acids are NOT DETECTED. The SARS-CoV-2 RNA is generally detectable in upper and lower respiratory specimens during the acute phase of infection. Negative results do not preclude SARS-CoV-2 infection, do not rule out co-infections with other pathogens, and should not be used as the sole basis for treatment or other patient management decisions. Negative results must be combined with clinical observations, patient history, and epidemiological information. The expected result is Negative. Fact Sheet for Patients: HairSlick.no Fact Sheet for Healthcare Providers: quierodirigir.com This test is not yet approved or cleared by the [place] FDA and  has been authorized for detection and/or diagnosis of SARS-CoV-2 by FDA under an Emergency Use Authorization (EUA). This EUA will remain  in effect (meaning this test can be used) for the duration of the COVID-19 declaration under Section 56 4(b)(1) of the Act, 21 U.S.C. section 360bbb-3(b)(1), unless the authorization is terminated or revoked sooner. Performed at [facility], [place] [address], [place], [place] [address]          Radiology Studies: No results found.      Scheduled Meds: .  stroke: mapping our early stages of recovery book   Does not apply Once   Continuous Infusions:   LOS: 5 days     Cordelia Poche, MD [facility] 11/07/2019, 10:00 AM  If 7PM-7AM, please contact night-coverage [email or web address]

## 2019-12-01 NOTE — Progress Notes (Signed)
Nutrition Brief Note  Chart reviewed. Pt currently comfort care.  No further nutrition interventions warranted at this time.  Please re-consult as needed.   Corrin Parker, MS, RD, LDN Pager # [phone] After hours/ weekend pager # [phone]

## 2019-12-01 NOTE — Progress Notes (Signed)
Speech Pathology:  Pt has transitioned to full comfort care.  Our service will respectfully sign off.  Rockwell Zentz L. Tivis Ringer, [place] Office number [phone] Pager [phone]

## 2019-12-01 NOTE — Social Work (Signed)
CSW acknowledging pt under comfort care. Will follow for disposition should hospice services or residential hospice become appropriate.   Corrisa Gibby H Longino Trefz, LCSWA [place] [facility] Social Work   

## 2019-12-01 NOTE — Death Summary Note (Signed)
DEATH SUMMARY   Patient Details  Name: [name] MRN: [id number] DOB: 11-27-1927  Admission/Discharge Information   Admit Date:  11/15/2019  Date of Death: Date of Death: 2018/12/14  Time of Death: Time of Death: 1435  Length of Stay: 5  Referring Physician: Tresa GarterPlotnikov, Aleksei V, MD   Reason(s) for Hospitalization  Altered mental status  Diagnoses  Preliminary cause of death:  Secondary Diagnoses (including complications and co-morbidities):  Principal Problem:   Acute CVA (cerebrovascular accident) [facility](HCC) Active Problems:   Hypertension   Lactic acidosis   Elevated troponin   Hypokalemia   Palliative care by specialist   DNR (do not resuscitate)   Agitation   Comfort measures only status   Dyspnea   Brief Hospital Course (including significant findings, care, treatment, and services provided and events leading to death)  [name] is a 86 y.o. year old female with medical history significant ofdementia, CAD status post PCI, ischemic cardiomyopathy, hypertension, hyperlipidemia, osteoporosis depression, anxiety. She presented secondary to altered mental status and found to have an acute/subacute infarct. Patient has not progressed well and is currently comfort measures. Last progress note:  Acute/subacute ischemic infarct of the left frontal lobe  Head CT showing findings concerning for an acute or subacute infarct involving the left frontal lobe. No acute intracranial hemorrhage. Patient was seen by neurology and deemed not to be a candidate for TPA due to established hypodensity and not a candidate for endovascular thrombectomy due to symptoms ongoing for multiple days. MRA demonstrated likely thrombusof the left M2 segment of the left MCA. Echocardiogram was obtained that demonstrated. It demonstrated left ventricular ejection fraction of 60-65% with Grade 1 DD. No intracardiac source of thrombus was seen. She was started on Aspirin 300 mg PR. She was not started on a  statin due to intolerance of statins (myalgias). She has dense expressive aphasia and dysphagia. Initially SLP evaluated and determined that her swallow was not safe and that alternative means for nutrition should be sought.The patient has been re-evaluated by SLP. Although on 11/19/2019 it appeared that the patient would be able to tolerate a DYS1 diet, she appears to have regressed. After goals of care discussions, decision was made to transition to comfort measures only.  Dysphagia  As mentioned above. Comfort measures  Tachypnea  Will likely need to increase morphine for tachypnea. Discussed with nurse and will discussed with palliative care medicine  Mild lactic acidosis Resolved.Likely secondary to dehydration. No fever or leukocytosis to suggest underlying infection. Chest x-ray not suggestive of pneumonia.  Mildly elevated troponin Troponin trending down. EKG without ischemic changes.Does have a history of CAD status post PCI, however, EKG not suggestive of ACS. Comfort measures.  Mild hypokalemia Supplemented.  Hypertension Comfort measures   Pertinent Labs and Studies  Significant Diagnostic Studies DG Chest 1 View  Result Date: 11/08/2019 CLINICAL DATA:  Hypoxic, COVID-19 positive, history of COPD EXAM: CHEST  1 VIEW COMPARISON:  Radiograph 12/06/2018 FINDINGS: There are coarse basilar interstitial changes similar to comparison exam. Some additional streaky opacities in the lung bases are present with associated volume loss. No pneumothorax or effusion. No convincing features of edema. The cardiomediastinal contours are unremarkable. No acute osseous or soft tissue abnormality. Degenerative changes are present in the imaged spine. IMPRESSION: Findings are compatible with chronic interstitial lung changes. Atelectasis or superimposed infiltrate at the lung bases cannot be excluded. Electronically Signed   By: Kreg ShropshirePrice  DeHay M.D.   On: 11/08/2019 18:57   DG Abdomen 1  View  Result Date: 11/17/2019 CLINICAL DATA:  Initial evaluation for MRI clearance. EXAM: ABDOMEN - 1 VIEW COMPARISON:  None. FINDINGS: Bowel gas pattern within normal limits without obstruction or ileus. No abnormal bowel wall thickening. No soft tissue mass or abnormal calcification. No metallic implant or radiopaque foreign body. Dextroscoliosis with advanced multilevel degenerative spondylosis noted within the lumbar spine. IMPRESSION: 1. No metallic implant or other contraindication for MRI within the abdomen. 2. Nonobstructive bowel gas pattern. Electronically Signed   By: Rise Mu M.D.   On: 11/17/2019 05:25   CT Head Wo Contrast  Result Date: 11/17/2019 CLINICAL DATA:  Encephalopathy. EXAM: CT HEAD WITHOUT CONTRAST TECHNIQUE: Contiguous axial images were obtained from the base of the skull through the vertex without intravenous contrast. COMPARISON:  December 02, 2017 FINDINGS: Brain: There is loss of gray-white differentiation involving approximately 5 cm area involving the left frontal lobe. There is no midline shift. No acute intracranial hemorrhage. Moderate atrophy and chronic microvascular ischemic changes are noted. Vascular: The left M2 is asymmetrically dense concerning for thrombus. Skull: Normal. Negative for fracture or focal lesion. Sinuses/Orbits: No acute finding. Other: None. IMPRESSION: 1. Findings concerning for an acute or subacute infarct involving the left frontal lobe as detailed above. 2. Asymmetric a dense left M2 concerning for thrombus. 3. No acute intracranial hemorrhage. 4. Moderate atrophy and chronic microvascular ischemic changes. These results were called by telephone at the time of interpretation on 11/17/2019 at 12:37 am to provider [facility] , who verbally acknowledged these results. Electronically Signed   By: Katherine Mantle M.D.   On: 11/17/2019 00:42   MR ANGIO HEAD WO CONTRAST  Result Date: 11/17/2019 CLINICAL DATA:  Stroke, follow-up.  Additional history provided: Altered mental status. EXAM: MRI HEAD WITHOUT CONTRAST MRA HEAD WITHOUT CONTRAST TECHNIQUE: Multiplanar, multiecho pulse sequences of the brain and surrounding structures were obtained without intravenous contrast. Angiographic images of the head were obtained using MRA technique without contrast. COMPARISON:  Head CT 11/17/2019 FINDINGS: MRI HEAD FINDINGS Brain: Multiple sequences are motion degraded. There is a region of cortical/subcortical restricted diffusion within the anterolateral left frontal lobes measuring 5.5 x 3.4 cm in transaxial dimensions, consistent with acute/early subacute infarct. The infarct also extends to involve portions of the lateral left basal ganglia and anterior left insula. Associated T2/FLAIR hyperintensity at the infarct site. There is also SWI signal loss throughout the infarct territory consistent with petechial hemorrhage. Regional mass effect with minimal effacement of the left lateral ventricle frontal horn. No midline shift or extra-axial fluid collection. Background of moderate chronic small vessel ischemic disease. Chronic lacunar infarct within the right cerebellum. Mild generalized parenchymal atrophy. Vascular: Reported separately Skull and upper cervical spine: No focal marrow lesion. Sinuses/Orbits: Visualized orbits demonstrate no acute abnormality. Mild ethmoid sinus mucosal thickening. Small bilateral mastoid effusions. MRA HEAD FINDINGS The intracranial internal carotid arteries are patent. Mild atherosclerotic irregularity of these vessels without significant stenosis. The right middle cerebral artery is patent without significant proximal stenosis. The right anterior cerebral artery is patent. Mild-to-moderate focal stenosis at the origin of the right anterior cerebral artery. The M1 left middle cerebral artery is patent without significant stenosis. High-grade focal stenosis within a superior division proximal M2 left middle cerebral  artery branch shortly beyond its origin (series 1018, image 13). There is also segmental near occlusive or occlusive stenosis within a proximal left M2 branch more distally. The left anterior cerebral artery is patent without significant proximal stenosis. The intracranial vertebral arteries are patent  without significant stenosis. The left vertebral artery is developmentally diminutive beyond the origin of the left PICA. The basilar artery is patent without significant stenosis. Predominantly fetal origin of the bilateral posterior cerebral arteries, which are patent without significant proximal stenosis. No intracranial aneurysm is identified. IMPRESSION: MRI brain: 1. Acute/early subacute infarct involving the anterolateral left frontal lobe, lateral left basal ganglia and anterior left insula with associated petechial hemorrhage, as described. 2. Mild associated mass effect with minimal effacement of the left lateral ventricle frontal horn. 3. Generalized parenchymal atrophy with moderate chronic small vessel ischemic disease. Chronic right cerebellar lacunar infarct. MRA head: 1. High-grade focal stenosis within a superior division proximal M2 left MCA branch shortly beyond its origin. There is also segmental near occlusive or occlusive stenosis within a proximal left M2 branch more distally. 2. Moderate focal stenosis at the origin of the right anterior right anterior cerebral artery. 3. Additional intracranial atherosclerotic disease as described with no other significant proximal arterial stenosis identified. Electronically Signed   By: Jackey Loge DO   On: 11/17/2019 08:19   MR Angiogram Neck W or Wo Contrast  Result Date: 11/17/2019 CLINICAL DATA:  Stroke, follow-up. EXAM: MRA NECK WITHOUT AND WITH CONTRAST TECHNIQUE: Multiplanar and multiecho pulse sequences of the neck were obtained without and with intravenous contrast. Angiographic images of the neck were obtained using MRA technique without and  with intravenous contrast. CONTRAST:  7mL GADAVIST GADOBUTROL 1 MMOL/ML IV SOLN COMPARISON:  Same-day MRI/MRA head 11/17/2019 FINDINGS: Standard aortic branching. The visualized aortic arch and proximal major branch vessels are unremarkable. The bilateral common and internal carotid arteries are patent within the neck without significant stenosis. The vertebral arteries are codominant and patent within the neck without significant stenosis. IMPRESSION: The common carotid, internal carotid and vertebral arteries are patent within the neck without significant stenosis. Electronically Signed   By: Jackey Loge DO   On: 11/17/2019 08:25   MR BRAIN WO CONTRAST  Result Date: 11/17/2019 CLINICAL DATA:  Stroke, follow-up. Additional history provided: Altered mental status. EXAM: MRI HEAD WITHOUT CONTRAST MRA HEAD WITHOUT CONTRAST TECHNIQUE: Multiplanar, multiecho pulse sequences of the brain and surrounding structures were obtained without intravenous contrast. Angiographic images of the head were obtained using MRA technique without contrast. COMPARISON:  Head CT 11/17/2019 FINDINGS: MRI HEAD FINDINGS Brain: Multiple sequences are motion degraded. There is a region of cortical/subcortical restricted diffusion within the anterolateral left frontal lobes measuring 5.5 x 3.4 cm in transaxial dimensions, consistent with acute/early subacute infarct. The infarct also extends to involve portions of the lateral left basal ganglia and anterior left insula. Associated T2/FLAIR hyperintensity at the infarct site. There is also SWI signal loss throughout the infarct territory consistent with petechial hemorrhage. Regional mass effect with minimal effacement of the left lateral ventricle frontal horn. No midline shift or extra-axial fluid collection. Background of moderate chronic small vessel ischemic disease. Chronic lacunar infarct within the right cerebellum. Mild generalized parenchymal atrophy. Vascular: Reported separately  Skull and upper cervical spine: No focal marrow lesion. Sinuses/Orbits: Visualized orbits demonstrate no acute abnormality. Mild ethmoid sinus mucosal thickening. Small bilateral mastoid effusions. MRA HEAD FINDINGS The intracranial internal carotid arteries are patent. Mild atherosclerotic irregularity of these vessels without significant stenosis. The right middle cerebral artery is patent without significant proximal stenosis. The right anterior cerebral artery is patent. Mild-to-moderate focal stenosis at the origin of the right anterior cerebral artery. The M1 left middle cerebral artery is patent without significant stenosis. High-grade focal stenosis within  a superior division proximal M2 left middle cerebral artery branch shortly beyond its origin (series 1018, image 13). There is also segmental near occlusive or occlusive stenosis within a proximal left M2 branch more distally. The left anterior cerebral artery is patent without significant proximal stenosis. The intracranial vertebral arteries are patent without significant stenosis. The left vertebral artery is developmentally diminutive beyond the origin of the left PICA. The basilar artery is patent without significant stenosis. Predominantly fetal origin of the bilateral posterior cerebral arteries, which are patent without significant proximal stenosis. No intracranial aneurysm is identified. IMPRESSION: MRI brain: 1. Acute/early subacute infarct involving the anterolateral left frontal lobe, lateral left basal ganglia and anterior left insula with associated petechial hemorrhage, as described. 2. Mild associated mass effect with minimal effacement of the left lateral ventricle frontal horn. 3. Generalized parenchymal atrophy with moderate chronic small vessel ischemic disease. Chronic right cerebellar lacunar infarct. MRA head: 1. High-grade focal stenosis within a superior division proximal M2 left MCA branch shortly beyond its origin. There is also  segmental near occlusive or occlusive stenosis within a proximal left M2 branch more distally. 2. Moderate focal stenosis at the origin of the right anterior right anterior cerebral artery. 3. Additional intracranial atherosclerotic disease as described with no other significant proximal arterial stenosis identified. Electronically Signed   By: Kellie Simmering DO   On: 11/17/2019 08:19   DG Chest Port 1 View  Result Date: 11/18/2019 CLINICAL DATA:  86 year old with weakness and altered mental status. EXAM: PORTABLE CHEST 1 VIEW COMPARISON:  Radiograph 11/08/2019 FINDINGS: Low lung volumes. Borderline cardiomegaly unchanged from prior exam. Unchanged mediastinal contours. Coarse interstitial markings particularly at the lung bases, not significantly changed. No new airspace disease. No pleural fluid or pneumothorax. IMPRESSION: No acute findings.Unchanged coarse interstitial markings at the lung bases appear chronic. Electronically Signed   By: Keith Rake M.D.   On: 11/24/2019 23:17   ECHOCARDIOGRAM COMPLETE  Result Date: 11/17/2019   ECHOCARDIOGRAM REPORT   Patient Name:   [name] Date of Exam: 11/17/2019 Medical Rec #:  [id number]     Height:       62.0 in Accession #:    [id number]    Weight:       163.4 lb Date of Birth:  August 14, 1927     BSA:          1.75 m Patient Age:    86 years      BP:           169/72 mmHg Patient Gender: F             HR:           75 bpm. Exam Location:  Inpatient Procedure: 2D Echo Indications:    Stroke 434.91 / I163.9  History:        Patient has prior history of Echocardiogram examinations, most                 recent 02/23/2018. CAD, Signs/Symptoms:dementia; Risk                 Factors:Hypertension and Dyslipidemia. Cannot walk or talk at                 this time.  Sonographer:    Darlina Sicilian RDCS Referring Phys: [id number] [facility]  Sonographer Comments: Unable to turn on her left side. IMPRESSIONS  1. Left ventricular ejection fraction, by visual  estimation, is 60 to 65%. The left ventricle has normal  function. Left ventricular septal wall thickness was mildly increased. There is no left ventricular hypertrophy.  2. Left ventricular diastolic parameters are consistent with Grade I diastolic dysfunction (impaired relaxation).  3. Global right ventricle has normal systolic function.The right ventricular size is normal. No increase in right ventricular wall thickness.  4. Left atrial size was normal.  5. Right atrial size was normal.  6. The mitral valve is normal in structure. Mild mitral valve regurgitation. No evidence of mitral stenosis.  7. The tricuspid valve is normal in structure. Tricuspid valve regurgitation is mild.  8. The aortic valve is normal in structure. Aortic valve regurgitation is trivial. No evidence of aortic valve sclerosis or stenosis.  9. The pulmonic valve was normal in structure. Pulmonic valve regurgitation is trivial. 10. Mildly elevated pulmonary artery systolic pressure. 11. The inferior vena cava is normal in size with greater than 50% respiratory variability, suggesting right atrial pressure of 3 mmHg. FINDINGS  Left Ventricle: Left ventricular ejection fraction, by visual estimation, is 60 to 65%. The left ventricle has normal function. The left ventricle is not well visualized. There is no left ventricular hypertrophy. Left ventricular diastolic parameters are consistent with Grade I diastolic dysfunction (impaired relaxation). Normal left atrial pressure. Right Ventricle: The right ventricular size is normal. No increase in right ventricular wall thickness. Global RV systolic function is has normal systolic function. The tricuspid regurgitant velocity is 2.63 m/s, and with an assumed right atrial pressure  of 3 mmHg, the estimated right ventricular systolic pressure is mildly elevated at 30.7 mmHg. Left Atrium: Left atrial size was normal in size. Right Atrium: Right atrial size was normal in size Pericardium: There is no  evidence of pericardial effusion. Mitral Valve: The mitral valve is normal in structure. Mild mitral valve regurgitation. No evidence of mitral valve stenosis by observation. Tricuspid Valve: The tricuspid valve is normal in structure. Tricuspid valve regurgitation is mild. Aortic Valve: The aortic valve is normal in structure. Aortic valve regurgitation is trivial. The aortic valve is structurally normal, with no evidence of sclerosis or stenosis. Pulmonic Valve: The pulmonic valve was normal in structure. Pulmonic valve regurgitation is trivial. Pulmonic regurgitation is trivial. Aorta: The aortic root, ascending aorta and aortic arch are all structurally normal, with no evidence of dilitation or obstruction. Venous: The inferior vena cava is normal in size with greater than 50% respiratory variability, suggesting right atrial pressure of 3 mmHg. IAS/Shunts: No atrial level shunt detected by color flow Doppler. There is no evidence of a patent foramen ovale. No ventricular septal defect is seen or detected. There is no evidence of an atrial septal defect.  LEFT VENTRICLE PLAX 2D LVIDd:         4.50 cm  Diastology LVIDs:         2.90 cm  LV e' lateral:   5.11 cm/s LV PW:         1.00 cm  LV E/e' lateral: 12.4 LV IVS:        1.20 cm  LV e' medial:    5.87 cm/s LVOT diam:     1.60 cm  LV E/e' medial:  10.8 LV SV:         60 ml LV SV Index:   32.98 LVOT Area:     2.01 cm  RIGHT VENTRICLE RV S prime:     20.20 cm/s TAPSE (M-mode): 2.8 cm LEFT ATRIUM             Index LA diam:  3.00 cm 1.71 cm/m LA Vol (A2C):   48.0 ml 27.36 ml/m LA Vol (A4C):   49.4 ml 28.16 ml/m LA Biplane Vol: 50.0 ml 28.50 ml/m  AORTIC VALVE LVOT Vmax:   76.40 cm/s LVOT Vmean:  46.200 cm/s LVOT VTI:    0.132 m  AORTA Ao Root diam: 2.30 cm Ao Asc diam:  3.10 cm MITRAL VALVE                         TRICUSPID VALVE MV Area (PHT): 2.22 cm              TR Peak grad:   27.7 mmHg MV PHT:        99.04 msec            TR Vmax:        263.00 cm/s  MV Decel Time: 342 msec MV E velocity: 63.60 cm/s  103 cm/s  SHUNTS MV A velocity: 102.00 cm/s 70.3 cm/s Systemic VTI:  0.13 m MV E/A ratio:  0.62        1.5       Systemic Diam: 1.60 cm  Armanda Magic MD Electronically signed by Armanda Magic MD Signature Date/Time: 11/17/2019/11:24:48 AM    Final    VAS [place] LOWER EXTREMITY VENOUS (DVT)  Result Date: 11/18/2019  Lower Venous Study Indications: Stroke.  Limitations: Memory impaired/ confusion. Performing Technologist: Jeb Levering RDMS, RVT  Examination Guidelines: A complete evaluation includes B-mode imaging, spectral Doppler, color Doppler, and power Doppler as needed of all accessible portions of each vessel. Bilateral testing is considered an integral part of a complete examination. Limited examinations for reoccurring indications may be performed as noted.  +---------+---------------+---------+-----------+----------+--------------+ RIGHT    CompressibilityPhasicitySpontaneityPropertiesThrombus Aging +---------+---------------+---------+-----------+----------+--------------+ CFV      Full           Yes      Yes                                 +---------+---------------+---------+-----------+----------+--------------+ SFJ      Full                                                        +---------+---------------+---------+-----------+----------+--------------+ FV Prox  Full                                                        +---------+---------------+---------+-----------+----------+--------------+ FV Mid   Full                                                        +---------+---------------+---------+-----------+----------+--------------+ FV DistalFull                                                        +---------+---------------+---------+-----------+----------+--------------+  PFV      Full                                                         +---------+---------------+---------+-----------+----------+--------------+ POP      Full           Yes      Yes                                 +---------+---------------+---------+-----------+----------+--------------+ PTV                                                   Not visualized +---------+---------------+---------+-----------+----------+--------------+ PERO                                                  Not visualized +---------+---------------+---------+-----------+----------+--------------+ Not all segments well visualized due to leg turned medially/ patient movement  +---------+---------------+---------+-----------+----------+--------------+ LEFT     CompressibilityPhasicitySpontaneityPropertiesThrombus Aging +---------+---------------+---------+-----------+----------+--------------+ CFV      Full           Yes      Yes                                 +---------+---------------+---------+-----------+----------+--------------+ SFJ      Full                                                        +---------+---------------+---------+-----------+----------+--------------+ FV Prox  Full                                                        +---------+---------------+---------+-----------+----------+--------------+ FV Mid   Full                                                        +---------+---------------+---------+-----------+----------+--------------+ FV DistalFull                                                        +---------+---------------+---------+-----------+----------+--------------+ PFV      Full                                                        +---------+---------------+---------+-----------+----------+--------------+  POP      Full           Yes      Yes                                 +---------+---------------+---------+-----------+----------+--------------+ PTV      Full                                                         +---------+---------------+---------+-----------+----------+--------------+ PERO     Full                                                        +---------+---------------+---------+-----------+----------+--------------+     Summary: Right: There is no evidence of deep vein thrombosis in the lower extremity. However, portions of this examination were limited- see technologist comments above. No cystic structure found in the popliteal fossa. Left: There is no evidence of deep vein thrombosis in the lower extremity. No cystic structure found in the popliteal fossa.  *See table(s) above for measurements and observations. Electronically signed by Coral Else MD on 11/18/2019 at 4:39:52 PM.    Final     Microbiology Recent Results (from the past 240 hour(s))  SARS CORONAVIRUS 2 (TAT 6-24 HRS) Nasopharyngeal Nasopharyngeal Swab     Status: None   Collection Time: 11/19/19  3:29 PM   Specimen: Nasopharyngeal Swab  Result Value Ref Range Status   SARS Coronavirus 2 NEGATIVE NEGATIVE Final    Comment: (NOTE) SARS-CoV-2 target nucleic acids are NOT DETECTED. The SARS-CoV-2 RNA is generally detectable in upper and lower respiratory specimens during the acute phase of infection. Negative results do not preclude SARS-CoV-2 infection, do not rule out co-infections with other pathogens, and should not be used as the sole basis for treatment or other patient management decisions. Negative results must be combined with clinical observations, patient history, and epidemiological information. The expected result is Negative. Fact Sheet for Patients: HairSlick.no Fact Sheet for Healthcare Providers: quierodirigir.com This test is not yet approved or cleared by the [place] FDA and  has been authorized for detection and/or diagnosis of SARS-CoV-2 by FDA under an Emergency Use Authorization (EUA). This EUA will remain   in effect (meaning this test can be used) for the duration of the COVID-19 declaration under Section 56 4(b)(1) of the Act, 21 U.S.C. section 360bbb-3(b)(1), unless the authorization is terminated or revoked sooner. Performed at [facility], [address], [place], [place] [address]     Lab Basic Metabolic Panel: No results for input(s): NA, K, CL, CO2, GLUCOSE, BUN, CREATININE, CALCIUM, MG, PHOS in the last 168 hours. Liver Function Tests: No results for input(s): AST, ALT, ALKPHOS, BILITOT, PROT, ALBUMIN in the last 168 hours. No results for input(s): LIPASE, AMYLASE in the last 168 hours. No results for input(s): AMMONIA in the last 168 hours. CBC: No results for input(s): WBC, NEUTROABS, HGB, HCT, MCV, PLT in the last 168 hours. Cardiac Enzymes: No results for input(s): CKTOTAL, CKMB, CKMBINDEX, TROPONINI in the last 168 hours. Sepsis Labs: No results for input(s): PROCALCITON, WBC, LATICACIDVEN  in the last 168 hours.  Procedures/Operations    12/18: Transthoracic Echocardiogram IMPRESSIONS   1. Left ventricular ejection fraction, by visual estimation, is 60 to 65%. The left ventricle has normal function. Left ventricular septal wall thickness was mildly increased. There is no left ventricular hypertrophy. 2. Left ventricular diastolic parameters are consistent with Grade I diastolic dysfunction (impaired relaxation). 3. Global right ventricle has normal systolic function.The right ventricular size is normal. No increase in right ventricular wall thickness. 4. Left atrial size was normal. 5. Right atrial size was normal. 6. The mitral valve is normal in structure. Mild mitral valve regurgitation. No evidence of mitral stenosis. 7. The tricuspid valve is normal in structure. Tricuspid valve regurgitation is mild. 8. The aortic valve is normal in structure. Aortic valve regurgitation is trivial. No evidence of aortic valve sclerosis or stenosis. 9. The  pulmonic valve was normal in structure. Pulmonic valve regurgitation is trivial. 10. Mildly elevated pulmonary artery systolic pressure. 11. The inferior vena cava is normal in size with greater than 50% respiratory variability, suggesting right atrial pressure of 3 mmHg.   Jacquelin Hawking 11/28/2019, 6:04 PM

## 2019-12-01 NOTE — Progress Notes (Signed)
Daughter called nurse to bedside at approximately 1350 to check on patient. Nurse walked into room and found patient with decreased respirations and pale. Nurse checked with 2 nurses, patient had a weak carotid pulse and heart beat. Nurse told daughter to call any family to bedside. At 1430, Daughter called patient back into room to check patient. Nurse checked with 2 nurses and patient was pronounced deceased at 1435. Patient's daughter, daughter's husband, and patient's grandson were at bedside. [place]

## 2019-12-01 NOTE — Progress Notes (Signed)
Patient ID: [name], female   DOB: 08-Apr-1927, 86 y.o.   MRN: [id number]  This NP visited patient at the bedside as a follow up to  yesterday's [place], for palliative medicine needs and emotional support.  Patient is dyspneic and warm to touch, she appears generally uncomfortable.  I spoke to the patient's daughter by phone and discussed adjustments to medications for symptom management.  Focus of care is comfort and dignity.  Plan of care -DNR/DNI -No artificial feeding now or in the future -No life prolonging measures focus on comfort and allowing natural death -Morphine 2 mg IV every 1 hour as needed for pain or dyspnea/Ativan 1 mg IV every 4 hours as needed for agitation/Tylenol suppository for fever -Discussed possibility of initiating morphine drip depending on patient's response to above recommendations  Discussed with daughter that the patient appears to be transitioning at end of life.  Prognosis is likely hours to days.  We discussed the likelihood that the patient would have a hospital death but if it made sense in the next days to come we could consider residential hospice.  Discussed the natural trajectory and expectations at end of life.  Emotional support offered  Questions and concerns addressed   Discussed with Dr Lonny Prude and bedside RN  Total time spent on the unit was 35 minutes  Greater than 50% of the time was spent in counseling and coordination of care  Wadie Lessen NP  Palliative Medicine Team Team Phone # [phone] Pager [phone]

## 2019-12-01 DEATH — deceased
